# Patient Record
Sex: Female | Born: 1975
Health system: Southern US, Community
[De-identification: ages and names within clinical notes are randomized; demographics above are authoritative.]

## PROBLEM LIST (undated history)

## (undated) ENCOUNTER — Emergency Department (HOSPITAL_COMMUNITY): Discharge status: Absent without leave | Payer: Self-pay

## (undated) DIAGNOSIS — F329 Major depressive disorder, single episode, unspecified: Secondary | ICD-10-CM

## (undated) DIAGNOSIS — K5909 Other constipation: Secondary | ICD-10-CM

## (undated) DIAGNOSIS — F909 Attention-deficit hyperactivity disorder, unspecified type: Secondary | ICD-10-CM

## (undated) DIAGNOSIS — H8109 Meniere's disease, unspecified ear: Secondary | ICD-10-CM

## (undated) DIAGNOSIS — E559 Vitamin D deficiency, unspecified: Secondary | ICD-10-CM

## (undated) DIAGNOSIS — K589 Irritable bowel syndrome without diarrhea: Secondary | ICD-10-CM

## (undated) DIAGNOSIS — F32A Depression, unspecified: Secondary | ICD-10-CM

## (undated) DIAGNOSIS — F419 Anxiety disorder, unspecified: Secondary | ICD-10-CM

## (undated) HISTORY — DX: Anxiety disorder, unspecified: F41.9

## (undated) HISTORY — DX: Irritable bowel syndrome without diarrhea: K58.9

## (undated) HISTORY — PX: DILATION AND CURETTAGE OF UTERUS: SHX78

## (undated) HISTORY — DX: Depression, unspecified: F32.A

## (undated) HISTORY — DX: Major depressive disorder, single episode, unspecified: F32.9

## (undated) HISTORY — DX: Meniere's disease, unspecified ear: H81.09

## (undated) HISTORY — PX: ABLATION: SHX5711

## (undated) HISTORY — DX: Vitamin D deficiency, unspecified: E55.9

---

## 2006-05-12 ENCOUNTER — Inpatient Hospital Stay: Payer: Self-pay

## 2009-03-26 ENCOUNTER — Observation Stay: Payer: Self-pay | Admitting: Obstetrics and Gynecology

## 2010-08-21 ENCOUNTER — Ambulatory Visit: Payer: Self-pay | Admitting: Internal Medicine

## 2010-09-07 ENCOUNTER — Inpatient Hospital Stay: Payer: Self-pay

## 2011-10-12 ENCOUNTER — Ambulatory Visit: Payer: Self-pay | Admitting: Internal Medicine

## 2011-10-12 LAB — URINALYSIS, COMPLETE
Bilirubin,UR: NEGATIVE
Blood: NEGATIVE
Ketone: NEGATIVE
Leukocyte Esterase: NEGATIVE
Nitrite: NEGATIVE
Ph: 7.5 (ref 4.5–8.0)
RBC,UR: NONE SEEN /HPF (ref 0–5)

## 2011-10-13 LAB — URINE CULTURE

## 2013-11-16 ENCOUNTER — Observation Stay: Payer: Self-pay | Admitting: Obstetrics and Gynecology

## 2013-11-21 ENCOUNTER — Inpatient Hospital Stay: Payer: Self-pay | Admitting: Obstetrics and Gynecology

## 2013-11-21 LAB — CBC WITH DIFFERENTIAL/PLATELET
COMMENT - H1-COM2: NORMAL
Comment - H1-Com1: NORMAL
HCT: 39.5 % (ref 35.0–47.0)
HGB: 13.3 g/dL (ref 12.0–16.0)
Lymphocytes: 24 %
MCH: 29.6 pg (ref 26.0–34.0)
MCHC: 33.6 g/dL (ref 32.0–36.0)
MCV: 88 fL (ref 80–100)
MONOS PCT: 6 %
Platelet: 121 10*3/uL — ABNORMAL LOW (ref 150–440)
RBC: 4.49 10*6/uL (ref 3.80–5.20)
RDW: 13.5 % (ref 11.5–14.5)
Segmented Neutrophils: 70 %
WBC: 8.8 10*3/uL (ref 3.6–11.0)

## 2013-11-22 LAB — HEMATOCRIT: HCT: 38 % (ref 35.0–47.0)

## 2014-12-03 NOTE — H&P (Signed)
L&D Evaluation:  History:  HPI 39 yo [id number] at 7673w3d by D=7wk US derived EDC of 11/20/13 presenting with left lower extremity swelling for the last week.  No trauma to the affected extremity.  No pain, but numbness.  No prior history of DVT.    +FM, no LOF, no VB, no ctx   Presents with left lower extremity swelling   Patient's Medical History No Chronic Illness   Patient's Surgical History D&C   Medications Pre Natal Vitamins   Allergies NKDA   Social History none   Family History Non-Contributory   ROS:  ROS All systems were reviewed.  HEENT, CNS, GI, GU, Respiratory, CV, Renal and Musculoskeletal systems were found to be normal.   Exam:  Vital Signs stable   Urine Protein not completed   General no apparent distress   Edema 1+   FHT normal rate with no decels, reactive NST   Ucx absent   Other Symmetric BLE swelling, +1, negative homans sign bilaterally, some superficial varicosities RLE, no cords bilateral lower extremities   Impression:  Impression 39 yo [id number] at 4273w3d with LLE swelling   Plan:  Comments 1) LLE swelling - low concern for DVT on exam but will obtain lower extremity dopplers  2) Fetus - category I tracing      - 58lbs weight gain this pregnancy      - pelvis tested to 7lbs 11oz  3) A negative / ABSC neg / RI / VZI / HBsAg neg / HIV neg / RPR NR / XY no aneuoploid on cell free fetal DNA / 1-hr 109 / GBS negative     - rhogam 08/31/13  4) TDAP 09/14/13  5) Disposition - scheduled for IOL on 11/21/13, home if doppler negative   Electronic Signatures for Addendum Section:  Lorrene ReidStaebler, Le Faulcon M (MD) (Signed Addendum 24-Apr-15 11:39)  lower extremity doppler negative discharge home with knee high TED hose   Electronic Signatures: Lorrene ReidStaebler, Arrie Zuercher M (MD)  (Signed 24-Apr-15 11:07)  Authored: L&D Evaluation   Last Updated: 24-Apr-15 11:39 by Lorrene ReidStaebler, Raymonde Hamblin M (MD)

## 2014-12-03 NOTE — H&P (Signed)
L&D Evaluation:  History:   HPI 39 yo G5 P2022 at 40.2 weeks with EDD of 2/12 per LMP & 1st trimester US. For IOL d/t postdates. Stated cervix was closed today in the office. Orders sent for cervidil this evening and pitocin in am. [facility] notable for h/o marginal previa which had resolved by 28 week US, Rh negative - received Rhogam at 28 weeks, and AMA. Pt declined genetic screening.    Patient's Medical History No Chronic Illness    Patient's Surgical History none    Medications Pre Natal Vitamins    Allergies NKDA    Social History none    Family History Non-Contributory   ROS:    General normal    HEENT nasal congestion    CNS normal    GI normal    GU normal    Resp normal    CV normal    Renal normal    MS normal   Exam:   Vital Signs stable    Urine Protein not completed    General no apparent distress    Mental Status clear    Chest clear    Heart normal sinus rhythm    Abdomen gravid, non-tender    Estimated Fetal Weight Average for gestational age    Fetal Position vertex    Edema no edema  varicosities to left leg    Reflexes 1+     Pelvic no external lesions    Mebranes Intact    FHT normal rate with no decels    Fetal Heart Rate 135    Ucx irregular    Skin dry   Impression:   Impression reactive NST   Plan:   Plan EFM/NST, Cervidil overnight. Will also order Sudafed for nasal congestion   Electronic Signatures: Vella KohlerBrothers, Jacqueline Howell (CNM)  (Signed 13-Feb-12 20:33)  Entered: L&D Evaluation,  Authored: L&D Evaluation  Last Updated: 13-Feb-12 20:33

## 2015-01-13 ENCOUNTER — Other Ambulatory Visit: Payer: Self-pay | Admitting: Family Medicine

## 2015-01-13 DIAGNOSIS — F909 Attention-deficit hyperactivity disorder, unspecified type: Secondary | ICD-10-CM

## 2015-01-13 DIAGNOSIS — F419 Anxiety disorder, unspecified: Secondary | ICD-10-CM

## 2015-01-13 MED ORDER — CLONAZEPAM 1 MG PO TABS: 1.0000 mg | ORAL_TABLET | Freq: Every day | ORAL | Status: DC

## 2015-01-13 MED ORDER — AMPHETAMINE-DEXTROAMPHET ER 10 MG PO CP24: 10.0000 mg | ORAL_CAPSULE | Freq: Every day | ORAL | Status: DC

## 2015-01-13 MED ORDER — AMPHETAMINE-DEXTROAMPHETAMINE 10 MG PO TABS: 10.0000 mg | ORAL_TABLET | Freq: Every day | ORAL | Status: DC

## 2015-02-07 ENCOUNTER — Other Ambulatory Visit: Payer: Self-pay | Admitting: Neurosurgery

## 2015-02-07 DIAGNOSIS — M5416 Radiculopathy, lumbar region: Secondary | ICD-10-CM

## 2015-02-11 ENCOUNTER — Other Ambulatory Visit: Payer: Self-pay | Admitting: Family Medicine

## 2015-02-11 MED ORDER — AMPHETAMINE-DEXTROAMPHETAMINE 10 MG PO TABS: 10.0000 mg | ORAL_TABLET | Freq: Every day | ORAL | Status: DC

## 2015-02-11 MED ORDER — CLONAZEPAM 1 MG PO TABS: 1.0000 mg | ORAL_TABLET | Freq: Every day | ORAL | Status: DC

## 2015-02-11 MED ORDER — AMPHETAMINE-DEXTROAMPHET ER 10 MG PO CP24: 10.0000 mg | ORAL_CAPSULE | Freq: Every day | ORAL | Status: DC

## 2015-02-25 ENCOUNTER — Ambulatory Visit
Admission: RE | Admit: 2015-02-25 | Discharge: 2015-02-25 | Disposition: A | Discharge status: Home / Self Care | Payer: BLUE CROSS/BLUE SHIELD | Source: Ambulatory Visit | Attending: Neurosurgery | Admitting: Neurosurgery

## 2015-02-25 DIAGNOSIS — M5416 Radiculopathy, lumbar region: Secondary | ICD-10-CM | POA: Insufficient documentation

## 2015-02-25 DIAGNOSIS — M5136 Other intervertebral disc degeneration, lumbar region: Secondary | ICD-10-CM | POA: Insufficient documentation

## 2015-03-10 ENCOUNTER — Other Ambulatory Visit: Payer: Self-pay | Admitting: Family Medicine

## 2015-03-10 MED ORDER — AMPHETAMINE-DEXTROAMPHET ER 10 MG PO CP24: 10.0000 mg | ORAL_CAPSULE | Freq: Every day | ORAL | Status: DC

## 2015-03-10 MED ORDER — CLONAZEPAM 1 MG PO TABS: 1.0000 mg | ORAL_TABLET | Freq: Every day | ORAL | Status: DC

## 2015-03-10 MED ORDER — AMPHETAMINE-DEXTROAMPHETAMINE 10 MG PO TABS: 10.0000 mg | ORAL_TABLET | Freq: Every day | ORAL | Status: DC

## 2015-03-27 ENCOUNTER — Other Ambulatory Visit: Payer: Self-pay | Admitting: Family Medicine

## 2015-03-27 MED ORDER — AMPHETAMINE-DEXTROAMPHETAMINE 10 MG PO TABS: 10.0000 mg | ORAL_TABLET | Freq: Every day | ORAL | Status: DC

## 2015-03-27 MED ORDER — CLONAZEPAM 1 MG PO TABS: 1.0000 mg | ORAL_TABLET | Freq: Every day | ORAL | Status: DC

## 2015-03-27 MED ORDER — AMPHETAMINE-DEXTROAMPHET ER 10 MG PO CP24: 10.0000 mg | ORAL_CAPSULE | Freq: Every day | ORAL | Status: DC

## 2015-04-29 ENCOUNTER — Ambulatory Visit (INDEPENDENT_AMBULATORY_CARE_PROVIDER_SITE_OTHER): Payer: BLUE CROSS/BLUE SHIELD

## 2015-04-29 DIAGNOSIS — Z23 Encounter for immunization: Secondary | ICD-10-CM | POA: Diagnosis not present

## 2015-05-05 ENCOUNTER — Other Ambulatory Visit: Payer: Self-pay | Admitting: Family Medicine

## 2015-05-05 MED ORDER — AMPHETAMINE-DEXTROAMPHETAMINE 10 MG PO TABS: 10.0000 mg | ORAL_TABLET | Freq: Every day | ORAL | Status: DC

## 2015-05-05 MED ORDER — CLONAZEPAM 1 MG PO TABS: 1.0000 mg | ORAL_TABLET | Freq: Every day | ORAL | Status: DC

## 2015-05-05 MED ORDER — AMPHETAMINE-DEXTROAMPHET ER 10 MG PO CP24: 10.0000 mg | ORAL_CAPSULE | Freq: Every day | ORAL | Status: DC

## 2015-06-03 ENCOUNTER — Encounter: Payer: Self-pay | Admitting: Family Medicine

## 2015-06-03 ENCOUNTER — Other Ambulatory Visit: Payer: Self-pay | Admitting: Family Medicine

## 2015-06-03 ENCOUNTER — Ambulatory Visit (INDEPENDENT_AMBULATORY_CARE_PROVIDER_SITE_OTHER): Payer: BLUE CROSS/BLUE SHIELD | Admitting: Family Medicine

## 2015-06-03 VITALS — BP 108/72 | HR 64 | Temp 98.0°F | Ht 66.2 in | Wt 129.0 lb

## 2015-06-03 DIAGNOSIS — F909 Attention-deficit hyperactivity disorder, unspecified type: Secondary | ICD-10-CM

## 2015-06-03 DIAGNOSIS — M545 Low back pain, unspecified: Secondary | ICD-10-CM

## 2015-06-03 DIAGNOSIS — F419 Anxiety disorder, unspecified: Secondary | ICD-10-CM | POA: Diagnosis not present

## 2015-06-03 MED ORDER — AMPHETAMINE-DEXTROAMPHET ER 10 MG PO CP24: 10.0000 mg | ORAL_CAPSULE | Freq: Every day | ORAL | Status: DC

## 2015-06-03 MED ORDER — LIDOCAINE 5 % EX PTCH: 1.0000 | MEDICATED_PATCH | CUTANEOUS | Status: DC

## 2015-06-03 MED ORDER — CLONAZEPAM 1 MG PO TABS: 1.0000 mg | ORAL_TABLET | Freq: Every day | ORAL | Status: DC

## 2015-06-03 MED ORDER — AMPHETAMINE-DEXTROAMPHETAMINE 10 MG PO TABS: 10.0000 mg | ORAL_TABLET | Freq: Every day | ORAL | Status: DC

## 2015-06-03 NOTE — Progress Notes (Signed)
BP 108/72 mmHg  Pulse 64  Temp(Src) 98 F (36.7 C)  Ht 5' 6.2" (1.681 m)  Wt 129 lb (58.514 kg)  BMI 20.71 kg/m2  SpO2 99%  LMP 05/13/2015 (Approximate)   Subjective:    Patient ID: Jacqueline Howell, female    DOB: [birth year], 39 y.o.   MRN: [id number]  HPI: Jacqueline Howell is a 39 y.o. female  Chief Complaint  Patient presents with  . Medication Refill   Patient for recheck ADHD doing well with medications occasionally doesn't take any Klonopin occasionally will take 2 depending on the week and kids. Patient also has had some low back pain discomfort is treated by neurosurgery with MRI showing some mild degenerative changes Patient was given lidocaine patch which helps Tylenol arthritis bothers her stomach. Relevant past medical, surgical, family and social history reviewed and updated as indicated. Interim medical history since our last visit reviewed. Allergies and medications reviewed and updated.  Review of Systems  Constitutional: Negative.   Respiratory: Negative.   Cardiovascular: Negative.     Per HPI unless specifically indicated above     Objective:    BP 108/72 mmHg  Pulse 64  Temp(Src) 98 F (36.7 C)  Ht 5' 6.2" (1.681 m)  Wt 129 lb (58.514 kg)  BMI 20.71 kg/m2  SpO2 99%  LMP 05/13/2015 (Approximate)  Wt Readings from Last 3 Encounters:  06/03/15 129 lb (58.514 kg)  12/04/14 132 lb (59.875 kg)  02/25/15 130 lb (58.968 kg)    Physical Exam  Constitutional: She is oriented to person, place, and time. She appears well-developed and well-nourished. No distress.  HENT:  Head: Normocephalic and atraumatic.  Right Ear: Hearing normal.  Left Ear: Hearing normal.  Nose: Nose normal.  Eyes: Conjunctivae and lids are normal. Right eye exhibits no discharge. Left eye exhibits no discharge. No scleral icterus.  Cardiovascular: Normal rate, regular rhythm and normal heart sounds.   Pulmonary/Chest: Effort normal and breath sounds normal. No respiratory  distress.  Musculoskeletal: Normal range of motion.  Neurological: She is alert and oriented to person, place, and time.  Skin: Skin is intact. No rash noted.  Psychiatric: She has a normal mood and affect. Her speech is normal and behavior is normal. Judgment and thought content normal. Cognition and memory are normal.    Results for orders placed or performed in visit on 11/21/13  CBC with Differential/Platelet  Result Value Ref Range   WBC 8.8 3.6-11.0 x10 3/mm 3   RBC 4.49 3.80-5.20 X10 6/mm 3   HGB 13.3 12.0-16.0 g/dL   HCT 40.939.5 81.1-91.435.0-47.0 %   MCV 88 80-100 fL   MCH 29.6 26.0-34.0 pg   MCHC 33.6 32.0-36.0 g/dL   RDW 78.213.5 95.6-21.311.5-14.5 %   Platelet 121 (L) 150-440 x10 3/mm 3   Segmented Neutrophils 70 %   Lymphocytes 24 %   Monocytes 6 %   Comment - H1-Com1 RBCs APPEAR NORMAL    Comment - H1-Com2 NORMAL PLT MORPHOLGY   Hematocrit  Result Value Ref Range   HCT 38.0 35.0-47.0 %      Assessment & Plan:   Problem List Items Addressed This Visit      Other   Low back pain    Discuss back pain care and treatment will continue lidocaine patch intermittently as that is helped Discussed posture and exercise      Chronic anxiety (Chronic)    Stable with intermittent clonazepam use      ADHD (attention deficit hyperactivity  disorder) - Primary (Chronic)    Patient well controlled on current medications          Follow up plan: Return in about 3 months (around 09/03/2015) for Recheck medicines.

## 2015-06-03 NOTE — Assessment & Plan Note (Signed)
Patient well controlled on current medications

## 2015-06-03 NOTE — Assessment & Plan Note (Signed)
Stable with intermittent clonazepam use

## 2015-06-03 NOTE — Assessment & Plan Note (Signed)
Discuss back pain care and treatment will continue lidocaine patch intermittently as that is helped Discussed posture and exercise

## 2015-06-18 ENCOUNTER — Other Ambulatory Visit: Payer: Self-pay | Admitting: Family Medicine

## 2015-06-18 MED ORDER — AMPHETAMINE-DEXTROAMPHETAMINE 10 MG PO TABS: 10.0000 mg | ORAL_TABLET | Freq: Every day | ORAL | Status: DC

## 2015-06-18 MED ORDER — CLONAZEPAM 1 MG PO TABS: 1.0000 mg | ORAL_TABLET | Freq: Every day | ORAL | Status: DC

## 2015-06-18 MED ORDER — AMPHETAMINE-DEXTROAMPHET ER 10 MG PO CP24: 10.0000 mg | ORAL_CAPSULE | Freq: Every day | ORAL | Status: DC

## 2015-07-09 ENCOUNTER — Ambulatory Visit: Payer: BLUE CROSS/BLUE SHIELD | Admitting: Family Medicine

## 2015-07-30 ENCOUNTER — Other Ambulatory Visit: Payer: Self-pay | Admitting: Family Medicine

## 2015-07-30 MED ORDER — AMPHETAMINE-DEXTROAMPHETAMINE 10 MG PO TABS: 10.0000 mg | ORAL_TABLET | Freq: Every day | ORAL | Status: DC

## 2015-07-30 MED ORDER — AMPHETAMINE-DEXTROAMPHET ER 10 MG PO CP24: 10.0000 mg | ORAL_CAPSULE | Freq: Every day | ORAL | Status: DC

## 2015-08-25 ENCOUNTER — Other Ambulatory Visit: Payer: Self-pay | Admitting: Family Medicine

## 2015-08-25 MED ORDER — AMPHETAMINE-DEXTROAMPHETAMINE 10 MG PO TABS: 10.0000 mg | ORAL_TABLET | Freq: Every day | ORAL | Status: DC

## 2015-08-25 MED ORDER — AMPHETAMINE-DEXTROAMPHET ER 10 MG PO CP24: 10.0000 mg | ORAL_CAPSULE | Freq: Every day | ORAL | Status: DC

## 2015-08-25 MED ORDER — CLONAZEPAM 1 MG PO TABS: 1.0000 mg | ORAL_TABLET | Freq: Every day | ORAL | Status: DC

## 2015-08-28 ENCOUNTER — Ambulatory Visit: Payer: BLUE CROSS/BLUE SHIELD | Admitting: Family Medicine

## 2015-09-01 ENCOUNTER — Encounter: Payer: Self-pay | Admitting: Family Medicine

## 2015-09-01 ENCOUNTER — Ambulatory Visit (INDEPENDENT_AMBULATORY_CARE_PROVIDER_SITE_OTHER): Payer: BLUE CROSS/BLUE SHIELD | Admitting: Family Medicine

## 2015-09-01 VITALS — BP 109/69 | HR 66 | Temp 99.1°F | Ht 66.2 in | Wt 133.0 lb

## 2015-09-01 DIAGNOSIS — F909 Attention-deficit hyperactivity disorder, unspecified type: Secondary | ICD-10-CM | POA: Diagnosis not present

## 2015-09-01 DIAGNOSIS — F419 Anxiety disorder, unspecified: Secondary | ICD-10-CM | POA: Diagnosis not present

## 2015-09-01 NOTE — Progress Notes (Signed)
BP 109/69 mmHg  Pulse 66  Temp(Src) 99.1 F (37.3 C)  Ht 5' 6.2" (1.681 m)  Wt 133 lb (60.328 kg)  BMI 21.35 kg/m2  SpO2 97%  LMP 08/25/2015 (Approximate)   Subjective:    Patient ID: Jacqueline Howell, female    DOB: 1976-07-21, 40 y.o.   MRN: [id number]  HPI: Jacqueline Howell is a 40 y.o. female  Chief Complaint  Patient presents with  . ADHD    med refill   patient all in all doing well with a lot of family stress with family illness which is all getting on the mend Patient and medications as were lost in child's use of a bag and they were in for throwing up. Patient's done okay Anxiety stable and improving with children getting better and has been surgery recovery ADHD has been stable when taking medications  Relevant past medical, surgical, family and social history reviewed and updated as indicated. Interim medical history since our last visit reviewed. Allergies and medications reviewed and updated.  Review of Systems  Constitutional: Negative.   Respiratory: Negative.   Cardiovascular: Negative.     Per HPI unless specifically indicated above     Objective:    BP 109/69 mmHg  Pulse 66  Temp(Src) 99.1 F (37.3 C)  Ht 5' 6.2" (1.681 m)  Wt 133 lb (60.328 kg)  BMI 21.35 kg/m2  SpO2 97%  LMP 08/25/2015 (Approximate)  Wt Readings from Last 3 Encounters:  09/01/15 133 lb (60.328 kg)  06/03/15 129 lb (58.514 kg)  12/04/14 132 lb (59.875 kg)    Physical Exam  Constitutional: She is oriented to person, place, and time. She appears well-developed and well-nourished. No distress.  HENT:  Head: Normocephalic and atraumatic.  Right Ear: Hearing normal.  Left Ear: Hearing normal.  Nose: Nose normal.  Eyes: Conjunctivae and lids are normal. Right eye exhibits no discharge. Left eye exhibits no discharge. No scleral icterus.  Cardiovascular: Normal rate, regular rhythm and normal heart sounds.   Pulmonary/Chest: Effort normal. No respiratory distress.   Musculoskeletal: Normal range of motion.  Neurological: She is alert and oriented to person, place, and time.  Skin: Skin is intact. No rash noted.  Psychiatric: She has a normal mood and affect. Her speech is normal and behavior is normal. Judgment and thought content normal. Cognition and memory are normal.    Results for orders placed or performed in visit on 11/21/13  CBC with Differential/Platelet  Result Value Ref Range   WBC 8.8 3.6-11.0 x10 3/mm 3   RBC 4.49 3.80-5.20 X10 6/mm 3   HGB 13.3 12.0-16.0 g/dL   HCT 40.9 81.1-91.4 %   MCV 88 80-100 fL   MCH 29.6 26.0-34.0 pg   MCHC 33.6 32.0-36.0 g/dL   RDW 78.2 95.6-21.3 %   Platelet 121 (L) 150-440 x10 3/mm 3   Segmented Neutrophils 70 %   Lymphocytes 24 %   Monocytes 6 %   Comment - H1-Com1 RBCs APPEAR NORMAL    Comment - H1-Com2 NORMAL PLT MORPHOLGY   Hematocrit  Result Value Ref Range   HCT 38.0 35.0-47.0 %      Assessment & Plan:   Problem List Items Addressed This Visit      Other   Chronic anxiety - Primary (Chronic)    The current medical regimen is effective;  continue present plan and medications.       ADHD (attention deficit hyperactivity disorder) (Chronic)    The current medical regimen is  effective;  continue present plan and medications.           Follow up plan: Return in about 3 months (around 11/29/2015) for med check`.

## 2015-09-01 NOTE — Assessment & Plan Note (Signed)
The current medical regimen is effective;  continue present plan and medications.  

## 2015-09-16 ENCOUNTER — Other Ambulatory Visit: Payer: Self-pay | Admitting: Family Medicine

## 2015-09-16 MED ORDER — AMPHETAMINE-DEXTROAMPHETAMINE 10 MG PO TABS: 10.0000 mg | ORAL_TABLET | Freq: Every day | ORAL | Status: DC

## 2015-09-16 MED ORDER — AMPHETAMINE-DEXTROAMPHET ER 10 MG PO CP24
10.0000 mg | ORAL_CAPSULE | Freq: Every day | ORAL | Status: DC
Start: 2015-09-16 — End: 2015-10-20

## 2015-09-16 MED ORDER — CLONAZEPAM 1 MG PO TABS: 1.0000 mg | ORAL_TABLET | Freq: Every day | ORAL | Status: DC

## 2015-10-20 ENCOUNTER — Other Ambulatory Visit: Payer: Self-pay | Admitting: Family Medicine

## 2015-10-20 MED ORDER — CLONAZEPAM 1 MG PO TABS: 1.0000 mg | ORAL_TABLET | Freq: Every day | ORAL | Status: DC

## 2015-10-20 MED ORDER — AMPHETAMINE-DEXTROAMPHET ER 10 MG PO CP24: 10.0000 mg | ORAL_CAPSULE | Freq: Every day | ORAL | Status: DC

## 2015-10-20 MED ORDER — AMPHETAMINE-DEXTROAMPHETAMINE 10 MG PO TABS: 10.0000 mg | ORAL_TABLET | Freq: Every day | ORAL | Status: DC

## 2015-11-07 ENCOUNTER — Telehealth: Payer: Self-pay | Admitting: Family Medicine

## 2015-11-07 DIAGNOSIS — M545 Low back pain, unspecified: Secondary | ICD-10-CM

## 2015-11-07 NOTE — Telephone Encounter (Signed)
Routing to provider  

## 2015-11-07 NOTE — Telephone Encounter (Signed)
Pt called stated she has arthritis in her lower back and it hurts severely. Wants to know if a cortosone injection may help relieve the pain. Please call pt back and advise. Thanks.

## 2015-11-10 ENCOUNTER — Other Ambulatory Visit: Payer: Self-pay | Admitting: Family Medicine

## 2015-11-10 MED ORDER — CLONAZEPAM 1 MG PO TABS: 1.0000 mg | ORAL_TABLET | Freq: Every day | ORAL | Status: DC

## 2015-11-10 MED ORDER — AMPHETAMINE-DEXTROAMPHETAMINE 10 MG PO TABS: 10.0000 mg | ORAL_TABLET | Freq: Every day | ORAL | Status: DC

## 2015-11-10 MED ORDER — AMPHETAMINE-DEXTROAMPHET ER 10 MG PO CP24: 10.0000 mg | ORAL_CAPSULE | Freq: Every day | ORAL | Status: DC

## 2015-11-10 NOTE — Telephone Encounter (Signed)
Called, no answer. Will try later.  

## 2015-11-10 NOTE — Telephone Encounter (Signed)
Call pt 

## 2015-11-20 ENCOUNTER — Ambulatory Visit (INDEPENDENT_AMBULATORY_CARE_PROVIDER_SITE_OTHER): Payer: BLUE CROSS/BLUE SHIELD | Admitting: Family Medicine

## 2015-11-20 ENCOUNTER — Ambulatory Visit: Payer: BLUE CROSS/BLUE SHIELD | Admitting: Family Medicine

## 2015-11-20 ENCOUNTER — Encounter: Payer: Self-pay | Admitting: Family Medicine

## 2015-11-20 VITALS — BP 110/73 | HR 70 | Temp 98.9°F | Ht 66.2 in | Wt 138.0 lb

## 2015-11-20 DIAGNOSIS — F909 Attention-deficit hyperactivity disorder, unspecified type: Secondary | ICD-10-CM | POA: Diagnosis not present

## 2015-11-20 DIAGNOSIS — M5137 Other intervertebral disc degeneration, lumbosacral region: Secondary | ICD-10-CM | POA: Diagnosis not present

## 2015-11-20 DIAGNOSIS — M5136 Other intervertebral disc degeneration, lumbar region: Secondary | ICD-10-CM | POA: Diagnosis not present

## 2015-11-20 DIAGNOSIS — F419 Anxiety disorder, unspecified: Secondary | ICD-10-CM

## 2015-11-20 MED ORDER — TRIAMCINOLONE ACETONIDE 0.1 % EX CREA: 1.0000 "application " | TOPICAL_CREAM | Freq: Two times a day (BID) | CUTANEOUS | Status: DC

## 2015-11-20 NOTE — Progress Notes (Signed)
BP 110/73 mmHg  Pulse 70  Temp(Src) 98.9 F (37.2 C)  Ht 5' 6.2" (1.681 m)  Wt 138 lb (62.596 kg)  BMI 22.15 kg/m2  SpO2 95%  LMP 11/13/2015 (Approximate)   Subjective:    Patient ID: Jacqueline Howell, female    DOB: [birth year], 40 y.o.   MRN: [id number]  HPI: Jacqueline Howell is a 41 y.o. female  Chief Complaint  Patient presents with  . Depression  . Rash    on back, started yesterday  Patient with patch itching rash on her back started yesterday with working out wearing a polyester shirt with pressure on her low back in area of rash. Now itching Patient also noticed with ADHD taking 10 mg X or doesn't seem to do as well has been on this dose for years. Reviewed chart patient was started by psychiatrist in [place] years ago.  Took an extra Adderall 10 mg for a total of 20 and did better wants to consider trying this medication increase.   Relevant past medical, surgical, family and social history reviewed and updated as indicated. Interim medical history since our last visit reviewed. Allergies and medications reviewed and updated.  Review of Systems  Constitutional: Negative.   Respiratory: Negative.   Cardiovascular: Negative.     Per HPI unless specifically indicated above     Objective:    BP 110/73 mmHg  Pulse 70  Temp(Src) 98.9 F (37.2 C)  Ht 5' 6.2" (1.681 m)  Wt 138 lb (62.596 kg)  BMI 22.15 kg/m2  SpO2 95%  LMP 11/13/2015 (Approximate)  Wt Readings from Last 3 Encounters:  11/20/15 138 lb (62.596 kg)  09/01/15 133 lb (60.328 kg)  06/03/15 129 lb (58.514 kg)    Physical Exam  Constitutional: She is oriented to person, place, and time. She appears well-developed and well-nourished. No distress.  HENT:  Head: Normocephalic and atraumatic.  Right Ear: Hearing normal.  Left Ear: Hearing normal.  Nose: Nose normal.  Eyes: Conjunctivae and lids are normal. Right eye exhibits no discharge. Left eye exhibits no discharge. No scleral icterus.   Cardiovascular: Normal rate, regular rhythm and normal heart sounds.   Pulmonary/Chest: Effort normal and breath sounds normal. No respiratory distress.  Musculoskeletal: Normal range of motion.  Neurological: She is alert and oriented to person, place, and time.  Skin: Skin is intact. No rash noted.  Psychiatric: She has a normal mood and affect. Her speech is normal and behavior is normal. Judgment and thought content normal. Cognition and memory are normal.    Results for orders placed or performed in visit on 11/21/13  CBC with Differential/Platelet  Result Value Ref Range   WBC 8.8 3.6-11.0 x10 3/mm 3   RBC 4.49 3.80-5.20 X10 6/mm 3   HGB 13.3 12.0-16.0 g/dL   HCT 40.9 81.1-91.4 %   MCV 88 80-100 fL   MCH 29.6 26.0-34.0 pg   MCHC 33.6 32.0-36.0 g/dL   RDW 78.2 95.6-21.3 %   Platelet 121 (L) 150-440 x10 3/mm 3   Segmented Neutrophils 70 %   Lymphocytes 24 %   Monocytes 6 %   Comment - H1-Com1 RBCs APPEAR NORMAL    Comment - H1-Com2 NORMAL PLT MORPHOLGY   Hematocrit  Result Value Ref Range   HCT 38.0 35.0-47.0 %      Assessment & Plan:   Problem List Items Addressed This Visit      Other   ADHD (attention deficit hyperactivity disorder) - Primary (Chronic)  ADHD sounds like poor control will do Tova testing on 10 mg around 8:00 in the morning patient will take an extra Adderall as soon as she finishes her test and follow-up for another Tova test approximately 1-1/2-2 hours later. We will then review results and determine proper dosing.      Chronic anxiety (Chronic)    Uses clonazepam mostly every day still has great deal of stress in her life          Follow up plan: Return in about 3 months (around 02/19/2016) for And follow-up Tova test in the next week or so..Marland Kitchen

## 2015-11-20 NOTE — Assessment & Plan Note (Signed)
ADHD sounds like poor control will do Tova testing on 10 mg around 8:00 in the morning patient will take an extra Adderall as soon as she finishes her test and follow-up for another Tova test approximately 1-1/2-2 hours later. We will then review results and determine proper dosing.

## 2015-11-20 NOTE — Assessment & Plan Note (Signed)
Uses clonazepam mostly every day still has great deal of stress in her life

## 2015-11-25 DIAGNOSIS — M5136 Other intervertebral disc degeneration, lumbar region: Secondary | ICD-10-CM | POA: Diagnosis not present

## 2015-11-27 ENCOUNTER — Ambulatory Visit (INDEPENDENT_AMBULATORY_CARE_PROVIDER_SITE_OTHER): Payer: BLUE CROSS/BLUE SHIELD | Admitting: Family Medicine

## 2015-11-27 ENCOUNTER — Encounter: Payer: Self-pay | Admitting: Family Medicine

## 2015-11-27 VITALS — BP 119/80 | HR 66 | Ht 66.0 in | Wt 138.8 lb

## 2015-11-27 DIAGNOSIS — F909 Attention-deficit hyperactivity disorder, unspecified type: Secondary | ICD-10-CM | POA: Diagnosis not present

## 2015-11-27 DIAGNOSIS — M545 Low back pain, unspecified: Secondary | ICD-10-CM

## 2015-11-27 DIAGNOSIS — Z1339 Encounter for screening examination for other mental health and behavioral disorders: Secondary | ICD-10-CM

## 2015-11-27 DIAGNOSIS — Z1389 Encounter for screening for other disorder: Principal | ICD-10-CM

## 2015-11-27 MED ORDER — AMPHETAMINE-DEXTROAMPHET ER 20 MG PO CP24: 20.0000 mg | ORAL_CAPSULE | ORAL | Status: DC

## 2015-11-27 NOTE — Assessment & Plan Note (Signed)
Patient's been to [facility] was given a TENS unit which is helped a great deal and controlling her back pain.

## 2015-11-27 NOTE — Assessment & Plan Note (Signed)
Repeat Tova test 10 mg with a ADHD score of -8.74 see copy for details Repeat Tova test on 20 mg ADHD score of +1.72 with normalization of all areas except commission errors quarter 3 and 4 see copy for details Will discontinue Adderall XR 10  place on Adderall XR 20.

## 2015-11-27 NOTE — Progress Notes (Signed)
   BP 119/80 mmHg  Pulse 66  Ht 5\' 6"  (1.676 m)  Wt 138 lb 12.8 oz (62.959 kg)  BMI 22.41 kg/m2  LMP 11/13/2015 (Approximate)   Subjective:    Patient ID: Jacqueline Howell, female    DOB: [birth year], 40 y.o.   MRN: [id number]  HPI: Jacqueline Howell is a [age] y.o. female  Chief Complaint  Patient presents with  . TOVA  Repeat Tova test on 10 mg and 20 mg  Relevant past medical, surgical, family and social history reviewed and updated as indicated. Interim medical history since our last visit reviewed. Allergies and medications reviewed and updated.  Review of Systems  Constitutional: Negative.   Respiratory: Negative.   Cardiovascular: Negative.     Per HPI unless specifically indicated above     Objective:    BP 119/80 mmHg  Pulse 66  Ht 5\' 6"  (1.676 m)  Wt 138 lb 12.8 oz (62.959 kg)  BMI 22.41 kg/m2  LMP 11/13/2015 (Approximate)  Wt Readings from Last 3 Encounters:  11/27/15 138 lb 12.8 oz (62.959 kg)  11/20/15 138 lb (62.596 kg)  09/01/15 133 lb (60.328 kg)    Physical Exam  Constitutional: She is oriented to person, place, and time. She appears well-developed and well-nourished. No distress.  HENT:  Head: Normocephalic and atraumatic.  Right Ear: Hearing normal.  Left Ear: Hearing normal.  Nose: Nose normal.  Eyes: Conjunctivae and lids are normal. Right eye exhibits no discharge. Left eye exhibits no discharge. No scleral icterus.  Pulmonary/Chest: Effort normal. No respiratory distress.  Musculoskeletal: Normal range of motion.  Neurological: She is alert and oriented to person, place, and time.  Skin: Skin is intact. No rash noted.  Psychiatric: She has a normal mood and affect. Her speech is normal and behavior is normal. Judgment and thought content normal. Cognition and memory are normal.    Results for orders placed or performed in visit on 11/21/13  CBC with Differential/Platelet  Result Value Ref Range   WBC 8.8 3.6-11.0 x10 3/mm 3   RBC 4.49  3.80-5.20 X10 6/mm 3   HGB 13.3 12.0-16.0 g/dL   HCT 40.939.5 81.1-91.435.0-47.0 %   MCV 88 80-100 fL   MCH 29.6 26.0-34.0 pg   MCHC 33.6 32.0-36.0 g/dL   RDW 78.213.5 95.6-21.311.5-14.5 %   Platelet 121 (L) 150-440 x10 3/mm 3   Segmented Neutrophils 70 %   Lymphocytes 24 %   Monocytes 6 %   Comment - H1-Com1 RBCs APPEAR NORMAL    Comment - H1-Com2 NORMAL PLT MORPHOLGY   Hematocrit  Result Value Ref Range   HCT 38.0 35.0-47.0 %      Assessment & Plan:   Problem List Items Addressed This Visit      Other   ADHD (attention deficit hyperactivity disorder) - Primary (Chronic)    Repeat Tova test 10 mg with a ADHD score of -8.74 see copy for details Repeat Tova test on 20 mg ADHD score of +1.72 with normalization of all areas except commission errors quarter 3 and 4 see copy for details Will discontinue Adderall XR 10  place on Adderall XR 20.      Low back pain    Patient's been to [facility] was given a TENS unit which is helped a great deal and controlling her back pain.          Follow up plan: Return in about 6 months (around 05/29/2016) for ADHD recheck.

## 2015-11-27 NOTE — Patient Instructions (Signed)
TOVA test

## 2015-12-02 NOTE — Progress Notes (Signed)
This encounter was created in error - please disregard.

## 2015-12-08 ENCOUNTER — Other Ambulatory Visit: Payer: Self-pay | Admitting: Family Medicine

## 2015-12-16 ENCOUNTER — Other Ambulatory Visit: Payer: Self-pay | Admitting: Family Medicine

## 2015-12-16 MED ORDER — AMPHETAMINE-DEXTROAMPHET ER 20 MG PO CP24: 20.0000 mg | ORAL_CAPSULE | ORAL | Status: DC

## 2015-12-16 MED ORDER — AMPHETAMINE-DEXTROAMPHETAMINE 10 MG PO TABS: 10.0000 mg | ORAL_TABLET | Freq: Every day | ORAL | Status: DC

## 2015-12-16 MED ORDER — CLONAZEPAM 1 MG PO TABS: 1.0000 mg | ORAL_TABLET | Freq: Every day | ORAL | Status: DC

## 2015-12-26 DIAGNOSIS — M5136 Other intervertebral disc degeneration, lumbar region: Secondary | ICD-10-CM | POA: Diagnosis not present

## 2016-01-11 ENCOUNTER — Other Ambulatory Visit: Payer: Self-pay | Admitting: Family Medicine

## 2016-01-11 MED ORDER — AMPHETAMINE-DEXTROAMPHETAMINE 10 MG PO TABS: 10.0000 mg | ORAL_TABLET | Freq: Every day | ORAL | Status: DC

## 2016-01-11 MED ORDER — AMPHETAMINE-DEXTROAMPHET ER 20 MG PO CP24: 20.0000 mg | ORAL_CAPSULE | ORAL | Status: DC

## 2016-01-13 ENCOUNTER — Other Ambulatory Visit: Payer: Self-pay | Admitting: Family Medicine

## 2016-01-13 MED ORDER — CLONAZEPAM 1 MG PO TABS: 1.0000 mg | ORAL_TABLET | Freq: Every day | ORAL | Status: DC

## 2016-01-14 ENCOUNTER — Other Ambulatory Visit: Payer: Self-pay | Admitting: Family Medicine

## 2016-01-14 MED ORDER — AMPHETAMINE-DEXTROAMPHETAMINE 10 MG PO TABS: 10.0000 mg | ORAL_TABLET | Freq: Every day | ORAL | Status: DC

## 2016-01-14 MED ORDER — AMPHETAMINE-DEXTROAMPHET ER 20 MG PO CP24: 20.0000 mg | ORAL_CAPSULE | ORAL | Status: DC

## 2016-01-14 MED ORDER — CLONAZEPAM 1 MG PO TABS: 1.0000 mg | ORAL_TABLET | Freq: Every day | ORAL | Status: DC

## 2016-01-25 DIAGNOSIS — M5136 Other intervertebral disc degeneration, lumbar region: Secondary | ICD-10-CM | POA: Diagnosis not present

## 2016-01-26 ENCOUNTER — Ambulatory Visit
Admission: EM | Admit: 2016-01-26 | Discharge: 2016-01-26 | Disposition: A | Discharge status: Home / Self Care | Payer: BLUE CROSS/BLUE SHIELD | Attending: Family Medicine | Admitting: Family Medicine

## 2016-01-26 ENCOUNTER — Ambulatory Visit (INDEPENDENT_AMBULATORY_CARE_PROVIDER_SITE_OTHER): Payer: BLUE CROSS/BLUE SHIELD

## 2016-01-26 ENCOUNTER — Encounter: Payer: Self-pay | Admitting: *Deleted

## 2016-01-26 DIAGNOSIS — Q7649 Other congenital malformations of spine, not associated with scoliosis: Secondary | ICD-10-CM | POA: Diagnosis not present

## 2016-01-26 DIAGNOSIS — M533 Sacrococcygeal disorders, not elsewhere classified: Secondary | ICD-10-CM

## 2016-01-26 DIAGNOSIS — M545 Low back pain: Secondary | ICD-10-CM | POA: Diagnosis not present

## 2016-01-26 MED ORDER — METAXALONE 800 MG PO TABS: 800.0000 mg | ORAL_TABLET | Freq: Three times a day (TID) | ORAL | Status: DC

## 2016-01-26 MED ORDER — MELOXICAM 15 MG PO TABS: 15.0000 mg | ORAL_TABLET | Freq: Every day | ORAL | Status: DC

## 2016-01-26 MED ORDER — TRAMADOL HCL 50 MG PO TABS: 50.0000 mg | ORAL_TABLET | Freq: Two times a day (BID) | ORAL | Status: DC | PRN

## 2016-01-26 MED ORDER — KETOROLAC TROMETHAMINE 60 MG/2ML IM SOLN
60.0000 mg | Freq: Once | INTRAMUSCULAR | Status: AC
  Administered 2016-01-26: 60 mg via INTRAMUSCULAR

## 2016-01-26 NOTE — ED Provider Notes (Signed)
CSN: [id number]     Arrival date & time 01/26/16  1512 History   First MD Initiated Contact with Patient 01/26/16 1708    Nurses notes were reviewed. Chief Complaint  Patient presents with  . Back Pain  Patient reports back pain. Back pain started yesterday. She has a history of degenerative back disease from before but yesterday she was swinging the [place] weight between her legs and when she was on the first set of swings with the weight she felt a pop in the right lower back. Since she does have a history of degenerative back disease she went on and finish that set of exercise there sit-ups when she was trying to do pushups past which felt excruciating back pain..  She reports that unfortunately through the night the pain got worse discomfort continued to intensify and she's had pain in the right leg and pain going down the right leg as well. She uses her TENS unit that she used for back from before that did not help and she took some Robaxin that she had from when she had degenerative back disease and that did not help either. Otherwise just use acetaminophen and that usually treats the lower back pain.   Past history history of being treated for depression mother has thyroid disease and father with Mnire's disease. She's never smoked and she has no known drug allergies. She has 4 kids at home and states she tried to pick up her 2 year well-child she was having excruciating back pain she describes pain being 8 out of 10 at this time.   (Consider location/radiation/quality/duration/timing/severity/associated sxs/prior Treatment) Patient is a 40 y.o. female presenting with back pain. The history is provided by the patient. No language interpreter was used.  Back Pain Location:  Sacro-iliac joint Quality:  Aching, stabbing, burning and shooting Radiates to:  R thigh and R posterior upper leg Pain severity:  Moderate Pain is:  Unable to specify Onset quality:  Sudden Progression:   Worsening Chronicity:  New Context: lifting heavy objects, recent injury and twisting   Context: not MCA, not MVA, not occupational injury, not physical stress and not recent illness   Relieved by:  Nothing Ineffective treatments:  Muscle relaxants and OTC medications (tens unit) Risk factors: no hx of cancer, no lack of exercise, not obese and no steroid use     Past Medical History  Diagnosis Date  . Depression    History reviewed. No pertinent past surgical history. Family History  Problem Relation Age of Onset  . Thyroid disease Mother   . Meniere's disease Father    Social History  Substance Use Topics  . Smoking status: Never Smoker   . Smokeless tobacco: Never Used  . Alcohol Use: No   OB History    No data available     Review of Systems  Musculoskeletal: Positive for back pain.  All other systems reviewed and are negative.   Allergies  Review of patient's allergies indicates no known allergies.  Home Medications   Prior to Admission medications   Medication Sig Start Date End Date Taking? Authorizing Provider  amphetamine-dextroamphetamine (ADDERALL XR) 20 MG 24 hr capsule Take 1 capsule (20 mg total) by mouth every morning. 01/14/16  Yes Steele SizerMark A Crissman, MD  amphetamine-dextroamphetamine (ADDERALL) 10 MG tablet Take 1 tablet (10 mg total) by mouth daily. 01/14/16  Yes Steele SizerMark A Crissman, MD  clonazePAM (KLONOPIN) 1 MG tablet Take 1 tablet (1 mg total) by mouth daily. 01/14/16  Yes Steele Sizer, MD  methocarbamol (ROBAXIN) 750 MG tablet Take 750 mg by mouth 4 (four) times daily.   Yes Historical Provider, MD  cetirizine (ZYRTEC) 10 MG tablet Take 10 mg by mouth daily.    Historical Provider, MD  meloxicam (MOBIC) 15 MG tablet Take 1 tablet (15 mg total) by mouth daily. 01/26/16   Hassan Rowan, MD  metaxalone (SKELAXIN) 800 MG tablet Take 1 tablet (800 mg total) by mouth 3 (three) times daily. If not covered by insurance may switch to Norflex 100 mg twice a day do not  take with Robaxin 01/26/16   Hassan Rowan, MD  traMADol (ULTRAM) 50 MG tablet Take 1 tablet (50 mg total) by mouth every 12 (twelve) hours as needed for moderate pain or severe pain (Mostly used at night for rest). 01/26/16   Hassan Rowan, MD   Meds Ordered and Administered this Visit   Medications  ketorolac (TORADOL) injection 60 mg (60 mg Intramuscular Given 01/26/16 1726)    BP 109/76 mmHg  Pulse 70  Temp(Src) 97.7 F (36.5 C) (Oral)  Resp 16  Ht  (1.676 m)  Wt 135 lb (61.236 kg)  BMI 21.80 kg/m2  SpO2 100%  LMP 01/19/2016 (Exact Date) No data found.   Physical Exam  Constitutional: She is oriented to person, place, and time. She appears well-developed and well-nourished.  HENT:  Head: Normocephalic and atraumatic.  Eyes: Conjunctivae are normal. Pupils are equal, round, and reactive to light.  Neck: Normal range of motion.  Pulmonary/Chest: Effort normal.  Musculoskeletal: She exhibits tenderness.       Lumbar back: She exhibits tenderness and spasm.       Back:  Neurological: She is alert and oriented to person, place, and time.  Skin: Skin is warm and dry. No erythema.  Psychiatric: She has a normal mood and affect.  Vitals reviewed.   ED Course  Procedures (including critical care time)  Labs Review Labs Reviewed - No data to display  Imaging Review Dg Lumbar Spine Complete  01/26/2016  CLINICAL DATA:  Pt was using kettle bell weights yesterday in gym and began to feel pain after a pop in low back EXAM: LUMBAR SPINE - COMPLETE 4+ VIEW COMPARISON:  02/25/2015 FINDINGS: There is no evidence of lumbar spine fracture. Alignment is normal. Intervertebral disc spaces are maintained. IMPRESSION: Negative. Electronically Signed   By: Signa Kell M.D.   On: 01/26/2016 17:59   Dg Si Joints  01/26/2016  CLINICAL DATA:  Patient was Thereasa Parkin weights in [place] and felt pain after a pop in low back. EXAM: BILATERAL SACROILIAC JOINTS - 3+ VIEW COMPARISON:  None. FINDINGS: The  sacroiliac joint spaces are maintained and there is no evidence of arthropathy. No other bone abnormalities are seen. IMPRESSION: Negative. Electronically Signed   By: Signa Kell M.D.   On: 01/26/2016 17:58     Visual Acuity Review  Right Eye Distance:   Left Eye Distance:   Bilateral Distance:    Right Eye Near:   Left Eye Near:    Bilateral Near:         MDM   1. Sacroiliac joint pain   2. Sacralization    We'll place patient on Skelaxin. If Skelaxin soccer by insurance she did have Norflex twice a area and stop the Robaxin since it did not help. Was placed on Mobic 15 mg 1 tablet day and also placed on tramadol 50 mg 1 tablet 2-3 times a day as  needed when necessary for severe pain. She was also given 60 Toradol for the pain now. She declined work note since she does not work outside the home. Follow-up PCP if not better in 1-2 weeks may need to have injection of the sacral ileal joint space.  Note: This dictation was prepared with Dragon dictation along with smaller phrase technology. Any transcriptional errors that result from this process are unintentional.      Hassan RowanEugene Hanna Ra, MD 01/26/16 [id number]

## 2016-01-26 NOTE — Discharge Instructions (Signed)
Sacroiliac Joint Dysfunction Sacroiliac joint dysfunction is a condition that causes inflammation on one or both sides of the sacroiliac (SI) joint. The SI joint connects the lower part of the spine (sacrum) with the two upper portions of the pelvis (ilium). This condition causes deep aching or burning pain in the low back. In some cases, the pain may also spread into one or both buttocks or hips or spread down the legs. CAUSES This condition may be caused by:  Pregnancy. During pregnancy, extra stress is put on the SI joints because the pelvis widens.  Injury, such as:  Car accidents.  Sport-related injuries.  Work-related injuries.  Having one leg that is shorter than the other.  Conditions that affect the joints, such as:  Rheumatoid arthritis.  Gout.  Psoriatic arthritis.  Joint infection (septic arthritis). Sometimes, the cause of SI joint dysfunction is not known. SYMPTOMS Symptoms of this condition include:  Aching or burning pain in the lower back. The pain may also spread to other areas, such as:  Buttocks.  Groin.  Thighs and legs.  Muscle spasms in or around the painful areas.  Increased pain when standing, walking, running, stair climbing, bending, or lifting. DIAGNOSIS Your health care provider will do a physical exam and take your medical history. During the exam, the health care provider may move one or both of your legs to different positions to check for pain. Various tests may be done to help verify the diagnosis, including:  Imaging tests to look for other causes of pain. These may include:  MRI.  CT scan.  Bone scan.  Diagnostic injection. A numbing medicine is injected into the SI joint using a needle. If the pain is temporarily improved or stopped after the injection, this can indicate that SI joint dysfunction is the problem. TREATMENT Treatment may vary depending on the cause and severity of your condition. Treatment options may  include:  Applying ice or heat to the lower back area. This can help to reduce pain and muscle spasms.  Medicines to relieve pain or inflammation or to relax the muscles.  Wearing a back brace (sacroiliac brace) to help support the joint while your back is healing.  Physical therapy to increase muscle strength around the joint and flexibility at the joint. This may also involve learning proper body positions and ways of moving to relieve stress on the joint.  Direct manipulation of the SI joint.  Injections of steroid medicine into the joint in order to reduce pain and swelling.  Radiofrequency ablation to burn away nerves that are carrying pain messages from the joint.  Use of a device that provides electrical stimulation in order to reduce pain at the joint.  Surgery to put in screws and plates that limit or prevent joint motion. This is rare. HOME CARE INSTRUCTIONS  Rest as needed. Limit your activities as directed by your health care provider.  Take medicines only as directed by your health care provider.  If directed, apply ice to the affected area:  Put ice in a plastic bag.  Place a towel between your skin and the bag.  Leave the ice on for 20 minutes, 2-3 times per day.  Use a heating pad or a moist heat pack as directed by your health care provider.  Exercise as directed by your health care provider or physical therapist.  Keep all follow-up visits as directed by your health care provider. This is important. SEEK MEDICAL CARE IF:  Your pain is not controlled   with medicine.  You have a fever.  You have increasingly severe pain. SEEK IMMEDIATE MEDICAL CARE IF:  You have weakness, numbness, or tingling in your legs or feet.  You lose control of your bladder or bowel.   This information is not intended to replace advice given to you by your health care provider. Make sure you discuss any questions you have with your health care provider.   Document Released:  10/08/2008 Document Revised: 11/26/2014 Document Reviewed: 03/19/2014 Elsevier Interactive Patient Education 2016 [employer] Inc[employer]  

## 2016-01-26 NOTE — ED Notes (Signed)
During exercise yesterday, pt felt a "pop" and immediate low back pain that radiates down right leg. Pain has persisted since onset. Pt has hx of degenerative disc disease and uses a Tens unit which she states usually controls her pain but not this time.

## 2016-02-11 DIAGNOSIS — I872 Venous insufficiency (chronic) (peripheral): Secondary | ICD-10-CM | POA: Diagnosis not present

## 2016-02-11 DIAGNOSIS — M79609 Pain in unspecified limb: Secondary | ICD-10-CM | POA: Diagnosis not present

## 2016-02-11 DIAGNOSIS — I8311 Varicose veins of right lower extremity with inflammation: Secondary | ICD-10-CM | POA: Diagnosis not present

## 2016-02-11 DIAGNOSIS — M7989 Other specified soft tissue disorders: Secondary | ICD-10-CM | POA: Diagnosis not present

## 2016-02-12 DIAGNOSIS — M7989 Other specified soft tissue disorders: Secondary | ICD-10-CM | POA: Diagnosis not present

## 2016-02-12 DIAGNOSIS — M79609 Pain in unspecified limb: Secondary | ICD-10-CM | POA: Diagnosis not present

## 2016-02-12 DIAGNOSIS — I872 Venous insufficiency (chronic) (peripheral): Secondary | ICD-10-CM | POA: Diagnosis not present

## 2016-02-19 ENCOUNTER — Ambulatory Visit (INDEPENDENT_AMBULATORY_CARE_PROVIDER_SITE_OTHER): Payer: BLUE CROSS/BLUE SHIELD | Admitting: Family Medicine

## 2016-02-19 ENCOUNTER — Encounter: Payer: Self-pay | Admitting: Family Medicine

## 2016-02-19 DIAGNOSIS — M545 Low back pain, unspecified: Secondary | ICD-10-CM

## 2016-02-19 DIAGNOSIS — F419 Anxiety disorder, unspecified: Secondary | ICD-10-CM | POA: Diagnosis not present

## 2016-02-19 DIAGNOSIS — F909 Attention-deficit hyperactivity disorder, unspecified type: Secondary | ICD-10-CM

## 2016-02-19 NOTE — Assessment & Plan Note (Signed)
The current medical regimen is effective;  continue present plan and medications.  

## 2016-02-19 NOTE — Assessment & Plan Note (Signed)
The current medical regimen is effective;  continue present plan and medications. a 

## 2016-02-19 NOTE — Progress Notes (Signed)
BP 107/67 (BP Location: Left Arm, Patient Position: Sitting, Cuff Size: Small)   Pulse 65   Temp 98 F (36.7 C)   Wt 137 lb (62.1 kg)   LMP 02/17/2016 (Exact Date) Comment: denies rpeg, signed preg waiver  SpO2 96%   BMI 22.11 kg/m    Subjective:    Patient ID: Jacqueline Howell, female    DOB: 30-Jul-1975, 40 y.o.   MRN: [id number]  HPI: [name] is a 40 y.o. female  Chief Complaint  Patient presents with  . medication check  Patient doing well with no complaints from medication sleeping well with good control of ADHD Anxiety issues stable not exacerbated by medication uses Klonopin intermittently   Relevant past medical, surgical, family and social history reviewed and updated as indicated. Interim medical history since our last visit reviewed. Allergies and medications reviewed and updated.  Review of Systems  Constitutional: Negative.   Respiratory: Negative.   Cardiovascular: Negative.     Per HPI unless specifically indicated above     Objective:    BP 107/67 (BP Location: Left Arm, Patient Position: Sitting, Cuff Size: Small)   Pulse 65   Temp 98 F (36.7 C)   Wt 137 lb (62.1 kg)   LMP 02/17/2016 (Exact Date) Comment: denies rpeg, signed preg waiver  SpO2 96%   BMI 22.11 kg/m   Wt Readings from Last 3 Encounters:  02/19/16 137 lb (62.1 kg)  01/26/16 135 lb (61.2 kg)  11/27/15 138 lb 12.8 oz (63 kg)    Physical Exam  Constitutional: She is oriented to person, place, and time. She appears well-developed and well-nourished. No distress.  HENT:  Head: Normocephalic and atraumatic.  Right Ear: Hearing normal.  Left Ear: Hearing normal.  Nose: Nose normal.  Eyes: Conjunctivae and lids are normal. Right eye exhibits no discharge. Left eye exhibits no discharge. No scleral icterus.  Cardiovascular: Normal rate, regular rhythm and normal heart sounds.   Pulmonary/Chest: Effort normal and breath sounds normal. No respiratory distress.  Musculoskeletal:  Normal range of motion.  Neurological: She is alert and oriented to person, place, and time.  Skin: Skin is intact. No rash noted.  Psychiatric: She has a normal mood and affect. Her speech is normal and behavior is normal. Judgment and thought content normal. Cognition and memory are normal.    Results for orders placed or performed in visit on 11/21/13  CBC with Differential/Platelet  Result Value Ref Range   WBC 8.8 3.6 - 11.0 x10 3/mm 3   RBC 4.49 3.80 - 5.20 X10 6/mm 3   HGB 13.3 12.0 - 16.0 g/dL   HCT 37.8 58.8 - 50.2 %   MCV 88 80 - 100 fL   MCH 29.6 26.0 - 34.0 pg   MCHC 33.6 32.0 - 36.0 g/dL   RDW 77.4 12.8 - 78.6 %   Platelet 121 (L) 150 - 440 x10 3/mm 3   Segmented Neutrophils 70 %   Lymphocytes 24 %   Monocytes 6 %   Comment - H1-Com1 RBCs APPEAR NORMAL    Comment - H1-Com2 NORMAL PLT MORPHOLGY   Hematocrit  Result Value Ref Range   HCT 38.0 35.0 - 47.0 %      Assessment & Plan:   Problem List Items Addressed This Visit      Other   ADHD (attention deficit hyperactivity disorder) (Chronic)    The current medical regimen is effective;  continue present plan and medications.  Chronic anxiety (Chronic)    The current medical regimen is effective;  continue present plan and medications.       Low back pain    The current medical regimen is effective;  continue present plan and medications. a       Other Visit Diagnoses   None.      Follow up plan: Return in about 6 months (around 08/21/2016), or if symptoms worsen or fail to improve, for Recheck medication ADHD and anxiety.Marland Kitchen

## 2016-02-24 ENCOUNTER — Other Ambulatory Visit: Payer: Self-pay | Admitting: Family Medicine

## 2016-02-24 MED ORDER — AMPHETAMINE-DEXTROAMPHETAMINE 10 MG PO TABS: 10.0000 mg | ORAL_TABLET | Freq: Every day | ORAL | 0 refills | Status: DC

## 2016-02-24 MED ORDER — AMPHETAMINE-DEXTROAMPHET ER 20 MG PO CP24: 20.0000 mg | ORAL_CAPSULE | ORAL | 0 refills | Status: DC

## 2016-02-25 DIAGNOSIS — M5136 Other intervertebral disc degeneration, lumbar region: Secondary | ICD-10-CM | POA: Diagnosis not present

## 2016-03-17 ENCOUNTER — Other Ambulatory Visit: Payer: Self-pay | Admitting: Family Medicine

## 2016-03-17 MED ORDER — AMPHETAMINE-DEXTROAMPHET ER 20 MG PO CP24: 20.0000 mg | ORAL_CAPSULE | ORAL | 0 refills | Status: DC

## 2016-03-17 MED ORDER — AMPHETAMINE-DEXTROAMPHETAMINE 10 MG PO TABS: 10.0000 mg | ORAL_TABLET | Freq: Every day | ORAL | 0 refills | Status: DC

## 2016-03-17 MED ORDER — CLONAZEPAM 1 MG PO TABS: 1.0000 mg | ORAL_TABLET | Freq: Every day | ORAL | 0 refills | Status: DC

## 2016-03-18 ENCOUNTER — Other Ambulatory Visit: Payer: Self-pay | Admitting: Family Medicine

## 2016-03-18 MED ORDER — CLONAZEPAM 1 MG PO TABS: 1.0000 mg | ORAL_TABLET | Freq: Every day | ORAL | 0 refills | Status: DC

## 2016-03-27 DIAGNOSIS — M5136 Other intervertebral disc degeneration, lumbar region: Secondary | ICD-10-CM | POA: Diagnosis not present

## 2016-04-12 ENCOUNTER — Other Ambulatory Visit: Payer: Self-pay | Admitting: Family Medicine

## 2016-04-12 MED ORDER — AMPHETAMINE-DEXTROAMPHETAMINE 10 MG PO TABS: 10.0000 mg | ORAL_TABLET | Freq: Every day | ORAL | 0 refills | Status: DC

## 2016-04-12 MED ORDER — AMPHETAMINE-DEXTROAMPHET ER 20 MG PO CP24: 20.0000 mg | ORAL_CAPSULE | ORAL | 0 refills | Status: DC

## 2016-04-12 MED ORDER — CLONAZEPAM 1 MG PO TABS: 1.0000 mg | ORAL_TABLET | Freq: Every day | ORAL | 0 refills | Status: DC

## 2016-04-26 DIAGNOSIS — M5136 Other intervertebral disc degeneration, lumbar region: Secondary | ICD-10-CM | POA: Diagnosis not present

## 2016-05-17 ENCOUNTER — Other Ambulatory Visit: Payer: Self-pay | Admitting: Family Medicine

## 2016-05-17 MED ORDER — CLONAZEPAM 1 MG PO TABS: 1.0000 mg | ORAL_TABLET | Freq: Every day | ORAL | 0 refills | Status: DC

## 2016-05-17 MED ORDER — AMPHETAMINE-DEXTROAMPHETAMINE 10 MG PO TABS: 10.0000 mg | ORAL_TABLET | Freq: Every day | ORAL | 0 refills | Status: DC

## 2016-05-17 MED ORDER — AMPHETAMINE-DEXTROAMPHET ER 20 MG PO CP24: 20.0000 mg | ORAL_CAPSULE | ORAL | 0 refills | Status: DC

## 2016-05-26 DIAGNOSIS — E559 Vitamin D deficiency, unspecified: Secondary | ICD-10-CM

## 2016-05-26 HISTORY — DX: Vitamin D deficiency, unspecified: E55.9

## 2016-05-27 DIAGNOSIS — M5136 Other intervertebral disc degeneration, lumbar region: Secondary | ICD-10-CM | POA: Diagnosis not present

## 2016-06-03 DIAGNOSIS — Z124 Encounter for screening for malignant neoplasm of cervix: Secondary | ICD-10-CM | POA: Diagnosis not present

## 2016-06-03 DIAGNOSIS — Z01419 Encounter for gynecological examination (general) (routine) without abnormal findings: Secondary | ICD-10-CM | POA: Diagnosis not present

## 2016-06-03 DIAGNOSIS — Z1151 Encounter for screening for human papillomavirus (HPV): Secondary | ICD-10-CM | POA: Diagnosis not present

## 2016-06-03 DIAGNOSIS — Z1239 Encounter for other screening for malignant neoplasm of breast: Secondary | ICD-10-CM | POA: Diagnosis not present

## 2016-06-16 DIAGNOSIS — Z23 Encounter for immunization: Secondary | ICD-10-CM | POA: Diagnosis not present

## 2016-06-16 DIAGNOSIS — Z1239 Encounter for other screening for malignant neoplasm of breast: Secondary | ICD-10-CM | POA: Diagnosis not present

## 2016-06-16 DIAGNOSIS — Z01419 Encounter for gynecological examination (general) (routine) without abnormal findings: Secondary | ICD-10-CM | POA: Diagnosis not present

## 2016-06-16 DIAGNOSIS — Z1151 Encounter for screening for human papillomavirus (HPV): Secondary | ICD-10-CM | POA: Diagnosis not present

## 2016-06-16 DIAGNOSIS — Z124 Encounter for screening for malignant neoplasm of cervix: Secondary | ICD-10-CM | POA: Diagnosis not present

## 2016-06-21 ENCOUNTER — Other Ambulatory Visit: Payer: Self-pay | Admitting: Family Medicine

## 2016-06-21 MED ORDER — CLONAZEPAM 1 MG PO TABS: 1.0000 mg | ORAL_TABLET | Freq: Every day | ORAL | 0 refills | Status: DC

## 2016-06-21 MED ORDER — AMPHETAMINE-DEXTROAMPHETAMINE 10 MG PO TABS: 10.0000 mg | ORAL_TABLET | Freq: Every day | ORAL | 0 refills | Status: DC

## 2016-06-21 MED ORDER — AMPHETAMINE-DEXTROAMPHET ER 20 MG PO CP24: 20.0000 mg | ORAL_CAPSULE | ORAL | 0 refills | Status: DC

## 2016-06-26 DIAGNOSIS — M5136 Other intervertebral disc degeneration, lumbar region: Secondary | ICD-10-CM | POA: Diagnosis not present

## 2016-07-13 ENCOUNTER — Other Ambulatory Visit: Payer: Self-pay | Admitting: Family Medicine

## 2016-07-13 MED ORDER — AMPHETAMINE-DEXTROAMPHET ER 20 MG PO CP24: 20.0000 mg | ORAL_CAPSULE | ORAL | 0 refills | Status: DC

## 2016-07-13 MED ORDER — CLONAZEPAM 1 MG PO TABS: 1.0000 mg | ORAL_TABLET | Freq: Every day | ORAL | 0 refills | Status: DC

## 2016-07-13 MED ORDER — AMPHETAMINE-DEXTROAMPHETAMINE 10 MG PO TABS: 10.0000 mg | ORAL_TABLET | Freq: Every day | ORAL | 0 refills | Status: DC

## 2016-07-27 DIAGNOSIS — M5136 Other intervertebral disc degeneration, lumbar region: Secondary | ICD-10-CM | POA: Diagnosis not present

## 2016-08-09 ENCOUNTER — Telehealth: Payer: Self-pay | Admitting: Family Medicine

## 2016-08-09 ENCOUNTER — Ambulatory Visit (INDEPENDENT_AMBULATORY_CARE_PROVIDER_SITE_OTHER): Payer: BLUE CROSS/BLUE SHIELD | Admitting: Family Medicine

## 2016-08-09 ENCOUNTER — Encounter: Payer: Self-pay | Admitting: Family Medicine

## 2016-08-09 VITALS — BP 104/67 | HR 55 | Temp 98.4°F | Ht 66.0 in | Wt 138.0 lb

## 2016-08-09 DIAGNOSIS — F419 Anxiety disorder, unspecified: Secondary | ICD-10-CM

## 2016-08-09 DIAGNOSIS — F908 Attention-deficit hyperactivity disorder, other type: Secondary | ICD-10-CM

## 2016-08-09 MED ORDER — FLUCONAZOLE 150 MG PO TABS: 150.0000 mg | ORAL_TABLET | Freq: Once | ORAL | 2 refills | Status: AC

## 2016-08-09 MED ORDER — AMPHETAMINE-DEXTROAMPHET ER 10 MG PO CP24: 10.0000 mg | ORAL_CAPSULE | ORAL | 0 refills | Status: DC

## 2016-08-09 NOTE — Telephone Encounter (Signed)
Called and spoke to pharmacist. Pt just picked up medication on 08/02/16, rx was written 07/13/16 by Dr. Dossie Arbourrissman. Pharmacy will hold RX until refill is needed.

## 2016-08-09 NOTE — Progress Notes (Signed)
BP 104/67   Pulse (!) 55   Temp 98.4 F (36.9 C) (Oral)   Ht 5\' 6"  (1.676 m)   Wt 138 lb (62.6 kg)   SpO2 98%   BMI 22.27 kg/m    Subjective:    Patient ID: Jacqueline Howell, female    DOB: 09/18/1975, 41 y.o.   MRN: [id number]  HPI: Jacqueline Howell is a 41 y.o. female  Chief Complaint  Patient presents with  . Medication Management    ADHD, Anxiety   Patient doing well with medications feels that Adderall XR 20 may be too strong with some anxiety tried to 10 mg and does better wants to change the XRT 22 XRT 10. Clonazepam use helps a great deal especially on snow days refill plan of one refill every 28 days is doing well will continue that plan. Relevant past medical, surgical, family and social history reviewed and updated as indicated. Interim medical history since our last visit reviewed. Allergies and medications reviewed and updated.  Review of Systems  Constitutional: Negative.   Respiratory: Negative.   Cardiovascular: Negative.     Per HPI unless specifically indicated above     Objective:    BP 104/67   Pulse (!) 55   Temp 98.4 F (36.9 C) (Oral)   Ht 5\' 6"  (1.676 m)   Wt 138 lb (62.6 kg)   SpO2 98%   BMI 22.27 kg/m   Wt Readings from Last 3 Encounters:  08/09/16 138 lb (62.6 kg)  02/19/16 137 lb (62.1 kg)  01/26/16 135 lb (61.2 kg)    Physical Exam  Constitutional: She is oriented to person, place, and time. She appears well-developed and well-nourished. No distress.  HENT:  Head: Normocephalic and atraumatic.  Right Ear: Hearing normal.  Left Ear: Hearing normal.  Nose: Nose normal.  Eyes: Conjunctivae and lids are normal. Right eye exhibits no discharge. Left eye exhibits no discharge. No scleral icterus.  Cardiovascular: Normal rate, regular rhythm and normal heart sounds.   Pulmonary/Chest: Effort normal and breath sounds normal. No respiratory distress.  Musculoskeletal: Normal range of motion.  Neurological: She is alert and oriented to  person, place, and time.  Skin: Skin is intact. No rash noted.  Psychiatric: She has a normal mood and affect. Her speech is normal and behavior is normal. Judgment and thought content normal. Cognition and memory are normal.    Results for orders placed or performed in visit on 11/21/13  CBC with Differential/Platelet  Result Value Ref Range   WBC 8.8 3.6 - 11.0 x10 3/mm 3   RBC 4.49 3.80 - 5.20 X10 6/mm 3   HGB 13.3 12.0 - 16.0 g/dL   HCT 14.739.5 82.935.0 - 56.247.0 %   MCV 88 80 - 100 fL   MCH 29.6 26.0 - 34.0 pg   MCHC 33.6 32.0 - 36.0 g/dL   RDW 13.013.5 86.511.5 - 78.414.5 %   Platelet 121 (L) 150 - 440 x10 3/mm 3   Segmented Neutrophils 70 %   Lymphocytes 24 %   Monocytes 6 %   Comment - H1-Com1 RBCs APPEAR NORMAL    Comment - H1-Com2 NORMAL PLT MORPHOLGY   Hematocrit  Result Value Ref Range   HCT 38.0 35.0 - 47.0 %      Assessment & Plan:   Problem List Items Addressed This Visit      Other   ADHD (attention deficit hyperactivity disorder) - Primary (Chronic)    Stable will cut back  XR 20 mg XR 10 mg and follow-up this summer.      Relevant Medications   amphetamine-dextroamphetamine (ADDERALL XR) 10 MG 24 hr capsule   Chronic anxiety (Chronic)    Stable on current dose continue current medications give refills once every month until next visit this summer.        Diflucan also given for vaginitis  Follow up plan: Return in about 6 months (around 02/06/2017) for Medicine check.

## 2016-08-09 NOTE — Assessment & Plan Note (Signed)
Stable will cut back XR 20 mg XR 10 mg and follow-up this summer.

## 2016-08-09 NOTE — Telephone Encounter (Signed)
[facility] needs clarification on the prescription for patients adderall that was written today.  Thank Bonita QuinYou,  Sheppard PentonKaren  [facility] [phone]

## 2016-08-09 NOTE — Assessment & Plan Note (Signed)
Stable on current dose continue current medications give refills once every month until next visit this summer.

## 2016-08-13 ENCOUNTER — Other Ambulatory Visit: Payer: Self-pay | Admitting: Family Medicine

## 2016-08-13 MED ORDER — CLONAZEPAM 1 MG PO TABS: 1.0000 mg | ORAL_TABLET | Freq: Every day | ORAL | 0 refills | Status: DC

## 2016-08-13 MED ORDER — AMPHETAMINE-DEXTROAMPHETAMINE 10 MG PO TABS: 10.0000 mg | ORAL_TABLET | Freq: Every day | ORAL | 0 refills | Status: DC

## 2016-08-26 ENCOUNTER — Other Ambulatory Visit: Payer: Self-pay | Admitting: Family Medicine

## 2016-08-26 DIAGNOSIS — F908 Attention-deficit hyperactivity disorder, other type: Secondary | ICD-10-CM

## 2016-08-26 MED ORDER — AMPHETAMINE-DEXTROAMPHETAMINE 10 MG PO TABS: 10.0000 mg | ORAL_TABLET | Freq: Every day | ORAL | 0 refills | Status: DC

## 2016-08-26 MED ORDER — CLONAZEPAM 1 MG PO TABS: 1.0000 mg | ORAL_TABLET | Freq: Every day | ORAL | 0 refills | Status: DC

## 2016-08-26 MED ORDER — AMPHETAMINE-DEXTROAMPHET ER 10 MG PO CP24: 10.0000 mg | ORAL_CAPSULE | ORAL | 0 refills | Status: DC

## 2016-08-27 DIAGNOSIS — M5136 Other intervertebral disc degeneration, lumbar region: Secondary | ICD-10-CM | POA: Diagnosis not present

## 2016-08-30 ENCOUNTER — Telehealth: Payer: Self-pay | Admitting: Family Medicine

## 2016-08-30 NOTE — Telephone Encounter (Signed)
Call pt 

## 2016-08-30 NOTE — Telephone Encounter (Signed)
Please advise 
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
## 2016-08-31 MED ORDER — TRAZODONE HCL 50 MG PO TABS: 25.0000 mg | ORAL_TABLET | Freq: Every evening | ORAL | 3 refills | Status: DC | PRN

## 2016-08-31 NOTE — Telephone Encounter (Signed)
Patient under a great deal of stress. Is seeing her GYN tomorrow who will do STD testing patient with no symptoms need something to help with sleep. Will call and trazodone reviewed no change in clonazepam usage.

## 2016-09-01 DIAGNOSIS — Z113 Encounter for screening for infections with a predominantly sexual mode of transmission: Secondary | ICD-10-CM | POA: Diagnosis not present

## 2016-09-01 DIAGNOSIS — G4700 Insomnia, unspecified: Secondary | ICD-10-CM | POA: Diagnosis not present

## 2016-09-01 DIAGNOSIS — F419 Anxiety disorder, unspecified: Secondary | ICD-10-CM | POA: Diagnosis not present

## 2016-09-20 ENCOUNTER — Other Ambulatory Visit: Payer: Self-pay | Admitting: Family Medicine

## 2016-09-20 DIAGNOSIS — F908 Attention-deficit hyperactivity disorder, other type: Secondary | ICD-10-CM

## 2016-09-20 MED ORDER — AMPHETAMINE-DEXTROAMPHET ER 10 MG PO CP24: 10.0000 mg | ORAL_CAPSULE | ORAL | 0 refills | Status: DC

## 2016-09-20 MED ORDER — CLONAZEPAM 1 MG PO TABS: 1.0000 mg | ORAL_TABLET | Freq: Every day | ORAL | 0 refills | Status: DC

## 2016-09-20 MED ORDER — AMPHETAMINE-DEXTROAMPHETAMINE 10 MG PO TABS: 10.0000 mg | ORAL_TABLET | Freq: Every day | ORAL | 0 refills | Status: DC

## 2016-09-24 DIAGNOSIS — M5136 Other intervertebral disc degeneration, lumbar region: Secondary | ICD-10-CM | POA: Diagnosis not present

## 2016-10-25 DIAGNOSIS — M5136 Other intervertebral disc degeneration, lumbar region: Secondary | ICD-10-CM | POA: Diagnosis not present

## 2016-10-26 ENCOUNTER — Other Ambulatory Visit: Payer: Self-pay | Admitting: Family Medicine

## 2016-10-26 DIAGNOSIS — F908 Attention-deficit hyperactivity disorder, other type: Secondary | ICD-10-CM

## 2016-10-26 MED ORDER — AMPHETAMINE-DEXTROAMPHET ER 10 MG PO CP24
10.0000 mg | ORAL_CAPSULE | ORAL | 0 refills | Status: DC
Start: 2016-10-26 — End: 2016-11-22

## 2016-10-26 MED ORDER — CLONAZEPAM 1 MG PO TABS: 1.0000 mg | ORAL_TABLET | Freq: Every day | ORAL | 0 refills | Status: DC

## 2016-10-26 MED ORDER — AMPHETAMINE-DEXTROAMPHETAMINE 10 MG PO TABS: 10.0000 mg | ORAL_TABLET | Freq: Every day | ORAL | 0 refills | Status: DC

## 2016-11-08 DIAGNOSIS — F4322 Adjustment disorder with anxiety: Secondary | ICD-10-CM | POA: Diagnosis not present

## 2016-11-17 DIAGNOSIS — F4322 Adjustment disorder with anxiety: Secondary | ICD-10-CM | POA: Diagnosis not present

## 2016-11-22 ENCOUNTER — Other Ambulatory Visit: Payer: Self-pay | Admitting: Family Medicine

## 2016-11-22 DIAGNOSIS — F4322 Adjustment disorder with anxiety: Secondary | ICD-10-CM | POA: Diagnosis not present

## 2016-11-22 DIAGNOSIS — F908 Attention-deficit hyperactivity disorder, other type: Secondary | ICD-10-CM

## 2016-11-22 MED ORDER — AMPHETAMINE-DEXTROAMPHETAMINE 10 MG PO TABS: 10.0000 mg | ORAL_TABLET | Freq: Every day | ORAL | 0 refills | Status: DC

## 2016-11-22 MED ORDER — AMPHETAMINE-DEXTROAMPHET ER 10 MG PO CP24: 10.0000 mg | ORAL_CAPSULE | ORAL | 0 refills | Status: DC

## 2016-11-22 MED ORDER — CLONAZEPAM 1 MG PO TABS: 1.0000 mg | ORAL_TABLET | Freq: Every day | ORAL | 0 refills | Status: DC

## 2016-11-24 DIAGNOSIS — M5136 Other intervertebral disc degeneration, lumbar region: Secondary | ICD-10-CM | POA: Diagnosis not present

## 2016-11-30 ENCOUNTER — Other Ambulatory Visit: Payer: Self-pay | Admitting: Family Medicine

## 2016-11-30 DIAGNOSIS — F908 Attention-deficit hyperactivity disorder, other type: Secondary | ICD-10-CM

## 2016-11-30 MED ORDER — CLONAZEPAM 1 MG PO TABS: 1.0000 mg | ORAL_TABLET | Freq: Every day | ORAL | 0 refills | Status: DC

## 2016-11-30 MED ORDER — AMPHETAMINE-DEXTROAMPHETAMINE 10 MG PO TABS: 10.0000 mg | ORAL_TABLET | Freq: Every day | ORAL | 0 refills | Status: DC

## 2016-11-30 MED ORDER — AMPHETAMINE-DEXTROAMPHET ER 10 MG PO CP24: 10.0000 mg | ORAL_CAPSULE | ORAL | 0 refills | Status: DC

## 2016-12-01 DIAGNOSIS — F4322 Adjustment disorder with anxiety: Secondary | ICD-10-CM | POA: Diagnosis not present

## 2016-12-09 DIAGNOSIS — F4322 Adjustment disorder with anxiety: Secondary | ICD-10-CM | POA: Diagnosis not present

## 2016-12-17 ENCOUNTER — Other Ambulatory Visit: Payer: Self-pay | Admitting: Obstetrics and Gynecology

## 2016-12-17 ENCOUNTER — Other Ambulatory Visit: Payer: Self-pay | Admitting: Family Medicine

## 2016-12-17 DIAGNOSIS — Z1231 Encounter for screening mammogram for malignant neoplasm of breast: Secondary | ICD-10-CM

## 2016-12-17 DIAGNOSIS — F4322 Adjustment disorder with anxiety: Secondary | ICD-10-CM | POA: Diagnosis not present

## 2016-12-21 ENCOUNTER — Telehealth: Payer: Self-pay

## 2016-12-21 ENCOUNTER — Encounter: Payer: Self-pay | Admitting: Family Medicine

## 2016-12-21 ENCOUNTER — Ambulatory Visit (INDEPENDENT_AMBULATORY_CARE_PROVIDER_SITE_OTHER): Payer: BLUE CROSS/BLUE SHIELD | Admitting: Family Medicine

## 2016-12-21 DIAGNOSIS — F329 Major depressive disorder, single episode, unspecified: Secondary | ICD-10-CM | POA: Insufficient documentation

## 2016-12-21 DIAGNOSIS — F331 Major depressive disorder, recurrent, moderate: Secondary | ICD-10-CM | POA: Diagnosis not present

## 2016-12-21 DIAGNOSIS — F32A Depression, unspecified: Secondary | ICD-10-CM | POA: Insufficient documentation

## 2016-12-21 MED ORDER — SERTRALINE HCL 50 MG PO TABS: 50.0000 mg | ORAL_TABLET | Freq: Every day | ORAL | 3 refills | Status: DC

## 2016-12-21 NOTE — Assessment & Plan Note (Signed)
Discussed depression care and treatment will continue working with therapist start Zoloft 50 mg 1 a day recheck 2 weeks or so. Discussed slow onset of the effectiveness.

## 2016-12-21 NOTE — Telephone Encounter (Signed)
  Last routine OV: 08/09/16 Next OV: 01/19/17

## 2016-12-21 NOTE — Telephone Encounter (Signed)
Pt coming in to see Provider this afternoon. Will close telephone encounter.

## 2016-12-21 NOTE — Progress Notes (Signed)
BP 125/80   Pulse (!) 54   Wt 135 lb (61.2 kg)   SpO2 99%   BMI 21.79 kg/m    Subjective:    Patient ID: Jacqueline LoaderKaren E Howell, female    DOB: 01-24-1976, 41 y.o.   MRN: [id number]  HPI: Jacqueline Howell is a 41 y.o. female  Chief Complaint  Patient presents with  . Medication Managment    Previously on Prozac does not want to go back on that one  . Depression  Patient with a lot of stress going on in her life. Having problems with her husband and working with therapist on saving her marriage. Patient's nerves have gotten worse and therapist is recommending antidepressants. Patient has taken Prozac in the past and felt bloated Zoloft has been suggested. Depression scale patient in discussion states that she's feeling really good today and isn't a true representation of how she feels on a usual basis.  Relevant past medical, surgical, family and social history reviewed and updated as indicated. Interim medical history since our last visit reviewed. Allergies and medications reviewed and updated.  Review of Systems  Constitutional: Negative.   Respiratory: Negative.   Cardiovascular: Negative.     Per HPI unless specifically indicated above     Objective:    BP 125/80   Pulse (!) 54   Wt 135 lb (61.2 kg)   SpO2 99%   BMI 21.79 kg/m   Wt Readings from Last 3 Encounters:  12/21/16 135 lb (61.2 kg)  08/09/16 138 lb (62.6 kg)  02/19/16 137 lb (62.1 kg)    Physical Exam  Constitutional: She is oriented to person, place, and time. She appears well-developed and well-nourished.  HENT:  Head: Normocephalic and atraumatic.  Eyes: Conjunctivae and EOM are normal.  Neck: Normal range of motion.  Cardiovascular: Normal rate, regular rhythm and normal heart sounds.   Pulmonary/Chest: Effort normal and breath sounds normal.  Musculoskeletal: Normal range of motion.  Neurological: She is alert and oriented to person, place, and time.  Skin: No erythema.  Psychiatric: She has a normal  mood and affect. Her behavior is normal. Judgment and thought content normal.    Results for orders placed or performed in visit on 11/21/13  CBC with Differential/Platelet  Result Value Ref Range   WBC 8.8 3.6 - 11.0 x10 3/mm 3   RBC 4.49 3.80 - 5.20 X10 6/mm 3   HGB 13.3 12.0 - 16.0 g/dL   HCT 40.939.5 81.135.0 - 91.447.0 %   MCV 88 80 - 100 fL   MCH 29.6 26.0 - 34.0 pg   MCHC 33.6 32.0 - 36.0 g/dL   RDW 78.213.5 95.611.5 - 21.314.5 %   Platelet 121 (L) 150 - 440 x10 3/mm 3   Segmented Neutrophils 70 %   Lymphocytes 24 %   Monocytes 6 %   Comment - H1-Com1 RBCs APPEAR NORMAL    Comment - H1-Com2 NORMAL PLT MORPHOLGY   Hematocrit  Result Value Ref Range   HCT 38.0 35.0 - 47.0 %      Assessment & Plan:   Problem List Items Addressed This Visit      Other   Depression    Discussed depression care and treatment will continue working with therapist start Zoloft 50 mg 1 a day recheck 2 weeks or so. Discussed slow onset of the effectiveness.      Relevant Medications   sertraline (ZOLOFT) 50 MG tablet       Follow up plan:  Return in about 2 weeks (around 01/04/2017) for Depression med check.Marland Kitchen
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
## 2016-12-25 DIAGNOSIS — M5136 Other intervertebral disc degeneration, lumbar region: Secondary | ICD-10-CM | POA: Diagnosis not present

## 2016-12-29 ENCOUNTER — Ambulatory Visit (INDEPENDENT_AMBULATORY_CARE_PROVIDER_SITE_OTHER): Payer: BLUE CROSS/BLUE SHIELD | Admitting: Vascular Surgery

## 2016-12-29 ENCOUNTER — Ambulatory Visit
Admission: RE | Admit: 2016-12-29 | Discharge: 2016-12-29 | Disposition: A | Discharge status: Home / Self Care | Payer: BLUE CROSS/BLUE SHIELD | Source: Ambulatory Visit | Attending: Obstetrics and Gynecology | Admitting: Obstetrics and Gynecology

## 2016-12-29 ENCOUNTER — Encounter (INDEPENDENT_AMBULATORY_CARE_PROVIDER_SITE_OTHER): Payer: Self-pay | Admitting: Vascular Surgery

## 2016-12-29 VITALS — BP 114/72 | HR 62 | Resp 16 | Wt 133.0 lb

## 2016-12-29 DIAGNOSIS — I8391 Asymptomatic varicose veins of right lower extremity: Secondary | ICD-10-CM

## 2016-12-29 DIAGNOSIS — I872 Venous insufficiency (chronic) (peripheral): Secondary | ICD-10-CM

## 2016-12-29 DIAGNOSIS — Z1231 Encounter for screening mammogram for malignant neoplasm of breast: Secondary | ICD-10-CM | POA: Diagnosis not present

## 2016-12-29 NOTE — Progress Notes (Signed)
Subjective:    Patient ID: Jacqueline LoaderKaren E Jurgens, female    DOB: Jan 03, 1976, 41 y.o.   MRN: [id number] Chief Complaint  Patient presents with  . Follow-up   Patient last seen 02/12/2016 for evaluation of right lower extremity varicose veins. At that time, the patient was experiencing progressive pain and edema associated with varicose veins located in her right lower extremity. She underwent a right lower extremity venous duplex for evaluation of reflux which was notable for no evidence of DVT or SVT. Recannulized greater saphenous vein in the lower mid to distal thigh. Incompetent right anterior accessory veins. The patient follows up today due to insurance issues. Since her initial visit, the patient has been wearing medical grade 1 compression stockings, engaging and elevation of her lower extremity and remaining active with minimal improvement in the right lower extremity pain and edema requiring the use of over-the-counter anti-inflammatory medications with minimal relief. The patient denies any ulcer formation. The patient denies any nausea, vomiting or fever.   Review of Systems  Constitutional: Negative.   HENT: Negative.   Eyes: Negative.   Respiratory: Negative.   Cardiovascular: Positive for leg swelling.       Right lower extremity painful varicose veins.  Endocrine: Negative.   Genitourinary: Negative.   Musculoskeletal: Negative.   Skin: Negative.   Allergic/Immunologic: Negative.   Neurological: Negative.   Hematological: Negative.   Psychiatric/Behavioral: Negative.       Objective:   Physical Exam  Constitutional: She is oriented to person, place, and time. She appears well-developed and well-nourished. No distress.  HENT:  Head: Normocephalic and atraumatic.  Eyes: Conjunctivae are normal. Pupils are equal, round, and reactive to light.  Neck: Normal range of motion.  Cardiovascular: Normal rate, regular rhythm, normal heart sounds and intact distal pulses.   Pulses:     Radial pulses are 2+ on the right side, and 2+ on the left side.       Dorsalis pedis pulses are 2+ on the right side, and 2+ on the left side.       Posterior tibial pulses are 2+ on the right side, and 2+ on the left side.  Pulmonary/Chest: Effort normal.  Musculoskeletal: Normal range of motion. She exhibits edema (Moderate right lower extremity edema).  Neurological: She is alert and oriented to person, place, and time.  Skin: Skin is warm and dry. She is not diaphoretic.     There is a large cluster of >1cm varicose veins located on the medial aspect of the patient's right knee   Psychiatric: She has a normal mood and affect. Her behavior is normal. Judgment and thought content normal.  Vitals reviewed.   BP 114/72   Pulse 62   Resp 16   Wt 133 lb (60.3 kg)   LMP 12/26/2016   BMI 21.47 kg/m   Past Medical History:  Diagnosis Date  . Depression     Social History   Social History  . Marital status: Married    Spouse name: N/A  . Number of children: N/A  . Years of education: N/A   Occupational History  . Not on file.   Social History Main Topics  . Smoking status: Never Smoker  . Smokeless tobacco: Never Used  . Alcohol use No  . Drug use: No  . Sexual activity: Not on file   Other Topics Concern  . Not on file   Social History Narrative  . No narrative on file    No past  surgical history on file.  Family History  Problem Relation Age of Onset  . Thyroid disease Mother   . Meniere's disease Father   . Breast cancer Neg Hx     No Known Allergies     Assessment & Plan:  Patient last seen 02/12/2016 for evaluation of right lower extremity varicose veins. At that time, the patient was experiencing progressive pain and edema associated with varicose veins located in her right lower extremity. She underwent a right lower extremity venous duplex for evaluation of reflux which was notable for no evidence of DVT or SVT. Recannulized greater saphenous  vein in the lower mid to distal thigh. Incompetent right anterior accessory veins. The patient follows up today due to insurance issues. Since her initial visit, the patient has been wearing medical grade 1 compression stockings, engaging and elevation of her lower extremity and remaining active with minimal improvement in the right lower extremity pain and edema requiring the use of over-the-counter anti-inflammatory medications with minimal relief. The patient denies any ulcer formation. The patient denies any nausea, vomiting or fever.  1. Chronic venous insufficiency - worsening Patient with recannulation of her right GSV. Patient has failed conservative therapy. The patient is likely to benefit from endovenous laser ablation. I have discussed the risks and benefits of the procedure. The risks primarily include DVT, recanalization, bleeding, infection, and inability to gain access. The patient would like to move forward. We'll apply to insurance. Continue conservative therapy until he received insurance approval.  2. Varicose veins of right lower extremity - worsening As above  Current Outpatient Prescriptions on File Prior to Visit  Medication Sig Dispense Refill  . amphetamine-dextroamphetamine (ADDERALL XR) 10 MG 24 hr capsule Take 1 capsule (10 mg total) by mouth every morning. 28 capsule 0  . amphetamine-dextroamphetamine (ADDERALL) 10 MG tablet Take 1 tablet (10 mg total) by mouth daily. 28 tablet 0  . clonazePAM (KLONOPIN) 1 MG tablet Take 1 tablet (1 mg total) by mouth daily. 28 tablet 0  . sertraline (ZOLOFT) 50 MG tablet Take 1 tablet (50 mg total) by mouth daily. 30 tablet 3  . traZODone (DESYREL) 50 MG tablet TAKE ONE-HALF TABLET TO 1 TABLET BY MOUTH AT BEDTIME AS NEEDED FOR SLEEP 30 tablet 5   No current facility-administered medications on file prior to visit.     There are no Patient Instructions on file for this visit. No Follow-up on file.   Cathlin Buchan A Namrata Dangler,  PA-C

## 2016-12-30 ENCOUNTER — Other Ambulatory Visit: Payer: Self-pay | Admitting: Family Medicine

## 2016-12-30 ENCOUNTER — Other Ambulatory Visit: Payer: Self-pay | Admitting: Obstetrics and Gynecology

## 2016-12-30 DIAGNOSIS — R928 Other abnormal and inconclusive findings on diagnostic imaging of breast: Secondary | ICD-10-CM | POA: Diagnosis not present

## 2016-12-30 DIAGNOSIS — F908 Attention-deficit hyperactivity disorder, other type: Secondary | ICD-10-CM

## 2016-12-30 DIAGNOSIS — N6489 Other specified disorders of breast: Secondary | ICD-10-CM

## 2016-12-30 MED ORDER — AMPHETAMINE-DEXTROAMPHET ER 10 MG PO CP24: 10.0000 mg | ORAL_CAPSULE | ORAL | 0 refills | Status: DC

## 2016-12-30 MED ORDER — AMPHETAMINE-DEXTROAMPHETAMINE 10 MG PO TABS: 10.0000 mg | ORAL_TABLET | Freq: Every day | ORAL | 0 refills | Status: DC

## 2016-12-30 MED ORDER — CLONAZEPAM 1 MG PO TABS: 1.0000 mg | ORAL_TABLET | Freq: Every day | ORAL | 0 refills | Status: DC

## 2017-01-04 DIAGNOSIS — F4322 Adjustment disorder with anxiety: Secondary | ICD-10-CM | POA: Diagnosis not present

## 2017-01-10 ENCOUNTER — Ambulatory Visit
Admission: RE | Admit: 2017-01-10 | Discharge: 2017-01-10 | Disposition: A | Discharge status: Home / Self Care | Payer: BLUE CROSS/BLUE SHIELD | Source: Ambulatory Visit | Attending: Obstetrics and Gynecology | Admitting: Obstetrics and Gynecology

## 2017-01-10 ENCOUNTER — Encounter: Payer: Self-pay | Admitting: Obstetrics and Gynecology

## 2017-01-10 DIAGNOSIS — R928 Other abnormal and inconclusive findings on diagnostic imaging of breast: Secondary | ICD-10-CM

## 2017-01-10 DIAGNOSIS — N6489 Other specified disorders of breast: Secondary | ICD-10-CM | POA: Diagnosis not present

## 2017-01-18 DIAGNOSIS — F4322 Adjustment disorder with anxiety: Secondary | ICD-10-CM | POA: Diagnosis not present

## 2017-01-19 ENCOUNTER — Ambulatory Visit: Payer: BLUE CROSS/BLUE SHIELD | Admitting: Family Medicine

## 2017-01-24 DIAGNOSIS — M5136 Other intervertebral disc degeneration, lumbar region: Secondary | ICD-10-CM | POA: Diagnosis not present

## 2017-02-02 ENCOUNTER — Ambulatory Visit: Payer: BLUE CROSS/BLUE SHIELD | Admitting: Family Medicine

## 2017-02-10 DIAGNOSIS — F4322 Adjustment disorder with anxiety: Secondary | ICD-10-CM | POA: Diagnosis not present

## 2017-02-14 ENCOUNTER — Encounter: Payer: Self-pay | Admitting: Family Medicine

## 2017-02-14 ENCOUNTER — Ambulatory Visit (INDEPENDENT_AMBULATORY_CARE_PROVIDER_SITE_OTHER): Payer: BLUE CROSS/BLUE SHIELD | Admitting: Family Medicine

## 2017-02-14 VITALS — BP 126/80 | HR 58 | Wt 130.0 lb

## 2017-02-14 DIAGNOSIS — F331 Major depressive disorder, recurrent, moderate: Secondary | ICD-10-CM | POA: Diagnosis not present

## 2017-02-14 DIAGNOSIS — F908 Attention-deficit hyperactivity disorder, other type: Secondary | ICD-10-CM

## 2017-02-14 DIAGNOSIS — F419 Anxiety disorder, unspecified: Secondary | ICD-10-CM | POA: Diagnosis not present

## 2017-02-14 MED ORDER — SERTRALINE HCL 100 MG PO TABS: 100.0000 mg | ORAL_TABLET | Freq: Every day | ORAL | 3 refills | Status: DC

## 2017-02-14 MED ORDER — AMPHETAMINE-DEXTROAMPHETAMINE 10 MG PO TABS: 10.0000 mg | ORAL_TABLET | Freq: Every day | ORAL | 0 refills | Status: DC

## 2017-02-14 MED ORDER — CLONAZEPAM 1 MG PO TABS: 1.0000 mg | ORAL_TABLET | Freq: Every day | ORAL | 0 refills | Status: DC

## 2017-02-14 MED ORDER — AMPHETAMINE-DEXTROAMPHET ER 10 MG PO CP24: 10.0000 mg | ORAL_CAPSULE | ORAL | 0 refills | Status: DC

## 2017-02-14 NOTE — Assessment & Plan Note (Signed)
The current medical regimen is effective;  continue present plan and medications.  

## 2017-02-14 NOTE — Progress Notes (Signed)
BP 126/80   Pulse (!) 58   Wt 130 lb (59 kg)   SpO2 96%   BMI 20.98 kg/m    Subjective:    Patient ID: Jacqueline Howell, female    DOB: [birth year], 41 y.o.   MRN: [id number]  HPI: Jacqueline Howell is a 41 y.o. female  Chief Complaint  Patient presents with  . Follow-up  . Depression  Patient all in all doing better still having a great deal of stress going on in her life. Working with therapist which is helping. ADHD stable. Depression reports 40-60% improved wants to increase 200 mg.  Relevant past medical, surgical, family and social history reviewed and updated as indicated. Interim medical history since our last visit reviewed. Allergies and medications reviewed and updated.  Review of Systems  Constitutional: Negative.   Respiratory: Negative.   Cardiovascular: Negative.     Per HPI unless specifically indicated above     Objective:    BP 126/80   Pulse (!) 58   Wt 130 lb (59 kg)   SpO2 96%   BMI 20.98 kg/m   Wt Readings from Last 3 Encounters:  02/14/17 130 lb (59 kg)  12/29/16 133 lb (60.3 kg)  12/21/16 135 lb (61.2 kg)    Physical Exam  Constitutional: She is oriented to person, place, and time. She appears well-developed and well-nourished.  HENT:  Head: Normocephalic and atraumatic.  Eyes: Conjunctivae and EOM are normal.  Neck: Normal range of motion.  Cardiovascular: Normal rate, regular rhythm and normal heart sounds.   Pulmonary/Chest: Effort normal and breath sounds normal.  Musculoskeletal: Normal range of motion.  Neurological: She is alert and oriented to person, place, and time.  Skin: No erythema.  Psychiatric: She has a normal mood and affect. Her behavior is normal. Judgment and thought content normal.    Results for orders placed or performed in visit on 11/21/13  CBC with Differential/Platelet  Result Value Ref Range   WBC 8.8 3.6 - 11.0 x10 3/mm 3   RBC 4.49 3.80 - 5.20 X10 6/mm 3   HGB 13.3 12.0 - 16.0 g/dL   HCT 08.6 57.8 -  46.9 %   MCV 88 80 - 100 fL   MCH 29.6 26.0 - 34.0 pg   MCHC 33.6 32.0 - 36.0 g/dL   RDW 62.9 52.8 - 41.3 %   Platelet 121 (L) 150 - 440 x10 3/mm 3   Segmented Neutrophils 70 %   Lymphocytes 24 %   Monocytes 6 %   Comment - H1-Com1 RBCs APPEAR NORMAL    Comment - H1-Com2 NORMAL PLT MORPHOLGY   Hematocrit  Result Value Ref Range   HCT 38.0 35.0 - 47.0 %      Assessment & Plan:   Problem List Items Addressed This Visit      Other   ADHD (attention deficit hyperactivity disorder) (Chronic)    The current medical regimen is effective;  continue present plan and medications.       Relevant Medications   amphetamine-dextroamphetamine (ADDERALL XR) 10 MG 24 hr capsule   amphetamine-dextroamphetamine (ADDERALL) 10 MG tablet   Chronic anxiety (Chronic)    The current medical regimen is effective;  continue present plan and medications.       Relevant Medications   sertraline (ZOLOFT) 100 MG tablet   Depression - Primary    Making steady improvement over the last 3 weeks sleeping okay no symptoms of activation. Will increase Zoloft 100 mg  Relevant Medications   sertraline (ZOLOFT) 100 MG tablet   clonazePAM (KLONOPIN) 1 MG tablet       Follow up plan: Return in about 2 months (around 04/17/2017) for Medicine check.

## 2017-02-14 NOTE — Assessment & Plan Note (Signed)
Making steady improvement over the last 3 weeks sleeping okay no symptoms of activation. Will increase Zoloft 100 mg

## 2017-02-21 ENCOUNTER — Other Ambulatory Visit: Payer: Self-pay | Admitting: Family Medicine

## 2017-02-21 DIAGNOSIS — F908 Attention-deficit hyperactivity disorder, other type: Secondary | ICD-10-CM

## 2017-02-21 DIAGNOSIS — F331 Major depressive disorder, recurrent, moderate: Secondary | ICD-10-CM

## 2017-02-21 MED ORDER — AMPHETAMINE-DEXTROAMPHET ER 10 MG PO CP24: 10.0000 mg | ORAL_CAPSULE | ORAL | 0 refills | Status: DC

## 2017-02-21 MED ORDER — AMPHETAMINE-DEXTROAMPHETAMINE 10 MG PO TABS: 10.0000 mg | ORAL_TABLET | Freq: Every day | ORAL | 0 refills | Status: DC

## 2017-02-21 MED ORDER — CLONAZEPAM 1 MG PO TABS: 1.0000 mg | ORAL_TABLET | Freq: Every day | ORAL | 0 refills | Status: DC

## 2017-02-24 DIAGNOSIS — M5136 Other intervertebral disc degeneration, lumbar region: Secondary | ICD-10-CM | POA: Diagnosis not present

## 2017-02-24 DIAGNOSIS — F4322 Adjustment disorder with anxiety: Secondary | ICD-10-CM | POA: Diagnosis not present

## 2017-03-09 DIAGNOSIS — F4322 Adjustment disorder with anxiety: Secondary | ICD-10-CM | POA: Diagnosis not present

## 2017-03-16 ENCOUNTER — Other Ambulatory Visit: Payer: Self-pay | Admitting: Family Medicine

## 2017-03-16 DIAGNOSIS — F331 Major depressive disorder, recurrent, moderate: Secondary | ICD-10-CM

## 2017-03-16 DIAGNOSIS — F908 Attention-deficit hyperactivity disorder, other type: Secondary | ICD-10-CM

## 2017-03-16 MED ORDER — AMPHETAMINE-DEXTROAMPHET ER 10 MG PO CP24: 10.0000 mg | ORAL_CAPSULE | ORAL | 0 refills | Status: DC

## 2017-03-16 MED ORDER — AMPHETAMINE-DEXTROAMPHETAMINE 10 MG PO TABS: 10.0000 mg | ORAL_TABLET | Freq: Every day | ORAL | 0 refills | Status: DC

## 2017-03-16 MED ORDER — CLONAZEPAM 1 MG PO TABS: 1.0000 mg | ORAL_TABLET | Freq: Every day | ORAL | 0 refills | Status: DC

## 2017-03-18 DIAGNOSIS — F4322 Adjustment disorder with anxiety: Secondary | ICD-10-CM | POA: Diagnosis not present

## 2017-03-25 DIAGNOSIS — F4322 Adjustment disorder with anxiety: Secondary | ICD-10-CM | POA: Diagnosis not present

## 2017-03-27 DIAGNOSIS — M5136 Other intervertebral disc degeneration, lumbar region: Secondary | ICD-10-CM | POA: Diagnosis not present

## 2017-04-01 ENCOUNTER — Encounter (INDEPENDENT_AMBULATORY_CARE_PROVIDER_SITE_OTHER): Payer: Self-pay | Admitting: Vascular Surgery

## 2017-04-01 ENCOUNTER — Ambulatory Visit (INDEPENDENT_AMBULATORY_CARE_PROVIDER_SITE_OTHER): Payer: BLUE CROSS/BLUE SHIELD | Admitting: Vascular Surgery

## 2017-04-01 VITALS — BP 117/73 | HR 60 | Resp 17 | Wt 126.0 lb

## 2017-04-01 DIAGNOSIS — I8391 Asymptomatic varicose veins of right lower extremity: Secondary | ICD-10-CM | POA: Diagnosis not present

## 2017-04-01 NOTE — Progress Notes (Signed)
Patient ID: Jacqueline Howell, female   DOB: Dec 25, 1975, 41 y.o.   MRN: [id number]  Chief Complaint  Patient presents with  . Follow-up    59mo laser documention    HPI Jacqueline Howell is a 41 y.o. female.  Patient returns in follow up of their venous disease.  They have done their best to comply with the prescribed conservative therapies of compression stockings, leg elevation, exercise, and still requires anti-inflammatories for discomfort and has symptoms that are persistent and bothersome on a daily basis, affecting their activities of daily living and normal activities.  The venous reflux study demonstrates recanalization of the right GSV.    Past Medical History:  Diagnosis Date  . Depression     No past surgical history on file.  Social History        Social History  . Marital status: Married    Spouse name: N/A  . Number of children: N/A  . Years of education: N/A      Occupational History  . Not on file.       Social History Main Topics  . Smoking status: Never Smoker  . Smokeless tobacco: Never Used  . Alcohol use No  . Drug use: No  . Sexual activity: Not on file       Other Topics Concern  . Not on file      Social History Narrative  . No narrative on file    No past surgical history on file.       Family History  Problem Relation Age of Onset  . Thyroid disease Mother   . Meniere's disease Father   . Breast cancer Neg Hx     No Known Allergies   Current Outpatient Prescriptions  Medication Sig Dispense Refill  . amphetamine-dextroamphetamine (ADDERALL XR) 10 MG 24 hr capsule Take 1 capsule (10 mg total) by mouth every morning. 28 capsule 0  . amphetamine-dextroamphetamine (ADDERALL) 10 MG tablet Take 1 tablet (10 mg total) by mouth daily. 28 tablet 0  . clonazePAM (KLONOPIN) 1 MG tablet Take 1 tablet (1 mg total) by mouth daily. 28 tablet 0  . sertraline (ZOLOFT) 100 MG tablet Take 1 tablet (100 mg total) by mouth daily. 30  tablet 3  . traZODone (DESYREL) 50 MG tablet TAKE ONE-HALF TABLET TO 1 TABLET BY MOUTH AT BEDTIME AS NEEDED FOR SLEEP 30 tablet 5   No current facility-administered medications for this visit.       REVIEW OF SYSTEMS (Negative unless checked)  Constitutional: [] Weight loss  [] Fever  [] Chills Cardiac: [] Chest pain   [] Chest pressure   [] Palpitations   [] Shortness of breath when laying flat   [] Shortness of breath at rest   [] Shortness of breath with exertion. Vascular:  [] Pain in legs with walking   [] Pain in legs at rest   [] Pain in legs when laying flat   [] Claudication   [] Pain in feet when walking  [] Pain in feet at rest  [] Pain in feet when laying flat   [] History of DVT   [] Phlebitis   [x] Swelling in legs   [x] Varicose veins   [] Non-healing ulcers Pulmonary:   [] Uses home oxygen   [] Productive cough   [] Hemoptysis   [] Wheeze  [] COPD   [] Asthma Neurologic:  [] Dizziness  [] Blackouts   [] Seizures   [] History of stroke   [] History of TIA  [] Aphasia   [] Temporary blindness   [] Dysphagia   [] Weakness or numbness in arms   [] Weakness or numbness in  legs Musculoskeletal:  Arthritis   Joint swelling   Joint pain   Low back pain Hematologic:  Easy bruising  Easy bleeding   Hypercoagulable state   Anemic  Hepatitis Gastrointestinal:  Blood in stool   Vomiting blood  Gastroesophageal reflux/heartburn   Abdominal pain Genitourinary:  Chronic kidney disease   Difficult urination  Frequent urination  Burning with urination   Hematuria Skin:  Rashes   Ulcers   Wounds Psychological:  History of anxiety    History of major depression.    Physical Exam BP 117/73   Pulse 60   Resp 17   Wt 57.2 kg (126 lb)   BMI 20.34 kg/m  Gen:  WD/WN, NAD Head: [place]/AT, No temporalis wasting.  Ear/Nose/Throat: Hearing grossly intact, dentition good Eyes: Sclera non-icteric. Conjunctiva clear Neck: Supple. Trachea midline Pulmonary:  Good air movement, no use of  accessory muscles, respirations not labored.  Cardiac: RRR, No JVD Vascular: Varicosities extensive and measuring up to 3-4 mm in the right lower extremity        Varicosities scattered and measuring up to 1 mm in the left lower extremity Vessel Right Left  Radial Palpable Palpable  Ulnar Palpable Palpable  Brachial Palpable Palpable  Carotid Palpable, without bruit Palpable, without bruit  Aorta Not palpable N/A  Femoral Palpable Palpable  Popliteal Palpable Palpable  PT Palpable Palpable  DP Palpable Palpable    Musculoskeletal: M/S 5/5 throughout.   Trace RLE edema.  No LLE edema Neurologic: Sensation grossly intact in extremities.  Symmetrical.  Speech is fluent.  Psychiatric: Judgment intact, Mood & affect appropriate for pt's clinical situation. Dermatologic: No rashes or ulcers noted.  No cellulitis or open wounds. Lymph : No Cervical, Axillary, or Inguinal lymphadenopathy.   Radiology No results found.  Labs No results found for this or any previous visit (from the past 2160 hour(s)).  Assessment/Plan:  Varicose veins of right lower extremity   The patient has done their best to comply with conservative therapy of 20-30 mm Hg compression stockings, leg elevation, exercise, and anti-inflammatories as needed for discomfort.  Despite this, they continue to have daily and persistent symptoms from their venous disease.  A venous reflux study demonstrates recanalization of the right GSV.  As such, the patient is likely to benefit from endovenous laser ablation of the right great saphenous vein.  Risks and benefits of the procedure including bleeding, infection, recanalization, DVT, and need for further therapy for residual varicosities were discussed.  The patient voices their understanding and is agreeable to proceed with right GSV laser ablation.     Jacqueline Howell 04/01/2017, 2:41 PM

## 2017-04-01 NOTE — Patient Instructions (Signed)
Varicose Vein Surgery, Care After Refer to this sheet in the next few weeks. These instructions provide you with information about caring for yourself after your procedure. Your health care provider may also give you more specific instructions. Your treatment has been planned according to current medical practices, but problems sometimes occur. Call your health care provider if you have any problems or questions after your procedure. What can I expect after the procedure? After your procedure, it is typical to have the following:  Swelling.  Bruising.  Soreness.  Mild skin discoloration.  Slight bleeding at incision sites.  Follow these instructions at home:  Take medicines only as directed by your health care provider.  Wear compression stockings as directed by your health care provider. These stockings help to prevent blood clots and reduce swelling in your legs.  There are many different ways to close and cover an incision, including stitches (sutures), skin glue, and adhesive strips. Follow your health care provider's instructions on: ? Incision care. ? Bandage (dressing) changes and removal. ? Incision closure removal.  Wear loose-fitting clothing.  Get regular daily exercise. Walk or ride a stationary bike daily or as directed by your health care provider.  Ask your health care provider when you can return to work. This may depend on the type of work you do.  Be patient with your recovery. It can take up to 4 weeks to get back to your usual activities. Contact a health care provider if:  You have a fever.  You have drainage, redness, swelling, or pain at an incision site.  You develop a cough. Get help right away if:  You pass out.  You have very bad pain in your leg.  You have leg pain that gets worse when you walk.  You have redness or swelling in your leg that is getting worse.  You have trouble breathing.  You cough up blood. This information is not  intended to replace advice given to you by your health care provider. Make sure you discuss any questions you have with your health care provider. Document Released: 03/15/2014 Document Revised: 12/18/2015 Document Reviewed: 12/19/2013 Elsevier Interactive Patient Education  2018 [employer]  

## 2017-04-06 DIAGNOSIS — F4322 Adjustment disorder with anxiety: Secondary | ICD-10-CM | POA: Diagnosis not present

## 2017-04-25 DIAGNOSIS — F4322 Adjustment disorder with anxiety: Secondary | ICD-10-CM | POA: Diagnosis not present

## 2017-04-26 DIAGNOSIS — M5136 Other intervertebral disc degeneration, lumbar region: Secondary | ICD-10-CM | POA: Diagnosis not present

## 2017-04-29 ENCOUNTER — Other Ambulatory Visit: Payer: Self-pay | Admitting: Family Medicine

## 2017-05-04 ENCOUNTER — Encounter: Payer: Self-pay | Admitting: Family Medicine

## 2017-05-04 ENCOUNTER — Ambulatory Visit (INDEPENDENT_AMBULATORY_CARE_PROVIDER_SITE_OTHER): Payer: BLUE CROSS/BLUE SHIELD | Admitting: Family Medicine

## 2017-05-04 VITALS — BP 126/81 | HR 50 | Wt 130.0 lb

## 2017-05-04 DIAGNOSIS — F908 Attention-deficit hyperactivity disorder, other type: Secondary | ICD-10-CM | POA: Diagnosis not present

## 2017-05-04 DIAGNOSIS — F331 Major depressive disorder, recurrent, moderate: Secondary | ICD-10-CM | POA: Diagnosis not present

## 2017-05-04 DIAGNOSIS — Z23 Encounter for immunization: Secondary | ICD-10-CM

## 2017-05-04 MED ORDER — TRAZODONE HCL 50 MG PO TABS: 50.0000 mg | ORAL_TABLET | Freq: Every day | ORAL | 11 refills | Status: DC

## 2017-05-04 MED ORDER — CLONAZEPAM 1 MG PO TABS: 1.0000 mg | ORAL_TABLET | Freq: Every day | ORAL | 0 refills | Status: DC

## 2017-05-04 MED ORDER — SERTRALINE HCL 50 MG PO TABS: 50.0000 mg | ORAL_TABLET | Freq: Every day | ORAL | 3 refills | Status: DC

## 2017-05-04 MED ORDER — AMPHETAMINE-DEXTROAMPHETAMINE 10 MG PO TABS: 10.0000 mg | ORAL_TABLET | Freq: Every day | ORAL | 0 refills | Status: DC

## 2017-05-04 MED ORDER — AMPHETAMINE-DEXTROAMPHET ER 10 MG PO CP24: 10.0000 mg | ORAL_CAPSULE | ORAL | 0 refills | Status: DC

## 2017-05-04 NOTE — Progress Notes (Signed)
BP 126/81   Pulse (!) 50   Wt 130 lb (59 kg)   SpO2 99%   BMI 20.98 kg/m    Subjective:    Patient ID: Jacqueline Howell, female    DOB: [birth year], 41 y.o.   MRN: [id number]  HPI: Jacqueline Howell is a 41 y.o. female  Chief Complaint  Patient presents with  . Follow-up  Patient follow-up very complicated social history with great deal of stress. Sertraline had been increased 100 mg to see if that would help some but actually made things worse and is back on 50 mg and depressions doing okay. Chronic anxiety with occasional storms takes clonazepam 1 mg when necessary for bad days prescriptions last more than 1 month. Takes Adderall XR every day and Adderall immediate release 10 mg most days as wakes up very early takes her Adderall XR and then follows with plain Adderall is able to sleep well except for high stress times when she takes trazodone which seems to help.  Relevant past medical, surgical, family and social history reviewed and updated as indicated. Interim medical history since our last visit reviewed. Allergies and medications reviewed and updated.  Review of Systems  Constitutional: Negative.   Respiratory: Negative.   Cardiovascular: Negative.     Per HPI unless specifically indicated above     Objective:    BP 126/81   Pulse (!) 50   Wt 130 lb (59 kg)   SpO2 99%   BMI 20.98 kg/m   Wt Readings from Last 3 Encounters:  05/04/17 130 lb (59 kg)  04/01/17 126 lb (57.2 kg)  02/14/17 130 lb (59 kg)    Physical Exam  Constitutional: She is oriented to person, place, and time. She appears well-developed and well-nourished.  HENT:  Head: Normocephalic and atraumatic.  Eyes: Conjunctivae and EOM are normal.  Neck: Normal range of motion.  Cardiovascular: Normal rate, regular rhythm and normal heart sounds.   Pulmonary/Chest: Effort normal and breath sounds normal.  Musculoskeletal: Normal range of motion.  Neurological: She is alert and oriented to person, place,  and time.  Skin: No erythema.  Psychiatric: She has a normal mood and affect. Her behavior is normal. Judgment and thought content normal.    Results for orders placed or performed in visit on 11/21/13  CBC with Differential/Platelet  Result Value Ref Range   WBC 8.8 3.6 - 11.0 x10 3/mm 3   RBC 4.49 3.80 - 5.20 X10 6/mm 3   HGB 13.3 12.0 - 16.0 g/dL   HCT 40.9 81.1 - 91.4 %   MCV 88 80 - 100 fL   MCH 29.6 26.0 - 34.0 pg   MCHC 33.6 32.0 - 36.0 g/dL   RDW 78.2 95.6 - 21.3 %   Platelet 121 (L) 150 - 440 x10 3/mm 3   Segmented Neutrophils 70 %   Lymphocytes 24 %   Monocytes 6 %   Comment - H1-Com1 RBCs APPEAR NORMAL    Comment - H1-Com2 NORMAL PLT MORPHOLGY   Hematocrit  Result Value Ref Range   HCT 38.0 35.0 - 47.0 %      Assessment & Plan:   Problem List Items Addressed This Visit      Other   ADHD (attention deficit hyperactivity disorder) (Chronic)   Relevant Medications   amphetamine-dextroamphetamine (ADDERALL XR) 10 MG 24 hr capsule   amphetamine-dextroamphetamine (ADDERALL) 10 MG tablet   Depression   Relevant Medications   traZODone (DESYREL) 50 MG tablet  sertraline (ZOLOFT) 50 MG tablet   clonazePAM (KLONOPIN) 1 MG tablet    Other Visit Diagnoses    Needs flu shot    -  Primary   Relevant Orders   Flu Vaccine QUAD 6+ mos PF IM (Fluarix Quad PF)       Follow up plan: Return in about 6 months (around 11/02/2017).

## 2017-05-10 DIAGNOSIS — F4322 Adjustment disorder with anxiety: Secondary | ICD-10-CM | POA: Diagnosis not present

## 2017-05-19 DIAGNOSIS — F4322 Adjustment disorder with anxiety: Secondary | ICD-10-CM | POA: Diagnosis not present

## 2017-05-22 ENCOUNTER — Telehealth: Payer: Self-pay | Admitting: Family Medicine

## 2017-05-23 NOTE — Telephone Encounter (Signed)
This prescription has been requested too early.

## 2017-05-23 NOTE — Telephone Encounter (Signed)
Spoke with patient, she already has a prescription.

## 2017-05-24 ENCOUNTER — Other Ambulatory Visit: Payer: Self-pay | Admitting: Family Medicine

## 2017-05-24 DIAGNOSIS — F331 Major depressive disorder, recurrent, moderate: Secondary | ICD-10-CM

## 2017-05-24 DIAGNOSIS — F908 Attention-deficit hyperactivity disorder, other type: Secondary | ICD-10-CM

## 2017-05-24 MED ORDER — AMPHETAMINE-DEXTROAMPHETAMINE 10 MG PO TABS: 10.0000 mg | ORAL_TABLET | Freq: Every day | ORAL | 0 refills | Status: DC

## 2017-05-24 MED ORDER — CLONAZEPAM 1 MG PO TABS: 1.0000 mg | ORAL_TABLET | Freq: Every day | ORAL | 0 refills | Status: DC

## 2017-05-24 MED ORDER — AMPHETAMINE-DEXTROAMPHET ER 10 MG PO CP24: 10.0000 mg | ORAL_CAPSULE | ORAL | 0 refills | Status: DC

## 2017-05-27 DIAGNOSIS — F4322 Adjustment disorder with anxiety: Secondary | ICD-10-CM | POA: Diagnosis not present

## 2017-05-27 DIAGNOSIS — M5136 Other intervertebral disc degeneration, lumbar region: Secondary | ICD-10-CM | POA: Diagnosis not present

## 2017-06-07 ENCOUNTER — Encounter (INDEPENDENT_AMBULATORY_CARE_PROVIDER_SITE_OTHER): Payer: Self-pay | Admitting: Vascular Surgery

## 2017-06-07 ENCOUNTER — Ambulatory Visit (INDEPENDENT_AMBULATORY_CARE_PROVIDER_SITE_OTHER): Payer: BLUE CROSS/BLUE SHIELD | Admitting: Vascular Surgery

## 2017-06-07 VITALS — BP 107/76 | HR 63 | Resp 16 | Ht 66.0 in | Wt 132.0 lb

## 2017-06-07 DIAGNOSIS — I83811 Varicose veins of right lower extremities with pain: Secondary | ICD-10-CM

## 2017-06-07 NOTE — Progress Notes (Signed)
Varicose veins of right lower extremity     The patient's right lower extremity was sterilely prepped and draped. The ultrasound machine was used to visualize the saphenous vein throughout its course.  This was discontinuous around the level of the knee and the lower thigh and so 2 separate accesses were used to get the treatment of the saphenous vein above and below this area.  The vein was also occluded in the proximal thigh from previous ablation.  A segment in the mid calf was selected for access first. The saphenous vein was accessed without difficulty using ultrasound guidance with a micro puncture needle. A micro puncture wire and sheath were then placed. A 0.018 wire was placed through the sheath and the micro puncture sheath was removed. The 65 cm sheath was then placed over the wire and the wire and dilator were removed. The laser fiber was placed through the sheath and its tip was placed around the level of the knee. Tumescent anesthesia was then created with a dilute lidocaine solution. Laser energy was then delivered with constant withdrawal of the sheath and laser fiber. Approximately 480 joules of energy were delivered over a length of 9 cm using the 1470 Hz [employer] machine at [employer].  I then accessed the saphenous vein in the mid to distal thigh area under direct ultrasound guidance.  A micropuncture wire was placed and then the sheath was placed over the wire.  Tumescent anesthesia was created and the vein was treated in the mid thigh over a length of about 7 cm for an additional 244 J.  Sterile dressings were placed. The patient tolerated the procedure well without complications.

## 2017-06-09 ENCOUNTER — Ambulatory Visit (INDEPENDENT_AMBULATORY_CARE_PROVIDER_SITE_OTHER): Payer: BLUE CROSS/BLUE SHIELD

## 2017-06-09 DIAGNOSIS — I83811 Varicose veins of right lower extremities with pain: Secondary | ICD-10-CM

## 2017-06-10 ENCOUNTER — Encounter (INDEPENDENT_AMBULATORY_CARE_PROVIDER_SITE_OTHER): Payer: BLUE CROSS/BLUE SHIELD

## 2017-07-05 ENCOUNTER — Ambulatory Visit (INDEPENDENT_AMBULATORY_CARE_PROVIDER_SITE_OTHER): Payer: BLUE CROSS/BLUE SHIELD | Admitting: Vascular Surgery

## 2017-07-05 ENCOUNTER — Encounter (INDEPENDENT_AMBULATORY_CARE_PROVIDER_SITE_OTHER): Payer: Self-pay | Admitting: Vascular Surgery

## 2017-07-05 ENCOUNTER — Other Ambulatory Visit: Payer: Self-pay | Admitting: Family Medicine

## 2017-07-05 VITALS — BP 116/78 | HR 64 | Resp 17 | Wt 130.0 lb

## 2017-07-05 DIAGNOSIS — F908 Attention-deficit hyperactivity disorder, other type: Secondary | ICD-10-CM

## 2017-07-05 DIAGNOSIS — I83811 Varicose veins of right lower extremities with pain: Secondary | ICD-10-CM | POA: Diagnosis not present

## 2017-07-05 MED ORDER — AMPHETAMINE-DEXTROAMPHET ER 10 MG PO CP24: 10.0000 mg | ORAL_CAPSULE | ORAL | 0 refills | Status: DC

## 2017-07-05 MED ORDER — AMPHETAMINE-DEXTROAMPHETAMINE 10 MG PO TABS: 10.0000 mg | ORAL_TABLET | Freq: Every day | ORAL | 0 refills | Status: DC

## 2017-07-05 NOTE — Assessment & Plan Note (Signed)
Recommend:  The patient has had successful ablation of the previously incompetent right saphenous venous system but still has persistent symptoms of pain and swelling that are having a negative impact on daily life and daily activities.  Patient should undergo injection sclerotherapy to treat the residual varicosities.  The risks, benefits and alternative therapies were reviewed in detail with the patient.  All questions were answered.  The patient agrees to proceed with sclerotherapy at their convenience.  The patient will continue wearing the graduated compression stockings and using the over-the-counter pain medications to treat her symptoms.      

## 2017-07-05 NOTE — Patient Instructions (Signed)
Sclerotherapy Sclerotherapy is a procedure that is done to improve the appearance of varicose veins and spider veins and to help relieve aching, swelling, cramping, and pain in the legs. Varicose veins are veins that have become enlarged, bulging, and twisted due to a damaged valve that causes blood to collect (pool) in the veins. Spider veins are small varicose veins. Sclerotherapy usually works best for smaller spider and varicose veins. This procedure involves injecting a chemical into the vein to close it off. You may need more than one treatment to close a vein all the way. Sclerotherapy is usually performed on the legs because that is where varicose and spider veins most often occur. Tell a health care provider about:  Any allergies you have.  All medicines you are taking, including vitamins, herbs, eye drops, creams, and over-the-counter medicines.  Any blood disorders you have.  Any surgeries you have had.  Any medical conditions you have.  Whether you are pregnant or may be pregnant. What are the risks? Generally, this is a safe procedure. However, problems may occur, including:  Infection.  Bleeding.  Allergic reactions to medicines or dyes.  Blood clots.  Nerve damage.  Bruising and scarring.  Darkened skin around the area.  What happens before the procedure?  Do not use lotions or creams on your legs unless your health care provider approves.  Follow instructions from your health care provider about eating and drinking restrictions.  Do not use any products that contain nicotine or tobacco, such as cigarettes and e-cigarettes. If you need help quitting, ask your health care provider.  Ask your health care provider about: ? Changing or stopping your regular medicines. This is especially important if you are taking diabetes medicines or blood thinners. ? Taking medicines such as aspirin and ibuprofen. These medicines can thin your blood. Do not take these  medicines before your procedure if your health care provider instructs you not to.  You may have an ultrasound of the affected area to check for blood clots and to check blood flow.  In rare cases, you may have an X-ray procedure to check how blood flows through your veins (angiogram). For an angiogram, a dye is injected to outline your veins on X-rays. What happens during the procedure?  To lower your risk of infection: ? Your health care team will wash or sanitize their hands. ? Your skin will be washed with soap. ? Hair may be removed from the treatment area.  A small, thin needle will be used to inject a chemical (sclerosant) into your varicose vein. The sclerosant will irritate the lining of the vein and cause the vein to close below the injection site. You may feel some stinging, burning, or irritation.  The injection may be repeated for more than one varicose vein.  The injection area will be wrapped with elastic bandages. The procedure may vary among health care providers and hospitals. What happens after the procedure?  Your injection area will be wrapped with elastic bandages. If there is bleeding, the bandages may be changed.  Do not drive until your health care provider approves. You may need to wait 1-2 days before driving.  You will need to wear compression stockings for about a week, or as long as your health care provider recommends. Summary  Sclerotherapy is a procedure that is done to improve the appearance of varicose veins and spider veins and to help relieve aching, swelling, cramping, and pain in the legs.  A small, thin needle is   used to inject a chemical (sclerosant) into a spider vein or varicose vein to close it off.  Elastic bandages will be wrapped around the injection area after the procedure. This information is not intended to replace advice given to you by your health care provider. Make sure you discuss any questions you have with your health care  provider. Document Released: 08/31/2016 Document Revised: 08/31/2016 Document Reviewed: 08/31/2016 Elsevier Interactive Patient Education  2018 [employer]  

## 2017-07-05 NOTE — Telephone Encounter (Signed)
Patient needs to get her 28 day script.    She would like for someone to give her a call when it is upfront.  Thanks  [phone]

## 2017-07-05 NOTE — Progress Notes (Signed)
MRN : [id number]  Jacqueline LoaderKaren E Howell is a 41 y.o. (Nov 19, 1975) female who presents with chief complaint of  Chief Complaint  Patient presents with  . Follow-up    3-4wk post laser  .  History of Present Illness: Patient returns in follow up for her venous disease.  She is about 4 weeks status post right residual great saphenous vein laser ablation.  She still has some soreness and bruising although it is mostly in the calf at this point.  She is bothered by somewhat prominent painful residual varicosities on the medial aspect of the leg.  Her postprocedural duplex showed no evidence of DVT with successful ablation.    Past Medical History:  Diagnosis Date  . Depression     Past Surgical History:  Procedure Laterality Date  . ABLATION Right     Social History Social History   Tobacco Use  . Smoking status: Never Smoker  . Smokeless tobacco: Never Used  Substance Use Topics  . Alcohol use: No  . Drug use: No     Family History Family History  Problem Relation Age of Onset  . Thyroid disease Mother   . Meniere's disease Father   . Breast cancer Neg Hx      Current Outpatient Medications  Medication Sig Dispense Refill  . amphetamine-dextroamphetamine (ADDERALL XR) 10 MG 24 hr capsule Take 1 capsule (10 mg total) by mouth every morning. 28 capsule 0  . amphetamine-dextroamphetamine (ADDERALL) 10 MG tablet Take 1 tablet (10 mg total) by mouth daily. 28 tablet 0  . clonazePAM (KLONOPIN) 1 MG tablet Take 1 tablet (1 mg total) by mouth daily. 28 tablet 0  . sertraline (ZOLOFT) 50 MG tablet Take 1 tablet (50 mg total) by mouth daily. 90 tablet 3  . traZODone (DESYREL) 50 MG tablet Take 1 tablet (50 mg total) by mouth at bedtime. 30 tablet 11   No current facility-administered medications for this visit.     No Known Allergies   REVIEW OF SYSTEMS (Negative unless checked)  Constitutional: [] Weight loss  [] Fever  [] Chills Cardiac: [] Chest pain   [] Chest pressure    [] Palpitations   [] Shortness of breath when laying flat   [] Shortness of breath at rest   [] Shortness of breath with exertion. Vascular:  [] Pain in legs with walking   [] Pain in legs at rest   [] Pain in legs when laying flat   [] Claudication   [] Pain in feet when walking  [] Pain in feet at rest  [] Pain in feet when laying flat   [] History of DVT   [] Phlebitis   [x] Swelling in legs   [x] Varicose veins   [] Non-healing ulcers Pulmonary:   [] Uses home oxygen   [] Productive cough   [] Hemoptysis   [] Wheeze  [] COPD    Neurologic:  [] Dizziness  [] Blackouts   [] Seizures   [] History of stroke   [] History of TIA  [] Aphasia   [] Temporary blindness   [] Dysphagia   [] Weakness or numbness in arms   [] Weakness or numbness in legs Musculoskeletal:  [] Arthritis   [] Joint swelling   [] Joint pain   [] Low back pain Hematologic:  [] Easy bruising  [] Easy bleeding   [] Hypercoagulable state   [] Anemic  [] Thrombocytopenia Gastrointestinal:  [] Blood in stool   [] Vomiting blood  [] Gastroesophageal reflux/heartburn   [] Difficulty swallowing. Genitourinary:  [] Chronic kidney disease   [] Difficult urination  [] Frequent urination  [] Burning with urination   [] Blood in urine Skin:  [] Rashes   [] Ulcers   [] Wounds Psychological:  [] History of  anxiety   []  History of major depression.  Physical Examination  Vitals:   07/05/17 1509  BP: 116/78  Pulse: 64  Resp: 17  Weight: 130 lb (59 kg)   Body mass index is 20.98 kg/m. Gen:  WD/WN, NAD, appears younger than stated age Head: /AT, No temporalis wasting. Ear/Nose/Throat: Hearing grossly intact, dentition good, trachea midline Eyes: Conjunctiva clear. Sclera non-icteric Neck: Supple.  No JVD. Trachea midline Pulmonary:  Good air movement, respirations not labored, no use of accessory muscles.  Cardiac: RRR, normal S1, S2. Vascular:  Vessel Right Left  Radial Palpable Palpable                                   Gastrointestinal: soft, non-tender/non-distended. No  guarding/reflex.  Musculoskeletal: M/S 5/5 throughout.  No deformity or atrophy. No edema.  Somewhat prominent residual varicosities on the medial aspect of the right leg measuring about 2-2-1/2 mm in diameter Neurologic: Sensation grossly intact in extremities.  Symmetrical.  Speech is fluent. Psychiatric: Judgment intact, Mood & affect appropriate for pt's clinical situation. Dermatologic: No rashes or ulcers noted.  No cellulitis or open wounds.      Labs No results found for this or any previous visit (from the past 2160 hour(s)).  Radiology No results found.    Assessment/Plan  Varicose veins of right lower extremity Recommend:  The patient has had successful ablation of the previously incompetent right saphenous venous system but still has persistent symptoms of pain and swelling that are having a negative impact on daily life and daily activities.  Patient should undergo injection sclerotherapy to treat the residual varicosities.  The risks, benefits and alternative therapies were reviewed in detail with the patient.  All questions were answered.  The patient agrees to proceed with sclerotherapy at their convenience.  The patient will continue wearing the graduated compression stockings and using the over-the-counter pain medications to treat her symptoms.         Festus BarrenJason Ali Mohl, MD  07/05/2017 3:36 PM    This note was created with Dragon medical transcription system.  Any errors from dictation are purely unintentional

## 2017-07-05 NOTE — Telephone Encounter (Signed)
Called pt. In regards to medication being ready

## 2017-07-07 ENCOUNTER — Other Ambulatory Visit: Payer: Self-pay | Admitting: Family Medicine

## 2017-07-07 DIAGNOSIS — F331 Major depressive disorder, recurrent, moderate: Secondary | ICD-10-CM

## 2017-07-07 MED ORDER — CLONAZEPAM 1 MG PO TABS: 1.0000 mg | ORAL_TABLET | Freq: Every day | ORAL | 0 refills | Status: DC

## 2017-07-07 MED ORDER — SERTRALINE HCL 50 MG PO TABS: 50.0000 mg | ORAL_TABLET | Freq: Every day | ORAL | 3 refills | Status: DC

## 2017-07-07 NOTE — Telephone Encounter (Signed)
Patient needs her script for her clonazepam 1mg  and also would like for her sertraline (Zoloft) to be [facility]ed [facility] [place]  She would like a call when we get her script ready for her clonazepam.  She asked that we let her husband pick up the script when ready.  Thanks  pts name is Loss adjuster, charteredAitor Ducey

## 2017-08-03 ENCOUNTER — Other Ambulatory Visit: Payer: Self-pay | Admitting: Family Medicine

## 2017-08-03 DIAGNOSIS — F908 Attention-deficit hyperactivity disorder, other type: Secondary | ICD-10-CM

## 2017-08-03 DIAGNOSIS — F331 Major depressive disorder, recurrent, moderate: Secondary | ICD-10-CM

## 2017-08-03 MED ORDER — CLONAZEPAM 1 MG PO TABS: 1.0000 mg | ORAL_TABLET | Freq: Every day | ORAL | 0 refills | Status: DC

## 2017-08-03 MED ORDER — AMPHETAMINE-DEXTROAMPHETAMINE 10 MG PO TABS: 10.0000 mg | ORAL_TABLET | Freq: Every day | ORAL | 0 refills | Status: DC

## 2017-08-03 MED ORDER — AMPHETAMINE-DEXTROAMPHET ER 10 MG PO CP24: 10.0000 mg | ORAL_CAPSULE | ORAL | 0 refills | Status: DC

## 2017-08-11 ENCOUNTER — Encounter (INDEPENDENT_AMBULATORY_CARE_PROVIDER_SITE_OTHER): Payer: Self-pay | Admitting: Vascular Surgery

## 2017-08-11 ENCOUNTER — Ambulatory Visit (INDEPENDENT_AMBULATORY_CARE_PROVIDER_SITE_OTHER): Payer: BLUE CROSS/BLUE SHIELD | Admitting: Vascular Surgery

## 2017-08-11 VITALS — BP 108/71 | HR 70 | Resp 18 | Wt 132.0 lb

## 2017-08-11 DIAGNOSIS — I83811 Varicose veins of right lower extremities with pain: Secondary | ICD-10-CM | POA: Diagnosis not present

## 2017-08-11 NOTE — Progress Notes (Signed)
Varicose veins of right  lower extremity with inflammation (454.1  I83.10) Current Plans   Indication: Patient presents with symptomatic varicose veins of the right  lower extremity.   Procedure: Sclerotherapy using hypertonic saline mixed with 1% Lidocaine was performed on the right lower extremity. Compression wraps were placed. The patient tolerated the procedure well. 

## 2017-08-12 ENCOUNTER — Telehealth: Payer: Self-pay

## 2017-08-22 NOTE — Telephone Encounter (Signed)
Call pt ask her how many she needs 28-day prescriptions supply all on the same day

## 2017-08-22 NOTE — Telephone Encounter (Signed)
Patient always has had name brand RX. No change.

## 2017-08-22 NOTE — Telephone Encounter (Signed)
Prior authorization for amphetamine ER was denied. The insurance prefers brand name Adderall XR instead. Will need to switch to name brand.

## 2017-08-25 ENCOUNTER — Ambulatory Visit (INDEPENDENT_AMBULATORY_CARE_PROVIDER_SITE_OTHER): Payer: BLUE CROSS/BLUE SHIELD | Admitting: Vascular Surgery

## 2017-08-25 ENCOUNTER — Encounter (INDEPENDENT_AMBULATORY_CARE_PROVIDER_SITE_OTHER): Payer: Self-pay | Admitting: Vascular Surgery

## 2017-08-25 VITALS — BP 125/76 | HR 78 | Resp 16 | Wt 134.0 lb

## 2017-08-25 DIAGNOSIS — I83811 Varicose veins of right lower extremities with pain: Secondary | ICD-10-CM

## 2017-08-25 DIAGNOSIS — I872 Venous insufficiency (chronic) (peripheral): Secondary | ICD-10-CM | POA: Diagnosis not present

## 2017-08-25 NOTE — Progress Notes (Signed)
Varicose veins of right  lower extremity with inflammation (454.1  I83.10) Current Plans   Indication: Patient presents with symptomatic varicose veins of the right  lower extremity.   Procedure: Sclerotherapy using hypertonic saline mixed with 1% Lidocaine was performed on the right lower extremity. Compression wraps were placed. The patient tolerated the procedure well. 

## 2017-08-29 ENCOUNTER — Other Ambulatory Visit: Payer: Self-pay | Admitting: Family Medicine

## 2017-08-29 DIAGNOSIS — F908 Attention-deficit hyperactivity disorder, other type: Secondary | ICD-10-CM

## 2017-08-29 DIAGNOSIS — F331 Major depressive disorder, recurrent, moderate: Secondary | ICD-10-CM

## 2017-08-29 MED ORDER — AMPHETAMINE-DEXTROAMPHET ER 10 MG PO CP24: 10.0000 mg | ORAL_CAPSULE | ORAL | 0 refills | Status: DC

## 2017-08-29 MED ORDER — CLONAZEPAM 1 MG PO TABS: 1.0000 mg | ORAL_TABLET | Freq: Every day | ORAL | 0 refills | Status: DC

## 2017-08-29 MED ORDER — AMPHETAMINE-DEXTROAMPHETAMINE 10 MG PO TABS: 10.0000 mg | ORAL_TABLET | Freq: Every day | ORAL | 0 refills | Status: DC

## 2017-09-15 ENCOUNTER — Ambulatory Visit (INDEPENDENT_AMBULATORY_CARE_PROVIDER_SITE_OTHER): Payer: BLUE CROSS/BLUE SHIELD | Admitting: Vascular Surgery

## 2017-09-15 ENCOUNTER — Encounter (INDEPENDENT_AMBULATORY_CARE_PROVIDER_SITE_OTHER): Payer: Self-pay | Admitting: Vascular Surgery

## 2017-09-15 VITALS — BP 120/70 | HR 69 | Resp 17 | Wt 135.0 lb

## 2017-09-15 DIAGNOSIS — I83811 Varicose veins of right lower extremities with pain: Secondary | ICD-10-CM | POA: Diagnosis not present

## 2017-09-15 NOTE — Progress Notes (Signed)
Varicose veins of right  lower extremity with inflammation (454.1  I83.10) Current Plans   Indication: Patient presents with symptomatic varicose veins of the Right lower extremity.   Procedure: Sclerotherapy using hypertonic saline mixed with 1% Lidocaine was performed on the Right lower extremity. Compression wraps were placed. The patient tolerated the procedure well.

## 2017-09-16 ENCOUNTER — Other Ambulatory Visit: Payer: Self-pay

## 2017-09-16 DIAGNOSIS — F331 Major depressive disorder, recurrent, moderate: Secondary | ICD-10-CM

## 2017-09-16 MED ORDER — TRAZODONE HCL 50 MG PO TABS: 50.0000 mg | ORAL_TABLET | Freq: Every day | ORAL | 3 refills | Status: DC

## 2017-09-16 NOTE — Telephone Encounter (Signed)
Pt requesting 90 day supply on Trazadone. Please advise.

## 2017-09-27 ENCOUNTER — Other Ambulatory Visit: Payer: Self-pay | Admitting: Family Medicine

## 2017-09-27 DIAGNOSIS — F908 Attention-deficit hyperactivity disorder, other type: Secondary | ICD-10-CM

## 2017-09-27 DIAGNOSIS — F331 Major depressive disorder, recurrent, moderate: Secondary | ICD-10-CM

## 2017-09-27 MED ORDER — AMPHETAMINE-DEXTROAMPHETAMINE 10 MG PO TABS: 10.0000 mg | ORAL_TABLET | Freq: Every day | ORAL | 0 refills | Status: DC

## 2017-09-27 MED ORDER — CLONAZEPAM 1 MG PO TABS: 1.0000 mg | ORAL_TABLET | Freq: Every day | ORAL | 0 refills | Status: DC

## 2017-09-27 MED ORDER — AMPHETAMINE-DEXTROAMPHET ER 10 MG PO CP24: 10.0000 mg | ORAL_CAPSULE | ORAL | 0 refills | Status: DC

## 2017-10-11 ENCOUNTER — Other Ambulatory Visit: Payer: Self-pay | Admitting: Family Medicine

## 2017-10-11 DIAGNOSIS — F331 Major depressive disorder, recurrent, moderate: Secondary | ICD-10-CM

## 2017-10-11 DIAGNOSIS — F908 Attention-deficit hyperactivity disorder, other type: Secondary | ICD-10-CM

## 2017-10-11 MED ORDER — CLONAZEPAM 1 MG PO TABS: 1.0000 mg | ORAL_TABLET | Freq: Every day | ORAL | 0 refills | Status: DC

## 2017-10-11 MED ORDER — AMPHETAMINE-DEXTROAMPHETAMINE 10 MG PO TABS: 10.0000 mg | ORAL_TABLET | Freq: Every day | ORAL | 0 refills | Status: DC

## 2017-10-11 MED ORDER — AMPHETAMINE-DEXTROAMPHET ER 10 MG PO CP24: 10.0000 mg | ORAL_CAPSULE | ORAL | 0 refills | Status: DC

## 2017-10-20 DIAGNOSIS — J019 Acute sinusitis, unspecified: Secondary | ICD-10-CM | POA: Diagnosis not present

## 2017-10-20 DIAGNOSIS — J309 Allergic rhinitis, unspecified: Secondary | ICD-10-CM | POA: Diagnosis not present

## 2017-10-28 ENCOUNTER — Ambulatory Visit
Admission: RE | Admit: 2017-10-28 | Discharge: 2017-10-28 | Disposition: A | Discharge status: Home / Self Care | Payer: BLUE CROSS/BLUE SHIELD | Source: Ambulatory Visit | Attending: Emergency Medicine | Admitting: Emergency Medicine

## 2017-10-28 ENCOUNTER — Ambulatory Visit: Payer: BLUE CROSS/BLUE SHIELD

## 2017-10-28 ENCOUNTER — Ambulatory Visit
Admission: EM | Admit: 2017-10-28 | Discharge: 2017-10-28 | Disposition: A | Discharge status: Home / Self Care | Payer: BLUE CROSS/BLUE SHIELD | Attending: Emergency Medicine | Admitting: Emergency Medicine

## 2017-10-28 ENCOUNTER — Other Ambulatory Visit: Payer: Self-pay

## 2017-10-28 DIAGNOSIS — M79604 Pain in right leg: Secondary | ICD-10-CM | POA: Diagnosis not present

## 2017-10-28 DIAGNOSIS — I824Z2 Acute embolism and thrombosis of unspecified deep veins of left distal lower extremity: Secondary | ICD-10-CM | POA: Insufficient documentation

## 2017-10-28 DIAGNOSIS — R21 Rash and other nonspecific skin eruption: Secondary | ICD-10-CM

## 2017-10-28 MED ORDER — IBUPROFEN 600 MG PO TABS: 600.0000 mg | ORAL_TABLET | Freq: Four times a day (QID) | ORAL | 0 refills | Status: DC | PRN

## 2017-10-28 NOTE — Discharge Instructions (Signed)
I am giving you information on DVT, a blood clot in your leg.  I am not sure what the rash is coming from, but it does not appear to be infectious or from a circulation problem.  I would watch it for the next several days.  We will schedule your ultrasound.  Please wait at the ultrasound facility until I talk with the tach or the radiologist and we will then discuss what to do.  If he had a DVT, I generally send people to the emergency room.  If it is negative for DVT, this pain is most likely musculoskeletal.  I would recommend ibuprofen 600 mg take with 1 g of Tylenol 3 or 4 times a day.  This together.  Ice this for 20 minutes at a time.

## 2017-10-28 NOTE — ED Triage Notes (Signed)
Patient complains of discoloration that occurred on Saturday on the back of her right leg.. Patient states that she had vein surgery on her right leg in november. Patient states that leg started hurting yesterday and she is concerned for a blood clot.

## 2017-10-28 NOTE — ED Provider Notes (Signed)
HPI  SUBJECTIVE:  Jacqueline Howell is a 42 y.o. female who presents with a nonpruritic, flat, nonpainful rash of her right posterior thighs for the past 3 days.  She reports a constant, throbbing, achy pain along her medial thigh.  She denies trauma, localized erythema, color changes, lower extremity edema, calf pain, recent immobilization.  No surgery in the past 4 weeks.  No fevers.  She tried Tylenol without improvement in symptoms.  No aggravating factors.  She is currently on Omnicef for sinusitis.  She wants to make sure that this is not a DVT.  Past medical history negative for DVT, PE, hypercoagulability, cancer.  She has a past medical history of varicose veins.  LMP: Now.  Denies possibility of being pregnant.  PMD: Steele Sizer, MD .Vascular: Dr. Wyn Quaker  Sclerotherapy on 09/15/2017.  Great saphenous vein ablation on 07/05/2017.  Had a post procedure duplex which was negative for DVT.  Past Medical History:  Diagnosis Date  . Depression     Past Surgical History:  Procedure Laterality Date  . ABLATION Right     Family History  Problem Relation Age of Onset  . Thyroid disease Mother   . Meniere's disease Father   . Breast cancer Neg Hx     Social History   Tobacco Use  . Smoking status: Never Smoker  . Smokeless tobacco: Never Used  Substance Use Topics  . Alcohol use: No  . Drug use: No    No current facility-administered medications for this encounter.   Current Outpatient Medications:  .  amphetamine-dextroamphetamine (ADDERALL XR) 10 MG 24 hr capsule, Take 1 capsule (10 mg total) by mouth every morning., Disp: 28 capsule, Rfl: 0 .  amphetamine-dextroamphetamine (ADDERALL) 10 MG tablet, Take 1 tablet (10 mg total) by mouth daily., Disp: 28 tablet, Rfl: 0 .  clonazePAM (KLONOPIN) 1 MG tablet, Take 1 tablet (1 mg total) by mouth daily., Disp: 28 tablet, Rfl: 0 .  traZODone (DESYREL) 50 MG tablet, Take 1 tablet (50 mg total) by mouth at bedtime., Disp: 90 tablet, Rfl:  3 .  ibuprofen (ADVIL,MOTRIN) 600 MG tablet, Take 1 tablet (600 mg total) by mouth every 6 (six) hours as needed., Disp: 30 tablet, Rfl: 0  Allergies  Allergen Reactions  . Biaxin [Clarithromycin]     Migraines, vomiting     ROS  As noted in HPI.   Physical Exam  BP 127/87 (BP Location: Left Arm)   Pulse 71   Temp 97.9 F (36.6 C) (Oral)   Resp 18   Ht 5\' 6"  (1.676 m)   Wt 130 lb (59 kg)   LMP 10/27/2017   SpO2 100%   BMI 20.98 kg/m   Constitutional: Well developed, well nourished, no acute distress Eyes:  EOMI, conjunctiva normal bilaterally HENT: Normocephalic, atraumatic,mucus membranes moist Respiratory: Normal inspiratory effort Cardiovascular: Normal rate GI: nondistended skin: Positive hyperpigmented nontender, blanchable rash posterior right thigh.  No vesicles.  No increased temperature, no induration. see Picture   Musculoskeletal: Calves symmetric, nontender.  No popliteal tenderness.  Positive tenderness along the greater medial saphenous vein superior to the knee RLE.  Questionable cord.  No discoloration.  DP 2+. Neurologic: Alert & oriented x 3, no focal neuro deficits Psychiatric: Speech and behavior appropriate   ED Course   Medications - No data to display  Orders Placed This Encounter  Procedures  . US Venous Img Lower Unilateral Right    Standing Status:   Future    Number of  Occurrences:   1    Standing Expiration Date:   12/29/2018    Order Specific Question:   Reason for Exam (SYMPTOM  OR DIAGNOSIS REQUIRED)    Answer:   r/o dvt//Patient complains of discoloration that occurred on Saturday on the back of her right leg.. Patient states that she had vein surgery on her right leg in november. Patient states that leg started hurting yesterday and she is concerned for a blood clot.     Order Specific Question:   Preferred imaging location?    Answer:   [facility]    No results found for this or any previous visit (from the past 24  hour(s)). [place] Venous Img Lower Unilateral Right  Result Date: 10/28/2017 CLINICAL DATA:  Right leg pain and discoloration EXAM: RIGHT LOWER EXTREMITY VENOUS DOPPLER ULTRASOUND TECHNIQUE: Gray-scale sonography with graded compression, as well as color Doppler and duplex ultrasound were performed to evaluate the lower extremity deep venous systems from the level of the common femoral vein and including the common femoral, femoral, profunda femoral, popliteal and calf veins including the posterior tibial, peroneal and gastrocnemius veins when visible. The superficial great saphenous vein was also interrogated. Spectral Doppler was utilized to evaluate flow at rest and with distal augmentation maneuvers in the common femoral, femoral and popliteal veins. COMPARISON:  None. FINDINGS: Contralateral Common Femoral Vein: Respiratory phasicity is normal and symmetric with the symptomatic side. No evidence of thrombus. Normal compressibility. Common Femoral Vein: No evidence of thrombus. Normal compressibility, respiratory phasicity and response to augmentation. Saphenofemoral Junction: No evidence of thrombus. Normal compressibility and flow on color Doppler imaging. Profunda Femoral Vein: No evidence of thrombus. Normal compressibility and flow on color Doppler imaging. Femoral Vein: No evidence of thrombus. Normal compressibility, respiratory phasicity and response to augmentation. Popliteal Vein: No evidence of thrombus. Normal compressibility, respiratory phasicity and response to augmentation. Calf Veins: No evidence of thrombus. Normal compressibility and flow on color Doppler imaging. Superficial Great Saphenous Vein: Right distal GSV in the calf region demonstrates superficial thrombosis with intraluminal thrombus and is noncompressible. This is over a short segment. Distal GSV in the lower calf and ankle region is patent. Venous Reflux:  None. Other Findings:  None. IMPRESSION: Negative for right lower extremity  DVT. Distal left GSV superficial thrombosis in the calf region. Electronically Signed   By: Judie PetitM.  Shick M.D.   On: 10/28/2017 11:14    ED Clinical Impression  Leg pain, medial, right  Rash   ED Assessment/Plan  Outside records reviewed.  As noted in HPI.  Unsure as to the etiology of the rash, but does not appear to be infectious or a significant vascular cause. Will have her Observe this.  She does have tenderness along the greater saphenous vein, so we will order an outpatient ultrasound to rule out DVT.  If it is positive for DVT, will send to the emergency department.  If not, suspect that this is most likely musculoskeletal and will send home with ibuprofen 600 mg to take with 1 g of Tylenol 3-4 times a day.   Ultrasound report reviewed.  No right lower extremity DVT.  Right distal greater saphenous vein in the calf region with superficial thrombosis.   1125- Called pt. Discussed negative DVT ultrasound result, positive superficial venous thrombosis in greater saphenous vein on the right side with the patient.  Recommended warm compresses, regular NSAIDs.  Patient was prescribed 600 mg ibuprofen already.  She will follow-up with her primary care physician or  with her vascular surgeon as needed.  Discussed imaging, medical decision making, plan and plan for follow-up with patient.  She agrees with plan.  Meds ordered this encounter  Medications  . ibuprofen (ADVIL,MOTRIN) 600 MG tablet    Sig: Take 1 tablet (600 mg total) by mouth every 6 (six) hours as needed.    Dispense:  30 tablet    Refill:  0    *This clinic note was created using Scientist, clinical (histocompatibility and immunogenetics). Therefore, there may be occasional mistakes despite careful proofreading.   ?   Domenick Gong, MD 10/28/17 1128

## 2017-11-01 ENCOUNTER — Other Ambulatory Visit: Payer: Self-pay | Admitting: Family Medicine

## 2017-11-01 DIAGNOSIS — F908 Attention-deficit hyperactivity disorder, other type: Secondary | ICD-10-CM

## 2017-11-01 DIAGNOSIS — F331 Major depressive disorder, recurrent, moderate: Secondary | ICD-10-CM

## 2017-11-02 ENCOUNTER — Ambulatory Visit: Payer: BLUE CROSS/BLUE SHIELD | Admitting: Family Medicine

## 2017-11-02 ENCOUNTER — Encounter: Payer: Self-pay | Admitting: Family Medicine

## 2017-11-02 DIAGNOSIS — F908 Attention-deficit hyperactivity disorder, other type: Secondary | ICD-10-CM | POA: Diagnosis not present

## 2017-11-02 DIAGNOSIS — F331 Major depressive disorder, recurrent, moderate: Secondary | ICD-10-CM | POA: Diagnosis not present

## 2017-11-02 DIAGNOSIS — F419 Anxiety disorder, unspecified: Secondary | ICD-10-CM | POA: Diagnosis not present

## 2017-11-02 DIAGNOSIS — I872 Venous insufficiency (chronic) (peripheral): Secondary | ICD-10-CM | POA: Diagnosis not present

## 2017-11-02 MED ORDER — AMPHETAMINE-DEXTROAMPHET ER 10 MG PO CP24: 10.0000 mg | ORAL_CAPSULE | ORAL | 0 refills | Status: DC

## 2017-11-02 MED ORDER — CLONAZEPAM 1 MG PO TABS: 1.0000 mg | ORAL_TABLET | Freq: Every day | ORAL | 0 refills | Status: DC

## 2017-11-02 MED ORDER — AMPHETAMINE-DEXTROAMPHETAMINE 10 MG PO TABS: 10.0000 mg | ORAL_TABLET | Freq: Every day | ORAL | 0 refills | Status: DC

## 2017-11-02 MED ORDER — SERTRALINE HCL 50 MG PO TABS: 50.0000 mg | ORAL_TABLET | Freq: Every day | ORAL | 2 refills | Status: DC

## 2017-11-02 NOTE — Assessment & Plan Note (Signed)
The current medical regimen is effective;  continue present plan and medications.  

## 2017-11-02 NOTE — Progress Notes (Signed)
   BP 116/77   Pulse 74   Ht 5\' 6"  (1.676 m)   Wt 136 lb (61.7 kg)   LMP 10/27/2017   SpO2 98%   BMI 21.95 kg/m    Subjective:    Patient ID: Jacqueline Howell, female    DOB: 15-Aug-1975, 42 y.o.   MRN: [id number]  HPI: Jacqueline LoaderKaren E Aldaz is a 42 y.o. female  Chief Complaint  Patient presents with  . Follow-up  . Depression  Patient with continuing great deal of stress and anxiety in her life tried to stop Zoloft realized after a week that is not a good idea and is back on Zoloft without problems.  Does need a refill anxiety still a great deal of stress Klonopin helps uses 28 in a month along with her Adderall.  Which helps stabilize her mood and affect.  Relevant past medical, surgical, family and social history reviewed and updated as indicated. Interim medical history since our last visit reviewed. Allergies and medications reviewed and updated.  Review of Systems  Constitutional: Negative.   Respiratory: Negative.   Cardiovascular: Negative.     Per HPI unless specifically indicated above     Objective:    BP 116/77   Pulse 74   Ht 5\' 6"  (1.676 m)   Wt 136 lb (61.7 kg)   LMP 10/27/2017   SpO2 98%   BMI 21.95 kg/m   Wt Readings from Last 3 Encounters:  11/02/17 136 lb (61.7 kg)  10/28/17 130 lb (59 kg)  09/15/17 135 lb (61.2 kg)    Physical Exam  Constitutional: She is oriented to person, place, and time. She appears well-developed and well-nourished. No distress.  HENT:  Head: Normocephalic and atraumatic.  Right Ear: Hearing normal.  Left Ear: Hearing normal.  Nose: Nose normal.  Eyes: Conjunctivae and lids are normal. Right eye exhibits no discharge. Left eye exhibits no discharge. No scleral icterus.  Pulmonary/Chest: Effort normal. No respiratory distress.  Musculoskeletal: Normal range of motion.  Neurological: She is alert and oriented to person, place, and time.  Skin: Skin is intact. No rash noted.  Psychiatric: She has a normal mood and affect. Her  speech is normal and behavior is normal. Judgment and thought content normal. Cognition and memory are normal.    No results found for this or any previous visit.    Assessment & Plan:   Problem List Items Addressed This Visit      Cardiovascular and Mediastinum   Chronic venous insufficiency    Reviewed issues and discussed vascular surgery with patient        Other   ADHD (attention deficit hyperactivity disorder) (Chronic)    The current medical regimen is effective;  continue present plan and medications.       Relevant Medications   amphetamine-dextroamphetamine (ADDERALL) 10 MG tablet   amphetamine-dextroamphetamine (ADDERALL XR) 10 MG 24 hr capsule   Chronic anxiety (Chronic)    The current medical regimen is effective;  continue present plan and medications.       Relevant Medications   sertraline (ZOLOFT) 50 MG tablet   Depression    The current medical regimen is effective;  continue present plan and medications.       Relevant Medications   sertraline (ZOLOFT) 50 MG tablet   clonazePAM (KLONOPIN) 1 MG tablet       Follow up plan: Return in about 6 months (around 05/04/2018).

## 2017-11-02 NOTE — Assessment & Plan Note (Signed)
Reviewed issues and discussed vascular surgery with patient

## 2017-11-15 ENCOUNTER — Ambulatory Visit (INDEPENDENT_AMBULATORY_CARE_PROVIDER_SITE_OTHER): Payer: BLUE CROSS/BLUE SHIELD | Admitting: Obstetrics and Gynecology

## 2017-11-15 ENCOUNTER — Encounter: Payer: Self-pay | Admitting: Obstetrics and Gynecology

## 2017-11-15 VITALS — BP 110/60 | Ht 66.0 in | Wt 131.0 lb

## 2017-11-15 DIAGNOSIS — Z1151 Encounter for screening for human papillomavirus (HPV): Secondary | ICD-10-CM | POA: Diagnosis not present

## 2017-11-15 DIAGNOSIS — K5904 Chronic idiopathic constipation: Secondary | ICD-10-CM | POA: Diagnosis not present

## 2017-11-15 DIAGNOSIS — Z01419 Encounter for gynecological examination (general) (routine) without abnormal findings: Secondary | ICD-10-CM | POA: Diagnosis not present

## 2017-11-15 DIAGNOSIS — Z124 Encounter for screening for malignant neoplasm of cervix: Secondary | ICD-10-CM | POA: Diagnosis not present

## 2017-11-15 DIAGNOSIS — G43839 Menstrual migraine, intractable, without status migrainosus: Secondary | ICD-10-CM | POA: Diagnosis not present

## 2017-11-15 DIAGNOSIS — Z800 Family history of malignant neoplasm of digestive organs: Secondary | ICD-10-CM | POA: Insufficient documentation

## 2017-11-15 DIAGNOSIS — Z1231 Encounter for screening mammogram for malignant neoplasm of breast: Secondary | ICD-10-CM | POA: Diagnosis not present

## 2017-11-15 DIAGNOSIS — N946 Dysmenorrhea, unspecified: Secondary | ICD-10-CM | POA: Insufficient documentation

## 2017-11-15 DIAGNOSIS — Z1239 Encounter for other screening for malignant neoplasm of breast: Secondary | ICD-10-CM

## 2017-11-15 MED ORDER — NAPROXEN SODIUM 550 MG PO TABS: 550.0000 mg | ORAL_TABLET | Freq: Two times a day (BID) | ORAL | 1 refills | Status: DC

## 2017-11-15 NOTE — Progress Notes (Signed)
PCP:  Steele Sizer, MD   Chief Complaint  Patient presents with  . Gynecologic Exam     HPI:      Ms. Jacqueline Howell is a 42 y.o. [id number] who LMP was Patient's last menstrual period was 10/27/2017., presents today for her annual examination.  Her menses are regular every 28-30 days, lasting 3-5 days, med to heavy flow.  Dysmenorrhea moderate, occurring first 1-2 days of flow and takes "lots of midol" (2 tabs Q3 hrs). Also now has menstrual migraines x 6-8 months and takes excedrin with some improvement. Tried a friend's migraine med but didn't realize it was Rx. Denies headaches other times of month. She does not have intermenstrual bleeding.  Sex activity: single partner, contraception - vasectomy. Partner unfaithful last yr and had neg STD testing. Will repeat pap due to possible exposure.  Last Pap: June 03, 2016  Results were: no abnormalities /neg HPV DNA  Hx of STDs: none  Last mammogram: January 10, 2017  Results were: normal--routine follow-up in 12 months There is no FH of breast cancer. There is no FH of ovarian cancer. The patient does do self-breast exams. There is a FH of colon cancer in her dad at age 58. No colonoscopy done in past.  Tobacco use: The patient denies current or previous tobacco use. Alcohol use: none No drug use.  Exercise: moderately active  She does get adequate calcium and Vitamin D in her diet.  Lipids/labs WNL 11/17.   Pt with severe constipation. Has tried fiber with increased bloating. Does laxative wkly for BM. No relief with increased water, stool softener. Can't do miralax.    Past Medical History:  Diagnosis Date  . Depression     Past Surgical History:  Procedure Laterality Date  . ABLATION Right     Family History  Problem Relation Age of Onset  . Thyroid disease Mother   . Meniere's disease Father   . Colon cancer Father 26  . Breast cancer Neg Hx     Social History   Socioeconomic History  . Marital status:  Married    Spouse name: Not on file  . Number of children: Not on file  . Years of education: Not on file  . Highest education level: Not on file  Occupational History  . Not on file  Social Needs  . Financial resource strain: Not on file  . Food insecurity:    Worry: Not on file    Inability: Not on file  . Transportation needs:    Medical: Not on file    Non-medical: Not on file  Tobacco Use  . Smoking status: Never Smoker  . Smokeless tobacco: Never Used  Substance and Sexual Activity  . Alcohol use: No  . Drug use: No  . Sexual activity: Yes    Birth control/protection: None  Lifestyle  . Physical activity:    Days per week: Not on file    Minutes per session: Not on file  . Stress: Not on file  Relationships  . Social connections:    Talks on phone: Not on file    Gets together: Not on file    Attends religious service: Not on file    Active member of club or organization: Not on file    Attends meetings of clubs or organizations: Not on file    Relationship status: Not on file  . Intimate partner violence:    Fear of current or ex partner: Not on file  Emotionally abused: Not on file    Physically abused: Not on file    Forced sexual activity: Not on file  Other Topics Concern  . Not on file  Social History Narrative  . Not on file    Outpatient Medications Prior to Visit  Medication Sig Dispense Refill  . amphetamine-dextroamphetamine (ADDERALL XR) 10 MG 24 hr capsule Take 1 capsule (10 mg total) by mouth every morning. 28 capsule 0  . amphetamine-dextroamphetamine (ADDERALL) 10 MG tablet Take 1 tablet (10 mg total) by mouth daily. 28 tablet 0  . clonazePAM (KLONOPIN) 1 MG tablet Take 1 tablet (1 mg total) by mouth daily. 28 tablet 0  . ibuprofen (ADVIL,MOTRIN) 600 MG tablet Take 1 tablet (600 mg total) by mouth every 6 (six) hours as needed. 30 tablet 0  . sertraline (ZOLOFT) 50 MG tablet Take 1 tablet (50 mg total) by mouth daily. 90 tablet 2  .  traZODone (DESYREL) 50 MG tablet Take 1 tablet (50 mg total) by mouth at bedtime. 90 tablet 3   No facility-administered medications prior to visit.       ROS:  Review of Systems  Constitutional: Negative for fatigue, fever and unexpected weight change.  Respiratory: Negative for cough, shortness of breath and wheezing.   Cardiovascular: Negative for chest pain, palpitations and leg swelling.  Gastrointestinal: Positive for constipation. Negative for blood in stool, diarrhea, nausea and vomiting.  Endocrine: Negative for cold intolerance, heat intolerance and polyuria.  Genitourinary: Negative for dyspareunia, dysuria, flank pain, frequency, genital sores, hematuria, menstrual problem, pelvic pain, urgency, vaginal bleeding, vaginal discharge and vaginal pain.  Musculoskeletal: Negative for back pain, joint swelling and myalgias.  Skin: Negative for rash.  Neurological: Positive for headaches. Negative for dizziness, syncope, light-headedness and numbness.  Hematological: Negative for adenopathy.  Psychiatric/Behavioral: Negative for agitation, confusion, sleep disturbance and suicidal ideas. The patient is not nervous/anxious.    BREAST: No symptoms   Objective: BP 110/60   Ht 5\' 6"  (1.676 m)   Wt 131 lb (59.4 kg)   LMP 10/27/2017   BMI 21.14 kg/m    Physical Exam  Constitutional: She is oriented to person, place, and time. She appears well-developed and well-nourished.  Genitourinary: Vagina normal and uterus normal. There is no rash or tenderness on the right labia. There is no rash or tenderness on the left labia. No erythema or tenderness in the vagina. No vaginal discharge found. Right adnexum does not display mass and does not display tenderness. Left adnexum does not display mass and does not display tenderness. Cervix does not exhibit motion tenderness or polyp. Uterus is not enlarged or tender.  Neck: Normal range of motion. No thyromegaly present.  Cardiovascular:  Normal rate, regular rhythm and normal heart sounds.  No murmur heard. Pulmonary/Chest: Effort normal and breath sounds normal. Right breast exhibits no mass, no nipple discharge, no skin change and no tenderness. Left breast exhibits no mass, no nipple discharge, no skin change and no tenderness.  Abdominal: Soft. There is no tenderness. There is no guarding.  Musculoskeletal: Normal range of motion.  Neurological: She is alert and oriented to person, place, and time. No cranial nerve deficit.  Psychiatric: She has a normal mood and affect. Her behavior is normal.  Vitals reviewed.   Assessment/Plan: Encounter for annual routine gynecological examination  Cervical cancer screening - Plan: IGP, Aptima HPV  Screening for HPV (human papillomavirus) - Plan: IGP, Aptima HPV  Screening for breast cancer - Pt to sched mammo 6/19 -  Plan: MM DIGITAL SCREENING BILATERAL  Family history of colon cancer - Refer to GI for scr colonoscopy due to FH. Doesn't qualify for cancer genetic testing. - Plan: Ambulatory referral to Gastroenterology  Chronic idiopathic constipation - Add benefiber since has fewer side effects than psyllium. F/u with GI at colonoscopy appt.   Intractable menstrual migraine without status migrainosus - Try Anaprox for dysmen sx as well. Can f/u with PCP for migraine tx Rx prn.  Dysmenorrhea - Rx anaprox. Discussed aleve 2-3 days before sx. F/u prn. Discussed max ibup dose daily - Plan: naproxen sodium (ANAPROX DS) 550 MG tablet  Meds ordered this encounter  Medications  . naproxen sodium (ANAPROX DS) 550 MG tablet    Sig: Take 1 tablet (550 mg total) by mouth 2 (two) times daily with a meal.    Dispense:  30 tablet    Refill:  1    Order Specific Question:   Supervising Provider    Answer:   Nadara Mustard [[id number]]             GYN counsel breast self exam, mammography screening, adequate intake of calcium and vitamin D, diet and exercise     F/U  Return in about 1  year (around 11/16/2018), or if symptoms worsen or fail to improve.  Kyra Laffey B. Elesia Pemberton, PA-C 11/15/2017 11:54 AM

## 2017-11-15 NOTE — Patient Instructions (Addendum)
I value your feedback and entrusting us with your care. If you get a [facility] patient survey, I would appreciate you taking the time to let us know about your experience today. Thank you!  [facility]: [phone]  [facility]: [phone]  

## 2017-11-16 ENCOUNTER — Ambulatory Visit: Payer: BLUE CROSS/BLUE SHIELD | Admitting: Obstetrics and Gynecology

## 2017-11-18 LAB — IGP, APTIMA HPV
HPV Aptima: NEGATIVE
PAP Smear Comment: 0

## 2017-12-01 ENCOUNTER — Ambulatory Visit: Payer: BLUE CROSS/BLUE SHIELD | Admitting: Obstetrics and Gynecology

## 2017-12-08 ENCOUNTER — Ambulatory Visit: Payer: BLUE CROSS/BLUE SHIELD | Admitting: Gastroenterology

## 2017-12-15 ENCOUNTER — Telehealth: Payer: Self-pay | Admitting: Family Medicine

## 2017-12-15 ENCOUNTER — Other Ambulatory Visit: Payer: Self-pay | Admitting: Family Medicine

## 2017-12-15 DIAGNOSIS — F908 Attention-deficit hyperactivity disorder, other type: Secondary | ICD-10-CM

## 2017-12-15 DIAGNOSIS — F331 Major depressive disorder, recurrent, moderate: Secondary | ICD-10-CM

## 2017-12-15 MED ORDER — AMPHETAMINE-DEXTROAMPHETAMINE 10 MG PO TABS: 10.0000 mg | ORAL_TABLET | Freq: Every day | ORAL | 0 refills | Status: DC

## 2017-12-15 MED ORDER — AMPHETAMINE-DEXTROAMPHET ER 10 MG PO CP24: 10.0000 mg | ORAL_CAPSULE | ORAL | 0 refills | Status: DC

## 2017-12-15 MED ORDER — CLONAZEPAM 1 MG PO TABS: 1.0000 mg | ORAL_TABLET | Freq: Every day | ORAL | 0 refills | Status: DC

## 2017-12-15 NOTE — Telephone Encounter (Signed)
Patient needs her monthly script sent to [facility].    Thank you

## 2017-12-15 NOTE — Telephone Encounter (Signed)
Rx sent to her pharmacy 

## 2018-01-14 ENCOUNTER — Encounter: Payer: Self-pay | Admitting: Obstetrics and Gynecology

## 2018-01-14 ENCOUNTER — Other Ambulatory Visit: Payer: Self-pay | Admitting: Family Medicine

## 2018-01-14 ENCOUNTER — Encounter: Payer: Self-pay | Admitting: Family Medicine

## 2018-01-14 DIAGNOSIS — F331 Major depressive disorder, recurrent, moderate: Secondary | ICD-10-CM

## 2018-01-14 DIAGNOSIS — F908 Attention-deficit hyperactivity disorder, other type: Secondary | ICD-10-CM

## 2018-01-16 ENCOUNTER — Other Ambulatory Visit: Payer: Self-pay | Admitting: Family Medicine

## 2018-01-16 DIAGNOSIS — F908 Attention-deficit hyperactivity disorder, other type: Secondary | ICD-10-CM

## 2018-01-16 DIAGNOSIS — F331 Major depressive disorder, recurrent, moderate: Secondary | ICD-10-CM

## 2018-01-16 MED ORDER — AMPHETAMINE-DEXTROAMPHET ER 10 MG PO CP24: 10.0000 mg | ORAL_CAPSULE | ORAL | 0 refills | Status: DC

## 2018-01-16 MED ORDER — AMPHETAMINE-DEXTROAMPHETAMINE 10 MG PO TABS: 10.0000 mg | ORAL_TABLET | Freq: Every day | ORAL | 0 refills | Status: DC

## 2018-01-16 MED ORDER — CLONAZEPAM 1 MG PO TABS: 1.0000 mg | ORAL_TABLET | Freq: Every day | ORAL | 0 refills | Status: DC

## 2018-01-16 NOTE — Telephone Encounter (Signed)
Pt is on the 28 day list for MAC.

## 2018-01-16 NOTE — Telephone Encounter (Signed)
Pls change referral. Thx.

## 2018-01-16 NOTE — Telephone Encounter (Signed)
Klonopin 1 mg refill request      She is in the office seeing Dr. Dossie Arbourrissman now (01/16/18 10:45).

## 2018-02-12 ENCOUNTER — Other Ambulatory Visit: Payer: Self-pay | Admitting: Family Medicine

## 2018-02-12 ENCOUNTER — Encounter: Payer: Self-pay | Admitting: Family Medicine

## 2018-02-12 DIAGNOSIS — F331 Major depressive disorder, recurrent, moderate: Secondary | ICD-10-CM

## 2018-02-13 ENCOUNTER — Encounter: Payer: Self-pay | Admitting: Family Medicine

## 2018-02-13 ENCOUNTER — Other Ambulatory Visit: Payer: Self-pay | Admitting: Family Medicine

## 2018-02-13 DIAGNOSIS — F419 Anxiety disorder, unspecified: Secondary | ICD-10-CM

## 2018-02-13 DIAGNOSIS — F908 Attention-deficit hyperactivity disorder, other type: Secondary | ICD-10-CM

## 2018-02-13 DIAGNOSIS — F331 Major depressive disorder, recurrent, moderate: Secondary | ICD-10-CM

## 2018-02-13 MED ORDER — AMPHETAMINE-DEXTROAMPHETAMINE 10 MG PO TABS: 10.0000 mg | ORAL_TABLET | Freq: Every day | ORAL | 0 refills | Status: DC

## 2018-02-13 MED ORDER — CLONAZEPAM 1 MG PO TABS: 1.0000 mg | ORAL_TABLET | Freq: Every day | ORAL | 0 refills | Status: DC

## 2018-02-13 MED ORDER — ZOLPIDEM TARTRATE 10 MG PO TABS: 10.0000 mg | ORAL_TABLET | Freq: Every evening | ORAL | 0 refills | Status: DC | PRN

## 2018-02-13 MED ORDER — AMPHETAMINE-DEXTROAMPHET ER 10 MG PO CP24: 10.0000 mg | ORAL_CAPSULE | ORAL | 0 refills | Status: DC

## 2018-02-14 NOTE — Telephone Encounter (Signed)
Phone call Discussed case and usage with patient and cautions about habit formation.  Patient will go ahead and use Ambien for as needed rare use.

## 2018-02-22 ENCOUNTER — Ambulatory Visit
Admission: RE | Admit: 2018-02-22 | Discharge: 2018-02-22 | Disposition: A | Discharge status: Home / Self Care | Payer: BLUE CROSS/BLUE SHIELD | Source: Ambulatory Visit | Attending: Obstetrics and Gynecology | Admitting: Obstetrics and Gynecology

## 2018-02-22 ENCOUNTER — Encounter: Payer: Self-pay | Admitting: Obstetrics and Gynecology

## 2018-02-22 DIAGNOSIS — Z1231 Encounter for screening mammogram for malignant neoplasm of breast: Secondary | ICD-10-CM | POA: Diagnosis not present

## 2018-02-22 DIAGNOSIS — Z1239 Encounter for other screening for malignant neoplasm of breast: Secondary | ICD-10-CM

## 2018-02-24 DIAGNOSIS — F4322 Adjustment disorder with anxiety: Secondary | ICD-10-CM | POA: Diagnosis not present

## 2018-02-27 DIAGNOSIS — F4322 Adjustment disorder with anxiety: Secondary | ICD-10-CM | POA: Diagnosis not present

## 2018-03-14 DIAGNOSIS — F5105 Insomnia due to other mental disorder: Secondary | ICD-10-CM | POA: Diagnosis not present

## 2018-03-14 DIAGNOSIS — F902 Attention-deficit hyperactivity disorder, combined type: Secondary | ICD-10-CM | POA: Diagnosis not present

## 2018-03-14 DIAGNOSIS — F4323 Adjustment disorder with mixed anxiety and depressed mood: Secondary | ICD-10-CM | POA: Diagnosis not present

## 2018-03-26 DIAGNOSIS — K589 Irritable bowel syndrome without diarrhea: Secondary | ICD-10-CM

## 2018-03-26 HISTORY — DX: Irritable bowel syndrome, unspecified: K58.9

## 2018-03-31 DIAGNOSIS — F4322 Adjustment disorder with anxiety: Secondary | ICD-10-CM | POA: Diagnosis not present

## 2018-03-31 DIAGNOSIS — F902 Attention-deficit hyperactivity disorder, combined type: Secondary | ICD-10-CM | POA: Diagnosis not present

## 2018-03-31 DIAGNOSIS — F4323 Adjustment disorder with mixed anxiety and depressed mood: Secondary | ICD-10-CM | POA: Diagnosis not present

## 2018-03-31 DIAGNOSIS — F5105 Insomnia due to other mental disorder: Secondary | ICD-10-CM | POA: Diagnosis not present

## 2018-04-24 DIAGNOSIS — K5904 Chronic idiopathic constipation: Secondary | ICD-10-CM | POA: Diagnosis not present

## 2018-04-24 DIAGNOSIS — F329 Major depressive disorder, single episode, unspecified: Secondary | ICD-10-CM | POA: Diagnosis not present

## 2018-04-24 DIAGNOSIS — F419 Anxiety disorder, unspecified: Secondary | ICD-10-CM | POA: Diagnosis not present

## 2018-04-24 DIAGNOSIS — F988 Other specified behavioral and emotional disorders with onset usually occurring in childhood and adolescence: Secondary | ICD-10-CM | POA: Diagnosis not present

## 2018-04-24 DIAGNOSIS — Z800 Family history of malignant neoplasm of digestive organs: Secondary | ICD-10-CM | POA: Diagnosis not present

## 2018-04-24 HISTORY — PX: COLONOSCOPY: SHX174

## 2018-04-24 LAB — HM COLONOSCOPY

## 2018-05-02 ENCOUNTER — Encounter: Payer: Self-pay | Admitting: Obstetrics and Gynecology

## 2018-05-02 DIAGNOSIS — F5105 Insomnia due to other mental disorder: Secondary | ICD-10-CM | POA: Diagnosis not present

## 2018-05-02 DIAGNOSIS — F902 Attention-deficit hyperactivity disorder, combined type: Secondary | ICD-10-CM | POA: Diagnosis not present

## 2018-05-02 DIAGNOSIS — F4323 Adjustment disorder with mixed anxiety and depressed mood: Secondary | ICD-10-CM | POA: Diagnosis not present

## 2018-05-03 DIAGNOSIS — F4322 Adjustment disorder with anxiety: Secondary | ICD-10-CM | POA: Diagnosis not present

## 2018-05-10 ENCOUNTER — Ambulatory Visit: Payer: BLUE CROSS/BLUE SHIELD | Admitting: Family Medicine

## 2018-05-18 DIAGNOSIS — F4322 Adjustment disorder with anxiety: Secondary | ICD-10-CM | POA: Diagnosis not present

## 2018-05-31 DIAGNOSIS — F4322 Adjustment disorder with anxiety: Secondary | ICD-10-CM | POA: Diagnosis not present

## 2018-06-13 DIAGNOSIS — J309 Allergic rhinitis, unspecified: Secondary | ICD-10-CM | POA: Diagnosis not present

## 2018-06-13 DIAGNOSIS — J329 Chronic sinusitis, unspecified: Secondary | ICD-10-CM | POA: Diagnosis not present

## 2018-06-15 DIAGNOSIS — F4322 Adjustment disorder with anxiety: Secondary | ICD-10-CM | POA: Diagnosis not present

## 2018-06-27 DIAGNOSIS — D225 Melanocytic nevi of trunk: Secondary | ICD-10-CM | POA: Diagnosis not present

## 2018-06-27 DIAGNOSIS — B354 Tinea corporis: Secondary | ICD-10-CM | POA: Diagnosis not present

## 2018-06-27 DIAGNOSIS — J301 Allergic rhinitis due to pollen: Secondary | ICD-10-CM | POA: Diagnosis not present

## 2018-06-28 DIAGNOSIS — F4322 Adjustment disorder with anxiety: Secondary | ICD-10-CM | POA: Diagnosis not present

## 2018-07-04 DIAGNOSIS — F902 Attention-deficit hyperactivity disorder, combined type: Secondary | ICD-10-CM | POA: Diagnosis not present

## 2018-07-04 DIAGNOSIS — F5105 Insomnia due to other mental disorder: Secondary | ICD-10-CM | POA: Diagnosis not present

## 2018-07-04 DIAGNOSIS — F4323 Adjustment disorder with mixed anxiety and depressed mood: Secondary | ICD-10-CM | POA: Diagnosis not present

## 2018-07-11 DIAGNOSIS — J019 Acute sinusitis, unspecified: Secondary | ICD-10-CM | POA: Diagnosis not present

## 2018-07-11 DIAGNOSIS — J309 Allergic rhinitis, unspecified: Secondary | ICD-10-CM | POA: Diagnosis not present

## 2018-08-02 DIAGNOSIS — F4322 Adjustment disorder with anxiety: Secondary | ICD-10-CM | POA: Diagnosis not present

## 2018-08-09 DIAGNOSIS — F4322 Adjustment disorder with anxiety: Secondary | ICD-10-CM | POA: Diagnosis not present

## 2018-08-18 DIAGNOSIS — J301 Allergic rhinitis due to pollen: Secondary | ICD-10-CM | POA: Diagnosis not present

## 2018-08-21 DIAGNOSIS — J301 Allergic rhinitis due to pollen: Secondary | ICD-10-CM | POA: Diagnosis not present

## 2018-08-24 DIAGNOSIS — J301 Allergic rhinitis due to pollen: Secondary | ICD-10-CM | POA: Diagnosis not present

## 2018-08-28 DIAGNOSIS — J301 Allergic rhinitis due to pollen: Secondary | ICD-10-CM | POA: Diagnosis not present

## 2018-08-30 DIAGNOSIS — R05 Cough: Secondary | ICD-10-CM | POA: Diagnosis not present

## 2018-08-30 DIAGNOSIS — J301 Allergic rhinitis due to pollen: Secondary | ICD-10-CM | POA: Diagnosis not present

## 2018-08-30 DIAGNOSIS — J019 Acute sinusitis, unspecified: Secondary | ICD-10-CM | POA: Diagnosis not present

## 2018-09-14 DIAGNOSIS — J301 Allergic rhinitis due to pollen: Secondary | ICD-10-CM | POA: Diagnosis not present

## 2018-09-18 ENCOUNTER — Encounter: Payer: Self-pay | Admitting: Obstetrics and Gynecology

## 2018-09-18 DIAGNOSIS — J301 Allergic rhinitis due to pollen: Secondary | ICD-10-CM | POA: Diagnosis not present

## 2018-09-19 ENCOUNTER — Encounter: Payer: Self-pay | Admitting: Obstetrics and Gynecology

## 2018-09-19 ENCOUNTER — Ambulatory Visit (INDEPENDENT_AMBULATORY_CARE_PROVIDER_SITE_OTHER): Payer: BLUE CROSS/BLUE SHIELD | Admitting: Obstetrics and Gynecology

## 2018-09-19 VITALS — BP 110/80 | HR 68 | Ht 66.0 in | Wt 138.0 lb

## 2018-09-19 DIAGNOSIS — N644 Mastodynia: Secondary | ICD-10-CM | POA: Diagnosis not present

## 2018-09-19 DIAGNOSIS — R5382 Chronic fatigue, unspecified: Secondary | ICD-10-CM

## 2018-09-19 DIAGNOSIS — L659 Nonscarring hair loss, unspecified: Secondary | ICD-10-CM

## 2018-09-19 DIAGNOSIS — S61213A Laceration without foreign body of left middle finger without damage to nail, initial encounter: Secondary | ICD-10-CM

## 2018-09-19 DIAGNOSIS — Z23 Encounter for immunization: Secondary | ICD-10-CM | POA: Diagnosis not present

## 2018-09-19 NOTE — Progress Notes (Signed)
Jacqueline Sizer, Jacqueline Howell   Chief Complaint  Patient presents with  . Breast Pain    left breast, nipple is super tender per pt, no lumps or discharge x a couple weeks, would like blood work to check thyroid level    HPI:      Jacqueline Howell is a 43 y.o. [id number] who LMP was Patient's last menstrual period was 09/11/2018 (exact date)., presents today for several issues. She has had LT nipple pain for the past few wks. Sx are achy and intermittent, lasting hours. Exercise can cause sx, but can occur randomly as well. No erythema, trauma, nipple d/c. Hx of galactorrhea in past but not recently. Just finishing menses without sx change. No new soap/detergent. Drinks 1 cup caffeine daily. Last mammo 02/22/18. No FH breast/ovar cancer.  Pt also with excessive fatigue for the past 6 months. Gets 8 hrs sleep nightly. No diet/exercise changes. Has also had wt gain and hair is dry/losing hair. FH hair loss. Taking MVI. Has been under increased stress the last few months but not before sx started. Pt interested in labs.   Pt cut LT middle finger recently. Thinks it's infected. Putting hydrogen peroxide on it. Last TdAP 2015.   Past Medical History:  Diagnosis Date  . Depression   . IBS (irritable bowel syndrome) 03/2018  . Vitamin D deficiency 05/2016    Past Surgical History:  Procedure Laterality Date  . ABLATION Right   . COLONOSCOPY  04/24/2018   repeat due in 5 yrs; Dr. Marland Mcalpine    Family History  Problem Relation Age of Onset  . Thyroid disease Mother   . Meniere's disease Father   . Colon cancer Father 67  . Breast cancer Neg Hx     Social History   Socioeconomic History  . Marital status: Married    Spouse name: Not on file  . Number of children: Not on file  . Years of education: Not on file  . Highest education level: Not on file  Occupational History  . Not on file  Social Needs  . Financial resource strain: Not on file  . Food insecurity:    Worry: Not on file   Inability: Not on file  . Transportation needs:    Medical: Not on file    Non-medical: Not on file  Tobacco Use  . Smoking status: Never Smoker  . Smokeless tobacco: Never Used  Substance and Sexual Activity  . Alcohol use: No  . Drug use: No  . Sexual activity: Yes    Birth control/protection: Surgical    Comment: Vasectomy  Lifestyle  . Physical activity:    Days per week: Not on file    Minutes per session: Not on file  . Stress: Not on file  Relationships  . Social connections:    Talks on phone: Not on file    Gets together: Not on file    Attends religious service: Not on file    Active member of club or organization: Not on file    Attends meetings of clubs or organizations: Not on file    Relationship status: Not on file  . Intimate partner violence:    Fear of current or ex partner: Not on file    Emotionally abused: Not on file    Physically abused: Not on file    Forced sexual activity: Not on file  Other Topics Concern  . Not on file  Social History Narrative  . Not  on file    Outpatient Medications Prior to Visit  Medication Sig Dispense Refill  . amphetamine-dextroamphetamine (ADDERALL) 15 MG tablet Take by mouth.    . clonazePAM (KLONOPIN) 0.5 MG tablet Take by mouth.    Marland Kitchen ibuprofen (ADVIL,MOTRIN) 600 MG tablet Take 1 tablet (600 mg total) by mouth every 6 (six) hours as needed. 30 tablet 0  . linaclotide (LINZESS) 145 MCG CAPS capsule Take by mouth.    . traZODone (DESYREL) 50 MG tablet Take 1 tablet (50 mg total) by mouth at bedtime. 90 tablet 3  . venlafaxine XR (EFFEXOR XR) 75 MG 24 hr capsule     . naproxen sodium (ANAPROX DS) 550 MG tablet Take 1 tablet (550 mg total) by mouth 2 (two) times daily with a meal. (Patient not taking: Reported on 09/19/2018) 30 tablet 1  . amphetamine-dextroamphetamine (ADDERALL XR) 10 MG 24 hr capsule Take 1 capsule (10 mg total) by mouth every morning. 28 capsule 0  . amphetamine-dextroamphetamine (ADDERALL) 10 MG  tablet Take 1 tablet (10 mg total) by mouth daily. 28 tablet 0  . clonazePAM (KLONOPIN) 1 MG tablet Take 1 tablet (1 mg total) by mouth daily. 28 tablet 0  . sertraline (ZOLOFT) 50 MG tablet Take 1 tablet (50 mg total) by mouth daily. 90 tablet 2  . zolpidem (AMBIEN) 10 MG tablet Take 1 tablet (10 mg total) by mouth at bedtime as needed for sleep. 10 tablet 0   No facility-administered medications prior to visit.       ROS:  Review of Systems  Constitutional: Positive for fatigue. Negative for fever and unexpected weight change.  Respiratory: Negative for cough, shortness of breath and wheezing.   Cardiovascular: Negative for chest pain, palpitations and leg swelling.  Gastrointestinal: Positive for constipation. Negative for blood in stool, diarrhea, nausea and vomiting.  Endocrine: Negative for cold intolerance, heat intolerance and polyuria.  Genitourinary: Negative for dyspareunia, dysuria, flank pain, frequency, genital sores, hematuria, menstrual problem, pelvic pain, urgency, vaginal bleeding, vaginal discharge and vaginal pain.  Musculoskeletal: Negative for back pain, joint swelling and myalgias.  Skin: Negative for rash.  Neurological: Negative for dizziness, syncope, light-headedness, numbness and headaches.  Hematological: Negative for adenopathy.  Psychiatric/Behavioral: Positive for agitation and dysphoric mood. Negative for confusion, sleep disturbance and suicidal ideas. The patient is not nervous/anxious.    BREAST: No symptoms   OBJECTIVE:   Vitals:  BP 110/80   Pulse 68   Ht 5\' 6"  (1.676 m)   Wt 138 lb (62.6 kg)   LMP 09/11/2018 (Exact Date)   BMI 22.27 kg/m   Physical Exam Vitals signs reviewed.  Neck:     Musculoskeletal: Normal range of motion.  Pulmonary:     Effort: Pulmonary effort is normal.  Chest:     Breasts: Breasts are symmetrical.        Right: No inverted nipple, mass, nipple discharge, skin change or tenderness.        Left: Tenderness  present. No inverted nipple, mass, nipple discharge or skin change.     Comments: LT NIPPLE TENDERNESS WITH PALPATION; NEG EXAM, NO NIPPLE D/C Abdominal:     General: Abdomen is flat.     Palpations: Abdomen is soft.  Musculoskeletal: Normal range of motion.  Skin:    General: Skin is warm and dry.     Findings: Erythema and laceration present. No abscess, bruising or ecchymosis.     Comments: ~1 CM LACERATION ON LT MIDDLE FINGER ABOVE PIP; NO EVID OF  ABSCESS, NO D/C  Neurological:     General: No focal deficit present.     Mental Status: She is alert and oriented to person, place, and time.     Cranial Nerves: No cranial nerve deficit.  Psychiatric:        Mood and Affect: Mood normal.        Behavior: Behavior normal.        Thought Content: Thought content normal.        Judgment: Judgment normal.     Assessment/Plan: Nipple pain - Neg exam except tenderness. Question hormonal. D/C caffeine. F/u if sx persist in 1-2 wks for gen surg ref for 2nd opinion.   Chronic fatigue - Check labs. Will f/u with results.  - Plan: TSH + free T4, Comprehensive metabolic panel, CBC with Differential/Platelet  Hair loss - If neg, could be genetic since FH. - Plan: TSH + free T4, Ferritin, CBC with Differential/Platelet  Laceration of left middle finger without foreign body without damage to nail, initial encounter - No evid of abscess. Apply triple abx oint/cover with bandaid. F/u pnr.   Need for Tdap vaccination - Plan: Tdap vaccine greater than or equal to 7yo IM    Return if symptoms worsen or fail to improve.   B. , PA-C 09/19/2018 11:53 AM

## 2018-09-19 NOTE — Patient Instructions (Signed)
I value your feedback and entrusting us with your care. If you get a  patient survey, I would appreciate you taking the time to let us know about your experience today. Thank you! 

## 2018-09-20 LAB — COMPREHENSIVE METABOLIC PANEL
ALT: 22 IU/L (ref 0–32)
AST: 31 IU/L (ref 0–40)
Albumin/Globulin Ratio: 1.7 (ref 1.2–2.2)
Albumin: 4.3 g/dL (ref 3.8–4.8)
Alkaline Phosphatase: 70 IU/L (ref 39–117)
BUN/Creatinine Ratio: 8 — ABNORMAL LOW (ref 9–23)
BUN: 7 mg/dL (ref 6–24)
Bilirubin Total: 0.2 mg/dL (ref 0.0–1.2)
CO2: 21 mmol/L (ref 20–29)
Calcium: 9.3 mg/dL (ref 8.7–10.2)
Chloride: 101 mmol/L (ref 96–106)
Creatinine, Ser: 0.89 mg/dL (ref 0.57–1.00)
GFR calc Af Amer: 92 mL/min/{1.73_m2} (ref >59)
GFR calc non Af Amer: 80 mL/min/{1.73_m2} (ref >59)
Globulin, Total: 2.6 g/dL (ref 1.5–4.5)
Glucose: 84 mg/dL (ref 65–99)
Potassium: 4.1 mmol/L (ref 3.5–5.2)
Sodium: 139 mmol/L (ref 134–144)
Total Protein: 6.9 g/dL (ref 6.0–8.5)

## 2018-09-20 LAB — CBC WITH DIFFERENTIAL/PLATELET
BASOS ABS: 0.1 10*3/uL (ref 0.0–0.2)
Basos: 1 %
EOS (ABSOLUTE): 0.1 10*3/uL (ref 0.0–0.4)
Eos: 2 %
Hematocrit: 41.4 % (ref 34.0–46.6)
Hemoglobin: 13.9 g/dL (ref 11.1–15.9)
Immature Grans (Abs): 0 10*3/uL (ref 0.0–0.1)
Immature Granulocytes: 0 %
Lymphocytes Absolute: 1.9 10*3/uL (ref 0.7–3.1)
Lymphs: 28 %
MCH: 28.7 pg (ref 26.6–33.0)
MCHC: 33.6 g/dL (ref 31.5–35.7)
MCV: 85 fL (ref 79–97)
Monocytes Absolute: 0.4 10*3/uL (ref 0.1–0.9)
Monocytes: 6 %
NEUTROS PCT: 63 %
Neutrophils Absolute: 4.4 10*3/uL (ref 1.4–7.0)
Platelets: 272 10*3/uL (ref 150–450)
RBC: 4.85 x10E6/uL (ref 3.77–5.28)
RDW: 13.1 % (ref 11.7–15.4)
WBC: 7 10*3/uL (ref 3.4–10.8)

## 2018-09-20 LAB — TSH+FREE T4
Free T4: 0.99 ng/dL (ref 0.82–1.77)
TSH: 2.14 u[IU]/mL (ref 0.450–4.500)

## 2018-09-20 LAB — FERRITIN: Ferritin: 42 ng/mL (ref 15–150)

## 2018-09-20 NOTE — Progress Notes (Signed)
Pls let pt know labs totally normal for fatigue, hair issues. Increase sleep. Hair issues may be stress vs FH. Add hair supplement (biotin, etc). F/u prn.

## 2018-09-20 NOTE — Progress Notes (Signed)
Pt aware.

## 2018-09-21 DIAGNOSIS — J301 Allergic rhinitis due to pollen: Secondary | ICD-10-CM | POA: Diagnosis not present

## 2018-09-25 DIAGNOSIS — J301 Allergic rhinitis due to pollen: Secondary | ICD-10-CM | POA: Diagnosis not present

## 2018-09-28 DIAGNOSIS — F902 Attention-deficit hyperactivity disorder, combined type: Secondary | ICD-10-CM | POA: Diagnosis not present

## 2018-09-28 DIAGNOSIS — F5105 Insomnia due to other mental disorder: Secondary | ICD-10-CM | POA: Diagnosis not present

## 2018-09-28 DIAGNOSIS — F4323 Adjustment disorder with mixed anxiety and depressed mood: Secondary | ICD-10-CM | POA: Diagnosis not present

## 2018-09-28 DIAGNOSIS — J301 Allergic rhinitis due to pollen: Secondary | ICD-10-CM | POA: Diagnosis not present

## 2018-10-02 DIAGNOSIS — J301 Allergic rhinitis due to pollen: Secondary | ICD-10-CM | POA: Diagnosis not present

## 2018-10-05 DIAGNOSIS — J301 Allergic rhinitis due to pollen: Secondary | ICD-10-CM | POA: Diagnosis not present

## 2018-10-09 DIAGNOSIS — J301 Allergic rhinitis due to pollen: Secondary | ICD-10-CM | POA: Diagnosis not present

## 2018-10-12 DIAGNOSIS — F4322 Adjustment disorder with anxiety: Secondary | ICD-10-CM | POA: Diagnosis not present

## 2018-10-19 DIAGNOSIS — F4322 Adjustment disorder with anxiety: Secondary | ICD-10-CM | POA: Diagnosis not present

## 2018-11-01 DIAGNOSIS — F4322 Adjustment disorder with anxiety: Secondary | ICD-10-CM | POA: Diagnosis not present

## 2018-11-02 DIAGNOSIS — F5105 Insomnia due to other mental disorder: Secondary | ICD-10-CM | POA: Diagnosis not present

## 2018-11-02 DIAGNOSIS — F4323 Adjustment disorder with mixed anxiety and depressed mood: Secondary | ICD-10-CM | POA: Diagnosis not present

## 2018-11-02 DIAGNOSIS — F902 Attention-deficit hyperactivity disorder, combined type: Secondary | ICD-10-CM | POA: Diagnosis not present

## 2018-11-07 DIAGNOSIS — F4322 Adjustment disorder with anxiety: Secondary | ICD-10-CM | POA: Diagnosis not present

## 2018-11-16 DIAGNOSIS — F4322 Adjustment disorder with anxiety: Secondary | ICD-10-CM | POA: Diagnosis not present

## 2018-11-29 DIAGNOSIS — F4322 Adjustment disorder with anxiety: Secondary | ICD-10-CM | POA: Diagnosis not present

## 2018-12-06 DIAGNOSIS — F4322 Adjustment disorder with anxiety: Secondary | ICD-10-CM | POA: Diagnosis not present

## 2018-12-14 DIAGNOSIS — Z6822 Body mass index (BMI) 22.0-22.9, adult: Secondary | ICD-10-CM | POA: Diagnosis not present

## 2018-12-14 DIAGNOSIS — M545 Low back pain: Secondary | ICD-10-CM | POA: Diagnosis not present

## 2018-12-15 DIAGNOSIS — F4322 Adjustment disorder with anxiety: Secondary | ICD-10-CM | POA: Diagnosis not present

## 2018-12-21 DIAGNOSIS — F4322 Adjustment disorder with anxiety: Secondary | ICD-10-CM | POA: Diagnosis not present

## 2018-12-28 DIAGNOSIS — F4322 Adjustment disorder with anxiety: Secondary | ICD-10-CM | POA: Diagnosis not present

## 2019-01-05 DIAGNOSIS — F4322 Adjustment disorder with anxiety: Secondary | ICD-10-CM | POA: Diagnosis not present

## 2019-01-10 DIAGNOSIS — F411 Generalized anxiety disorder: Secondary | ICD-10-CM | POA: Diagnosis not present

## 2019-01-10 DIAGNOSIS — G4700 Insomnia, unspecified: Secondary | ICD-10-CM | POA: Diagnosis not present

## 2019-01-10 DIAGNOSIS — F331 Major depressive disorder, recurrent, moderate: Secondary | ICD-10-CM | POA: Diagnosis not present

## 2019-01-10 DIAGNOSIS — F900 Attention-deficit hyperactivity disorder, predominantly inattentive type: Secondary | ICD-10-CM | POA: Diagnosis not present

## 2019-01-17 DIAGNOSIS — J301 Allergic rhinitis due to pollen: Secondary | ICD-10-CM | POA: Diagnosis not present

## 2019-01-18 DIAGNOSIS — F4322 Adjustment disorder with anxiety: Secondary | ICD-10-CM | POA: Diagnosis not present

## 2019-01-29 DIAGNOSIS — J301 Allergic rhinitis due to pollen: Secondary | ICD-10-CM | POA: Diagnosis not present

## 2019-02-01 DIAGNOSIS — J301 Allergic rhinitis due to pollen: Secondary | ICD-10-CM | POA: Diagnosis not present

## 2019-02-02 DIAGNOSIS — F411 Generalized anxiety disorder: Secondary | ICD-10-CM | POA: Diagnosis not present

## 2019-02-02 DIAGNOSIS — F900 Attention-deficit hyperactivity disorder, predominantly inattentive type: Secondary | ICD-10-CM | POA: Diagnosis not present

## 2019-02-02 DIAGNOSIS — G4700 Insomnia, unspecified: Secondary | ICD-10-CM | POA: Diagnosis not present

## 2019-02-02 DIAGNOSIS — F331 Major depressive disorder, recurrent, moderate: Secondary | ICD-10-CM | POA: Diagnosis not present

## 2019-02-05 DIAGNOSIS — J301 Allergic rhinitis due to pollen: Secondary | ICD-10-CM | POA: Diagnosis not present

## 2019-02-07 DIAGNOSIS — J301 Allergic rhinitis due to pollen: Secondary | ICD-10-CM | POA: Diagnosis not present

## 2019-02-08 DIAGNOSIS — F4322 Adjustment disorder with anxiety: Secondary | ICD-10-CM | POA: Diagnosis not present

## 2019-02-08 DIAGNOSIS — J301 Allergic rhinitis due to pollen: Secondary | ICD-10-CM | POA: Diagnosis not present

## 2019-02-12 DIAGNOSIS — J301 Allergic rhinitis due to pollen: Secondary | ICD-10-CM | POA: Diagnosis not present

## 2019-02-15 DIAGNOSIS — J301 Allergic rhinitis due to pollen: Secondary | ICD-10-CM | POA: Diagnosis not present

## 2019-02-16 DIAGNOSIS — F4322 Adjustment disorder with anxiety: Secondary | ICD-10-CM | POA: Diagnosis not present

## 2019-02-22 DIAGNOSIS — J301 Allergic rhinitis due to pollen: Secondary | ICD-10-CM | POA: Diagnosis not present

## 2019-02-26 DIAGNOSIS — J301 Allergic rhinitis due to pollen: Secondary | ICD-10-CM | POA: Diagnosis not present

## 2019-03-02 DIAGNOSIS — F4322 Adjustment disorder with anxiety: Secondary | ICD-10-CM | POA: Diagnosis not present

## 2019-03-05 DIAGNOSIS — J301 Allergic rhinitis due to pollen: Secondary | ICD-10-CM | POA: Diagnosis not present

## 2019-03-06 DIAGNOSIS — J301 Allergic rhinitis due to pollen: Secondary | ICD-10-CM | POA: Diagnosis not present

## 2019-03-08 DIAGNOSIS — J301 Allergic rhinitis due to pollen: Secondary | ICD-10-CM | POA: Diagnosis not present

## 2019-03-13 DIAGNOSIS — G4700 Insomnia, unspecified: Secondary | ICD-10-CM | POA: Diagnosis not present

## 2019-03-13 DIAGNOSIS — F900 Attention-deficit hyperactivity disorder, predominantly inattentive type: Secondary | ICD-10-CM | POA: Diagnosis not present

## 2019-03-13 DIAGNOSIS — F331 Major depressive disorder, recurrent, moderate: Secondary | ICD-10-CM | POA: Diagnosis not present

## 2019-03-13 DIAGNOSIS — F411 Generalized anxiety disorder: Secondary | ICD-10-CM | POA: Diagnosis not present

## 2019-03-14 DIAGNOSIS — F4322 Adjustment disorder with anxiety: Secondary | ICD-10-CM | POA: Diagnosis not present

## 2019-03-19 DIAGNOSIS — J301 Allergic rhinitis due to pollen: Secondary | ICD-10-CM | POA: Diagnosis not present

## 2019-03-21 ENCOUNTER — Encounter: Payer: Self-pay | Admitting: Obstetrics & Gynecology

## 2019-03-22 DIAGNOSIS — J301 Allergic rhinitis due to pollen: Secondary | ICD-10-CM | POA: Diagnosis not present

## 2019-03-26 ENCOUNTER — Other Ambulatory Visit
Admission: RE | Admit: 2019-03-26 | Discharge: 2019-03-26 | Disposition: A | Discharge status: Home / Self Care | Payer: BC Managed Care – PPO | Source: Ambulatory Visit | Attending: Obstetrics & Gynecology | Admitting: Obstetrics & Gynecology

## 2019-03-26 ENCOUNTER — Encounter: Payer: Self-pay | Admitting: Obstetrics & Gynecology

## 2019-03-26 ENCOUNTER — Telehealth: Payer: Self-pay | Admitting: Obstetrics & Gynecology

## 2019-03-26 ENCOUNTER — Ambulatory Visit (INDEPENDENT_AMBULATORY_CARE_PROVIDER_SITE_OTHER): Payer: BC Managed Care – PPO | Admitting: Obstetrics & Gynecology

## 2019-03-26 ENCOUNTER — Other Ambulatory Visit: Payer: Self-pay

## 2019-03-26 VITALS — BP 100/60 | Ht 66.0 in | Wt 137.0 lb

## 2019-03-26 DIAGNOSIS — Z01812 Encounter for preprocedural laboratory examination: Secondary | ICD-10-CM | POA: Insufficient documentation

## 2019-03-26 DIAGNOSIS — J301 Allergic rhinitis due to pollen: Secondary | ICD-10-CM | POA: Diagnosis not present

## 2019-03-26 DIAGNOSIS — N3946 Mixed incontinence: Secondary | ICD-10-CM

## 2019-03-26 DIAGNOSIS — Z20828 Contact with and (suspected) exposure to other viral communicable diseases: Secondary | ICD-10-CM | POA: Insufficient documentation

## 2019-03-26 LAB — SARS CORONAVIRUS 2 (TAT 6-24 HRS): SARS Coronavirus 2: NEGATIVE

## 2019-03-26 NOTE — H&P (View-Only) (Signed)
   PRE-OPERATIVE HISTORY AND PHYSICAL EXAM  HPI:  Jacqueline Howell is a 43 y.o. [id number] Patient's last menstrual period was 03/17/2019.; she is seen today for urinary concerns, and is being planned and admitted for surgery related to urinary incontinence.  Pt has been having leakage of urine concerns for a while now, s/p NSVD x4 (last one 5 years ago), and is not planning anymore pregnancies (husband has vasectomy).  Denies freq, urge, nocturia.  Wears pad continuously and notes leakage w any activity.  Periods regular, 3 days, not a concern.  PMHx: Past Medical History:  Diagnosis Date  . Depression   . IBS (irritable bowel syndrome) 03/2018  . Vitamin D deficiency 05/2016   Past Surgical History:  Procedure Laterality Date  . ABLATION Right   . COLONOSCOPY  04/24/2018   repeat due in 5 yrs; Dr. Sheikh   Family History  Problem Relation Age of Onset  . Thyroid disease Mother   . Meniere's disease Father   . Colon cancer Father 45  . Breast cancer Neg Hx    Social History   Tobacco Use  . Smoking status: Never Smoker  . Smokeless tobacco: Never Used  Substance Use Topics  . Alcohol use: No  . Drug use: No    Current Outpatient Medications:  .  amphetamine-dextroamphetamine (ADDERALL) 15 MG tablet, Take by mouth., Disp: , Rfl:  .  linaclotide (LINZESS) 145 MCG CAPS capsule, Take by mouth., Disp: , Rfl:  .  LORazepam (ATIVAN) 1 MG tablet, , Disp: , Rfl:  .  traZODone (DESYREL) 50 MG tablet, Take 1 tablet (50 mg total) by mouth at bedtime., Disp: 90 tablet, Rfl: 3 .  clonazePAM (KLONOPIN) 0.5 MG tablet, Take by mouth., Disp: , Rfl:  .  ibuprofen (ADVIL,MOTRIN) 600 MG tablet, Take 1 tablet (600 mg total) by mouth every 6 (six) hours as needed., Disp: 30 tablet, Rfl: 0 .  naproxen sodium (ANAPROX DS) 550 MG tablet, Take 1 tablet (550 mg total) by mouth 2 (two) times daily with a meal. (Patient not taking: Reported on 09/19/2018), Disp: 30 tablet, Rfl: 1 .  venlafaxine XR (EFFEXOR  XR) 75 MG 24 hr capsule, , Disp: , Rfl:  Allergies: Biaxin [clarithromycin]  Review of Systems  Constitutional: Negative for chills, fever and malaise/fatigue.  HENT: Negative for congestion, sinus pain and sore throat.   Eyes: Negative for blurred vision and pain.  Respiratory: Negative for cough and wheezing.   Cardiovascular: Negative for chest pain and leg swelling.  Gastrointestinal: Negative for abdominal pain, constipation, diarrhea, heartburn, nausea and vomiting.  Genitourinary: Negative for dysuria, frequency, hematuria and urgency.  Musculoskeletal: Negative for back pain, joint pain, myalgias and neck pain.  Skin: Negative for itching and rash.  Neurological: Negative for dizziness, tremors and weakness.  Endo/Heme/Allergies: Does not bruise/bleed easily.  Psychiatric/Behavioral: Negative for depression. The patient is not nervous/anxious and does not have insomnia.     Objective: BP 100/60   Ht 5' 6" (1.676 m)   Wt 137 lb (62.1 kg)   LMP 03/17/2019   BMI 22.11 kg/m   Filed Weights   03/26/19 0844  Weight: 137 lb (62.1 kg)   Physical Exam Constitutional:      General: She is not in acute distress.    Appearance: She is well-developed.  Genitourinary:     Pelvic exam was performed with patient supine.     Vagina, uterus and rectum normal.     No lesions in the vagina.       No vaginal bleeding.     No cervical motion tenderness, friability, lesion or polyp.     Uterus is mobile.     Uterus is not enlarged.     No uterine mass detected.    Uterus is midaxial.     No right or left adnexal mass present.     Right adnexa not tender.     Left adnexa not tender.     Genitourinary Comments: Uterus- no descensus Cystocele gr 1 No rectocele Mild urethral mobility w valsalva  HENT:     Head: Normocephalic and atraumatic. No laceration.     Right Ear: Hearing normal.     Left Ear: Hearing normal.     Mouth/Throat:     Pharynx: Uvula midline.  Eyes:     Pupils:  Pupils are equal, round, and reactive to light.  Neck:     Musculoskeletal: Normal range of motion and neck supple.     Thyroid: No thyromegaly.  Cardiovascular:     Rate and Rhythm: Normal rate and regular rhythm.     Heart sounds: No murmur. No friction rub. No gallop.   Pulmonary:     Effort: Pulmonary effort is normal. No respiratory distress.     Breath sounds: Normal breath sounds. No wheezing.  Chest:     Breasts:        Right: No mass, skin change or tenderness.        Left: No mass, skin change or tenderness.  Abdominal:     General: Bowel sounds are normal. There is no distension.     Palpations: Abdomen is soft.     Tenderness: There is no abdominal tenderness. There is no rebound.  Musculoskeletal: Normal range of motion.  Neurological:     Mental Status: She is alert and oriented to person, place, and time.     Cranial Nerves: No cranial nerve deficit.  Skin:    General: Skin is warm and dry.  Psychiatric:        Judgment: Judgment normal.  Vitals signs reviewed.     Assessment: 1. Mixed stress and urge urinary incontinence   Plan Anterior repair w sling Pros and cons of surgery for incontinence discussed, as well as its likelihood for recurrence with time, activity, and based on genetic predisposition.  Tx for nerve based incontinence or sx's discussed as well (overactive bladder).  Recovery and activity restrictions discussed.  Possible urinary retention and need for catheter use counseled. Uterus is stable and not showing prolapse at this time and thus not in need of a hysterectomy.  I have had a careful discussion with this patient about all the options available and the risk/benefits of each. I have fully informed this patient that surgery may subject her to a variety of discomforts and risks: She understands that most patients have surgery with little difficulty, but problems can happen ranging from minor to fatal. These include nausea, vomiting, pain, bleeding,  infection, poor healing, hernia, or formation of adhesions. Unexpected reactions may occur from any drug or anesthetic given. Unintended injury may occur to other pelvic or abdominal structures such as Fallopian tubes, ovaries, bladder, ureter (tube from kidney to bladder), or bowel. Nerves going from the pelvis to the legs may be injured. Any such injury may require immediate or later additional surgery to correct the problem. Excessive blood loss requiring transfusion is very unlikely but possible. Dangerous blood clots may form in the legs or lungs. Physical and sexual activity will be restricted  in varying degrees for an indeterminate period of time but most often 2-6 weeks.  Finally, she understands that it is impossible to list every possible undesirable effect and that the condition for which surgery is done is not always cured or significantly improved, and in rare cases may be even worse.Ample time was given to answer all questions.  Annamarie MajorPaul Kelcy Baeten, MD, Merlinda FrederickFACOG [facility], [facility] 03/26/2019  9:33 AM

## 2019-03-26 NOTE — Telephone Encounter (Signed)
Patient is aware of Pre-admit testing phone interview to be scheduled (patient will watch for notification in [place]), and OR on 03/29/19. Patient confirmed she had covid testing today. Patient is aware she may receive calls from the [place] and [facility]. Patient is aware per [facility] e, her policy ends today. Patient believes the policy ends 07/31/08,RUE will have her husband call now. Ext given.

## 2019-03-26 NOTE — Progress Notes (Signed)
PRE-OPERATIVE HISTORY AND PHYSICAL EXAM  HPI:  Jacqueline Howell is a 43 y.o. [id number] Patient's last menstrual period was 03/17/2019.; she is seen today for urinary concerns, and is being planned and admitted for surgery related to urinary incontinence.  Pt has been having leakage of urine concerns for a while now, s/p NSVD x4 (last one 5 years ago), and is not planning anymore pregnancies (husband has vasectomy).  Denies freq, urge, nocturia.  Wears pad continuously and notes leakage w any activity.  Periods regular, 3 days, not a concern.  PMHx: Past Medical History:  Diagnosis Date  . Depression   . IBS (irritable bowel syndrome) 03/2018  . Vitamin D deficiency 05/2016   Past Surgical History:  Procedure Laterality Date  . ABLATION Right   . COLONOSCOPY  04/24/2018   repeat due in 5 yrs; Dr. Marland Mcalpine   Family History  Problem Relation Age of Onset  . Thyroid disease Mother   . Meniere's disease Father   . Colon cancer Father 26  . Breast cancer Neg Hx    Social History   Tobacco Use  . Smoking status: Never Smoker  . Smokeless tobacco: Never Used  Substance Use Topics  . Alcohol use: No  . Drug use: No    Current Outpatient Medications:  .  amphetamine-dextroamphetamine (ADDERALL) 15 MG tablet, Take by mouth., Disp: , Rfl:  .  linaclotide (LINZESS) 145 MCG CAPS capsule, Take by mouth., Disp: , Rfl:  .  LORazepam (ATIVAN) 1 MG tablet, , Disp: , Rfl:  .  traZODone (DESYREL) 50 MG tablet, Take 1 tablet (50 mg total) by mouth at bedtime., Disp: 90 tablet, Rfl: 3 .  clonazePAM (KLONOPIN) 0.5 MG tablet, Take by mouth., Disp: , Rfl:  .  ibuprofen (ADVIL,MOTRIN) 600 MG tablet, Take 1 tablet (600 mg total) by mouth every 6 (six) hours as needed., Disp: 30 tablet, Rfl: 0 .  naproxen sodium (ANAPROX DS) 550 MG tablet, Take 1 tablet (550 mg total) by mouth 2 (two) times daily with a meal. (Patient not taking: Reported on 09/19/2018), Disp: 30 tablet, Rfl: 1 .  venlafaxine XR (EFFEXOR  XR) 75 MG 24 hr capsule, , Disp: , Rfl:  Allergies: Biaxin [clarithromycin]  Review of Systems  Constitutional: Negative for chills, fever and malaise/fatigue.  HENT: Negative for congestion, sinus pain and sore throat.   Eyes: Negative for blurred vision and pain.  Respiratory: Negative for cough and wheezing.   Cardiovascular: Negative for chest pain and leg swelling.  Gastrointestinal: Negative for abdominal pain, constipation, diarrhea, heartburn, nausea and vomiting.  Genitourinary: Negative for dysuria, frequency, hematuria and urgency.  Musculoskeletal: Negative for back pain, joint pain, myalgias and neck pain.  Skin: Negative for itching and rash.  Neurological: Negative for dizziness, tremors and weakness.  Endo/Heme/Allergies: Does not bruise/bleed easily.  Psychiatric/Behavioral: Negative for depression. The patient is not nervous/anxious and does not have insomnia.     Objective: BP 100/60   Ht 5\' 6"  (1.676 m)   Wt 137 lb (62.1 kg)   LMP 03/17/2019   BMI 22.11 kg/m   Filed Weights   03/26/19 0844  Weight: 137 lb (62.1 kg)   Physical Exam Constitutional:      General: She is not in acute distress.    Appearance: She is well-developed.  Genitourinary:     Pelvic exam was performed with patient supine.     Vagina, uterus and rectum normal.     No lesions in the vagina.  No vaginal bleeding.     No cervical motion tenderness, friability, lesion or polyp.     Uterus is mobile.     Uterus is not enlarged.     No uterine mass detected.    Uterus is midaxial.     No right or left adnexal mass present.     Right adnexa not tender.     Left adnexa not tender.     Genitourinary Comments: Uterus- no descensus Cystocele gr 1 No rectocele Mild urethral mobility w valsalva  HENT:     Head: Normocephalic and atraumatic. No laceration.     Right Ear: Hearing normal.     Left Ear: Hearing normal.     Mouth/Throat:     Pharynx: Uvula midline.  Eyes:     Pupils:  Pupils are equal, round, and reactive to light.  Neck:     Musculoskeletal: Normal range of motion and neck supple.     Thyroid: No thyromegaly.  Cardiovascular:     Rate and Rhythm: Normal rate and regular rhythm.     Heart sounds: No murmur. No friction rub. No gallop.   Pulmonary:     Effort: Pulmonary effort is normal. No respiratory distress.     Breath sounds: Normal breath sounds. No wheezing.  Chest:     Breasts:        Right: No mass, skin change or tenderness.        Left: No mass, skin change or tenderness.  Abdominal:     General: Bowel sounds are normal. There is no distension.     Palpations: Abdomen is soft.     Tenderness: There is no abdominal tenderness. There is no rebound.  Musculoskeletal: Normal range of motion.  Neurological:     Mental Status: She is alert and oriented to person, place, and time.     Cranial Nerves: No cranial nerve deficit.  Skin:    General: Skin is warm and dry.  Psychiatric:        Judgment: Judgment normal.  Vitals signs reviewed.     Assessment: 1. Mixed stress and urge urinary incontinence   Plan Anterior repair w sling Pros and cons of surgery for incontinence discussed, as well as its likelihood for recurrence with time, activity, and based on genetic predisposition.  Tx for nerve based incontinence or sx's discussed as well (overactive bladder).  Recovery and activity restrictions discussed.  Possible urinary retention and need for catheter use counseled. Uterus is stable and not showing prolapse at this time and thus not in need of a hysterectomy.  I have had a careful discussion with this patient about all the options available and the risk/benefits of each. I have fully informed this patient that surgery may subject her to a variety of discomforts and risks: She understands that most patients have surgery with little difficulty, but problems can happen ranging from minor to fatal. These include nausea, vomiting, pain, bleeding,  infection, poor healing, hernia, or formation of adhesions. Unexpected reactions may occur from any drug or anesthetic given. Unintended injury may occur to other pelvic or abdominal structures such as Fallopian tubes, ovaries, bladder, ureter (tube from kidney to bladder), or bowel. Nerves going from the pelvis to the legs may be injured. Any such injury may require immediate or later additional surgery to correct the problem. Excessive blood loss requiring transfusion is very unlikely but possible. Dangerous blood clots may form in the legs or lungs. Physical and sexual activity will be restricted  in varying degrees for an indeterminate period of time but most often 2-6 weeks.  Finally, she understands that it is impossible to list every possible undesirable effect and that the condition for which surgery is done is not always cured or significantly improved, and in rare cases may be even worse.Ample time was given to answer all questions.  Annamarie MajorPaul Maya Arcand, MD, Merlinda FrederickFACOG [facility], [facility] 03/26/2019  9:33 AM

## 2019-03-26 NOTE — Telephone Encounter (Signed)
-----   Message from Gae Dry, MD sent at 03/26/2019  9:07 AM EDT ----- Regarding: [place] Request Patient Full Name:  Jacqueline Howell  MRN: [id number]  DOB: Jun 03, 1976  Surgeon: Hoyt Koch, MD  Requested Surgery Date and Time: 03/29/19 Primary Diagnosis AND Code: Mixed urinary Incontinence Secondary Diagnosis and Code: N39.46 Surgical Procedure: Anterior repair with Pubovaginal sling, Cystoscopy L&D Notification: No Admission Status: same day surgery Length of Surgery: 45 min Special Case Needs: TVT EXACT H&P: today (date) Phone Interview???: yes Interpreter: Language:  Medical Clearance: no Special Scheduling Instructions: no Acuity: P3

## 2019-03-26 NOTE — Patient Instructions (Signed)
PRE ADMISSION TESTING For Covid, prior to procedure Monday 9:00-10:00 [facility] entrance (drive up)  Results in [id number] hours You will not receive notification if test results are negative. If positive for Covid19, your provider will notify you by phone, with additional instructions.   Anterior and Posterior Colporrhaphy and Sling Procedure, Care After This sheet gives you information about how to care for yourself after your procedure. Your health care provider may also give you more specific instructions. If you have problems or questions, contact your health care provider. What can I expect after the procedure? After the procedure, it is common to have:  Pain in the surgical area.  Vaginal discharge. You will need to use a sanitary pad during this time.  Fatigue. Follow these instructions at home: Incision care   Follow instructions from your health care provider about how to take care of your incision. Make sure you: ? Wash your hands with soap and water before touching the incision area. If soap and water are not available, use hand sanitizer. ? Clean your incision as told by your health care provider. ? Leave stitches (sutures), skin glue, or adhesive strips in place. These skin closures may need to stay in place for 2 weeks or longer. If adhesive strip edges start to loosen and curl up, you may trim the loose edges. Do not remove adhesive strips completely unless your health care provider tells you to do that.  Check your incision area every day for signs of infection. Check for: ? Redness, swelling, or pain. ? Fluid or blood. ? Warmth. ? Pus or a bad smell.  Check your incision every day to make sure the incision area is not separating or opening.  Do not take baths, swim, or use a hot tub until your health care provider approves. You may shower.  Keep the area between your vagina and rectum (perineal area) clean and dry. Make sure you clean the area after  each bowel movement and each time you urinate.  Ask your health care provider if you can take a sitz bath or sit in a tub of clean, warm water. Activity  Do gentle, daily activity as told by your health care provider. You may be told to take short walks every day and go farther each time. Ask your health care provider what activities are safe for you.  Limit stair climbing to once or twice a day in the first week, then slowly increase this activity.  Do not lift anything that is heavier than 10 lbs. (4.5 kg), or the limit that your health care provider tells you, until he or she says that it is safe. Avoid pushing or pulling motions.  Avoid standing for long periods of time.  Do not douche, use tampons, or have sex until your health care provider says it is okay.  Do not drive or use heavy machinery while taking prescription pain medicine. To prevent constipation  To prevent or treat constipation while you are taking prescription pain medicine, your health care provider may recommend that you: ? Take over-the-counter or prescription medicines. ? Eat foods that are high in fiber, such as fresh fruits and vegetables, whole grains, and beans. ? Drink enough fluid to keep your urine clear or pale yellow. ? Limit foods that are high in fat and processed sugars, such as fried and sweet foods. General instructions  You may be instructed to do pelvic floor exercises (kegels) as told by your health care provider.  Take over-the-counter  and prescription medicines only as told by your health care provider.  Keep all follow-up visits as told by your health care provider. This is important. Contact a health care provider if:  Medicine does not help your pain.  You have frequent or urgent urination, or you are unable to completely empty your bladder.  You feel a burning sensation when urinating.  You have fluid or blood coming from your incision.  You have pus or a bad smell coming from the  incision.  Your incision feels warm to the touch.  You have redness, swelling, or pain around your incision. Get help right away if:  You have a fever or chills.  Your incision separates or opens.  You cannot urinate.  You have trouble breathing. Summary  After the procedure, it is common to have pain, fatigue, and discharge from the vagina.  Keep the area between your vagina and rectum (perineal area) clean and dry. Make sure you clean the area after each bowel movement and each time you urinate.  Follow instructions from your health care provider on any activity restrictions after the procedure. This information is not intended to replace advice given to you by your health care provider. Make sure you discuss any questions you have with your health care provider. Document Released: 02/24/2004 Document Revised: 06/24/2017 Document Reviewed: 07/12/2016 Elsevier Patient Education  2020 [employer].

## 2019-03-28 ENCOUNTER — Other Ambulatory Visit: Payer: Self-pay

## 2019-03-28 ENCOUNTER — Encounter
Admission: RE | Admit: 2019-03-28 | Discharge: 2019-03-28 | Disposition: A | Discharge status: Home / Self Care | Payer: BC Managed Care – PPO | Source: Ambulatory Visit | Attending: Obstetrics & Gynecology | Admitting: Obstetrics & Gynecology

## 2019-03-28 NOTE — Patient Instructions (Signed)
Your procedure is scheduled on: Thursday 03/29/19  Report to [place]. To find out your arrival time please call [phone] between [place].   Remember: Instructions that are not followed completely may result in serious medical risk, up to and including death, or upon the discretion of your surgeon and anesthesiologist your surgery may need to be rescheduled.      _X__ 1. Do not eat food after midnight the night before your procedure.                 No gum chewing or hard candies. You may drink clear liquids up to 2 hours                 before you are scheduled to arrive for your surgery- DO NOT drink clear                 liquids within 2 hours of the start of your surgery.                 Clear Liquids include:  water, apple juice without pulp, clear carbohydrate                 drink such as Clearfast or Gatorade, Black Coffee or Tea (Do not add                 anything to coffee or tea).  __X__2.  On the morning of surgery brush your teeth with toothpaste and water, you may rinse your mouth with mouthwash if you wish.  Do not swallow any toothpaste or mouthwash.     __X__3.  Notify your doctor if there is any change in your medical condition      (cold, fever, infections).      Do not wear jewelry, make-up, hairpins, clips or nail polish. Do not wear lotions, powders, or perfumes.  Do not shave 48 hours prior to surgery. Men may shave face and neck. Do not bring valuables to the hospital.     [facility] is not responsible for any belongings or valuables.    Contacts, dentures/partials or body piercings may not be worn into surgery. Bring a case for your contacts, glasses or hearing aids, a denture cup will be supplied.     Patients discharged the day of surgery will not be allowed to drive home.   __X__ Take these medicines the morning of surgery with A SIP OF WATER:     1. cetirizine (ZYRTEC) 10 MG  tablet  2. sertraline (ZOLOFT) 100 MG tablet  3. LORazepam (ATIVAN) 1 MG tablet if needed  4. VENTOLIN HFA 108 (90 Base) MCG/ACT inhaler       __X__ Use CHG Soap/SAGE wipes as directed    __ X__ Use inhalers on the day of surgery. Also bring the inhaler with you to the hospital on the morning of surgery.    __X__ Stop Anti-inflammatories 7 days before surgery such as Advil, Ibuprofen, Motrin, BC or Goodies Powder, Naprosyn, Naproxen, Aleve, Aspirin, Meloxicam. May take Tylenol if needed for pain or discomfort.     __X__ Please don't begin taking any new herbal supplements prior to your procedure.

## 2019-03-29 ENCOUNTER — Encounter: Payer: Self-pay | Admitting: *Deleted

## 2019-03-29 ENCOUNTER — Ambulatory Visit
Admission: RE | Admit: 2019-03-29 | Discharge: 2019-03-29 | Disposition: A | Discharge status: Home / Self Care | Payer: BC Managed Care – PPO | Attending: Obstetrics & Gynecology | Admitting: Obstetrics & Gynecology

## 2019-03-29 ENCOUNTER — Ambulatory Visit: Payer: BC Managed Care – PPO | Admitting: Certified Registered Nurse Anesthetist

## 2019-03-29 ENCOUNTER — Encounter
Admission: RE | Disposition: A | Discharge status: Home / Self Care | Payer: Self-pay | Source: Home / Self Care | Attending: Obstetrics & Gynecology

## 2019-03-29 DIAGNOSIS — N3946 Mixed incontinence: Secondary | ICD-10-CM | POA: Diagnosis not present

## 2019-03-29 DIAGNOSIS — Z791 Long term (current) use of non-steroidal anti-inflammatories (NSAID): Secondary | ICD-10-CM | POA: Insufficient documentation

## 2019-03-29 DIAGNOSIS — R51 Headache: Secondary | ICD-10-CM | POA: Diagnosis not present

## 2019-03-29 DIAGNOSIS — F419 Anxiety disorder, unspecified: Secondary | ICD-10-CM | POA: Insufficient documentation

## 2019-03-29 DIAGNOSIS — R32 Unspecified urinary incontinence: Secondary | ICD-10-CM | POA: Diagnosis present

## 2019-03-29 DIAGNOSIS — K589 Irritable bowel syndrome without diarrhea: Secondary | ICD-10-CM | POA: Diagnosis not present

## 2019-03-29 DIAGNOSIS — N8111 Cystocele, midline: Secondary | ICD-10-CM | POA: Diagnosis not present

## 2019-03-29 DIAGNOSIS — Z79899 Other long term (current) drug therapy: Secondary | ICD-10-CM | POA: Insufficient documentation

## 2019-03-29 DIAGNOSIS — Z820 Family history of epilepsy and other diseases of the nervous system: Secondary | ICD-10-CM | POA: Diagnosis not present

## 2019-03-29 DIAGNOSIS — Z800 Family history of malignant neoplasm of digestive organs: Secondary | ICD-10-CM | POA: Insufficient documentation

## 2019-03-29 DIAGNOSIS — Z8349 Family history of other endocrine, nutritional and metabolic diseases: Secondary | ICD-10-CM | POA: Diagnosis not present

## 2019-03-29 DIAGNOSIS — E559 Vitamin D deficiency, unspecified: Secondary | ICD-10-CM | POA: Insufficient documentation

## 2019-03-29 DIAGNOSIS — F329 Major depressive disorder, single episode, unspecified: Secondary | ICD-10-CM | POA: Diagnosis not present

## 2019-03-29 DIAGNOSIS — N8110 Cystocele, unspecified: Secondary | ICD-10-CM | POA: Diagnosis not present

## 2019-03-29 HISTORY — PX: BLADDER SUSPENSION: SHX72

## 2019-03-29 HISTORY — PX: CYSTOSCOPY: SHX5120

## 2019-03-29 HISTORY — PX: CYSTOCELE REPAIR: SHX163

## 2019-03-29 LAB — CBC
HCT: 37.8 % (ref 36.0–46.0)
Hemoglobin: 12.2 g/dL (ref 12.0–15.0)
MCH: 27.7 pg (ref 26.0–34.0)
MCHC: 32.3 g/dL (ref 30.0–36.0)
MCV: 85.7 fL (ref 80.0–100.0)
Platelets: 239 10*3/uL (ref 150–400)
RBC: 4.41 MIL/uL (ref 3.87–5.11)
RDW: 13.3 % (ref 11.5–15.5)
WBC: 5.7 10*3/uL (ref 4.0–10.5)
nRBC: 0 % (ref 0.0–0.2)

## 2019-03-29 LAB — ABO/RH: ABO/RH(D): A NEG

## 2019-03-29 LAB — TYPE AND SCREEN
ABO/RH(D): A NEG
Antibody Screen: NEGATIVE

## 2019-03-29 LAB — POCT PREGNANCY, URINE: Preg Test, Ur: NEGATIVE

## 2019-03-29 SURGERY — COLPORRHAPHY, ANTERIOR, FOR CYSTOCELE REPAIR
Anesthesia: General

## 2019-03-29 MED ORDER — HYDROCODONE-ACETAMINOPHEN 7.5-325 MG PO TABS
ORAL_TABLET | ORAL | Status: AC
  Filled 2019-03-29: qty 1

## 2019-03-29 MED ORDER — ACETAMINOPHEN NICU IV SYRINGE 10 MG/ML
INTRAVENOUS | Status: AC
  Filled 2019-03-29: qty 1

## 2019-03-29 MED ORDER — KETOROLAC TROMETHAMINE 30 MG/ML IJ SOLN
INTRAMUSCULAR | Status: AC
  Filled 2019-03-29: qty 1

## 2019-03-29 MED ORDER — LACTATED RINGERS IV SOLN: INTRAVENOUS | Status: DC

## 2019-03-29 MED ORDER — MORPHINE SULFATE (PF) 4 MG/ML IV SOLN: 1.0000 mg | INTRAVENOUS | Status: DC | PRN

## 2019-03-29 MED ORDER — FENTANYL CITRATE (PF) 100 MCG/2ML IJ SOLN
INTRAMUSCULAR | Status: AC
  Filled 2019-03-29: qty 2

## 2019-03-29 MED ORDER — KETOROLAC TROMETHAMINE 30 MG/ML IJ SOLN
INTRAMUSCULAR | Status: DC | PRN
  Administered 2019-03-29: 15 mg via INTRAVENOUS

## 2019-03-29 MED ORDER — LACTATED RINGERS IV SOLN
INTRAVENOUS | Status: DC
  Administered 2019-03-29: 11:00:00 via INTRAVENOUS

## 2019-03-29 MED ORDER — CEFAZOLIN SODIUM-DEXTROSE 1-4 GM/50ML-% IV SOLN
1.0000 g | Freq: Once | INTRAVENOUS | Status: AC
  Administered 2019-03-29: 1 g via INTRAVENOUS

## 2019-03-29 MED ORDER — MIDAZOLAM HCL 2 MG/2ML IJ SOLN
INTRAMUSCULAR | Status: AC
  Filled 2019-03-29: qty 2

## 2019-03-29 MED ORDER — ACETAMINOPHEN 325 MG PO TABS: 325.0000 mg | ORAL_TABLET | ORAL | Status: DC | PRN

## 2019-03-29 MED ORDER — OXYCODONE-ACETAMINOPHEN 5-325 MG PO TABS: 1.0000 | ORAL_TABLET | ORAL | 0 refills | Status: DC | PRN

## 2019-03-29 MED ORDER — ACETAMINOPHEN 10 MG/ML IV SOLN
INTRAVENOUS | Status: DC | PRN
  Administered 2019-03-29: 1000 mg via INTRAVENOUS

## 2019-03-29 MED ORDER — ACETAMINOPHEN 160 MG/5ML PO SOLN
325.0000 mg | ORAL | Status: DC | PRN
  Filled 2019-03-29: qty 20.3

## 2019-03-29 MED ORDER — LIDOCAINE-EPINEPHRINE 1 %-1:100000 IJ SOLN
INTRAMUSCULAR | Status: DC | PRN
  Administered 2019-03-29: 21 mL

## 2019-03-29 MED ORDER — ESTROGENS, CONJUGATED 0.625 MG/GM VA CREA
TOPICAL_CREAM | VAGINAL | Status: DC | PRN
  Administered 2019-03-29: 1 via VAGINAL

## 2019-03-29 MED ORDER — PROPOFOL 10 MG/ML IV BOLUS
INTRAVENOUS | Status: DC | PRN
  Administered 2019-03-29: 120 mg via INTRAVENOUS
  Administered 2019-03-29: 80 mg via INTRAVENOUS

## 2019-03-29 MED ORDER — ONDANSETRON HCL 4 MG/2ML IJ SOLN
INTRAMUSCULAR | Status: DC | PRN
  Administered 2019-03-29: 4 mg via INTRAVENOUS

## 2019-03-29 MED ORDER — ESTROGENS, CONJUGATED 0.625 MG/GM VA CREA
TOPICAL_CREAM | VAGINAL | Status: AC
  Filled 2019-03-29: qty 30

## 2019-03-29 MED ORDER — PROMETHAZINE HCL 25 MG/ML IJ SOLN: 6.2500 mg | INTRAMUSCULAR | Status: DC | PRN

## 2019-03-29 MED ORDER — FAMOTIDINE 20 MG PO TABS
ORAL_TABLET | ORAL | Status: AC
  Filled 2019-03-29: qty 1

## 2019-03-29 MED ORDER — KETOROLAC TROMETHAMINE 30 MG/ML IJ SOLN
30.0000 mg | Freq: Four times a day (QID) | INTRAMUSCULAR | Status: DC
  Administered 2019-03-29: 30 mg via INTRAVENOUS

## 2019-03-29 MED ORDER — CEFAZOLIN SODIUM-DEXTROSE 1-4 GM/50ML-% IV SOLN
INTRAVENOUS | Status: AC
  Filled 2019-03-29: qty 50

## 2019-03-29 MED ORDER — FENTANYL CITRATE (PF) 100 MCG/2ML IJ SOLN
INTRAMUSCULAR | Status: DC | PRN
  Administered 2019-03-29: 25 ug via INTRAVENOUS
  Administered 2019-03-29: 50 ug via INTRAVENOUS
  Administered 2019-03-29: 25 ug via INTRAVENOUS

## 2019-03-29 MED ORDER — LIDOCAINE-EPINEPHRINE 1 %-1:100000 IJ SOLN
INTRAMUSCULAR | Status: AC
  Filled 2019-03-29: qty 1

## 2019-03-29 MED ORDER — ACETAMINOPHEN 650 MG RE SUPP
650.0000 mg | RECTAL | Status: DC | PRN
  Filled 2019-03-29: qty 1

## 2019-03-29 MED ORDER — HYDROCODONE-ACETAMINOPHEN 7.5-325 MG PO TABS
1.0000 | ORAL_TABLET | Freq: Once | ORAL | Status: AC | PRN
  Administered 2019-03-29: 1 via ORAL
  Filled 2019-03-29: qty 1

## 2019-03-29 MED ORDER — LIDOCAINE-EPINEPHRINE 1 %-1:100000 IJ SOLN
INTRAMUSCULAR | Status: AC
  Filled 2019-03-29: qty 2

## 2019-03-29 MED ORDER — MEPERIDINE HCL 50 MG/ML IJ SOLN: 6.2500 mg | INTRAMUSCULAR | Status: DC | PRN

## 2019-03-29 MED ORDER — FENTANYL CITRATE (PF) 100 MCG/2ML IJ SOLN
25.0000 ug | INTRAMUSCULAR | Status: DC | PRN
  Administered 2019-03-29: 25 ug via INTRAVENOUS

## 2019-03-29 MED ORDER — LIDOCAINE HCL (CARDIAC) PF 100 MG/5ML IV SOSY
PREFILLED_SYRINGE | INTRAVENOUS | Status: DC | PRN
  Administered 2019-03-29: 60 mg via INTRAVENOUS

## 2019-03-29 MED ORDER — OXYCODONE-ACETAMINOPHEN 5-325 MG PO TABS: 1.0000 | ORAL_TABLET | ORAL | Status: DC | PRN

## 2019-03-29 MED ORDER — MIDAZOLAM HCL 2 MG/2ML IJ SOLN
INTRAMUSCULAR | Status: DC | PRN
  Administered 2019-03-29: 2 mg via INTRAVENOUS

## 2019-03-29 MED ORDER — ACETAMINOPHEN 325 MG PO TABS: 650.0000 mg | ORAL_TABLET | ORAL | Status: DC | PRN

## 2019-03-29 MED ORDER — DEXAMETHASONE SODIUM PHOSPHATE 10 MG/ML IJ SOLN
INTRAMUSCULAR | Status: DC | PRN
  Administered 2019-03-29: 10 mg via INTRAVENOUS

## 2019-03-29 SURGICAL SUPPLY — 57 items
BAG URINE DRAINAGE (UROLOGICAL SUPPLIES) ×2 IMPLANT
BLADE SURG 15 STRL LF DISP TIS (BLADE) ×1 IMPLANT
BLADE SURG 15 STRL SS (BLADE) ×1
BLADE SURG SZ10 CARB STEEL (BLADE) ×2 IMPLANT
BNDG GAUZE 4.5X4.1 6PLY STRL (MISCELLANEOUS) ×2 IMPLANT
CANISTER SUCT 1200ML W/VALVE (MISCELLANEOUS) ×2 IMPLANT
CATH FOLEY 2WAY  5CC 16FR (CATHETERS) ×1
CATH URTH 16FR FL 2W BLN LF (CATHETERS) ×1 IMPLANT
COVER WAND RF STERILE (DRAPES) ×2 IMPLANT
DERMABOND ADVANCED (GAUZE/BANDAGES/DRESSINGS) ×1
DERMABOND ADVANCED .7 DNX12 (GAUZE/BANDAGES/DRESSINGS) ×1 IMPLANT
DRAPE 3/4 80X56 (DRAPES) ×2 IMPLANT
DRAPE LAPAROTOMY 100X77 ABD (DRAPES) ×2 IMPLANT
DRAPE PERI LITHO V/GYN (MISCELLANEOUS) ×2 IMPLANT
DRAPE UNDER BUTTOCK W/FLU (DRAPES) ×2 IMPLANT
ELECT CAUTERY BLADE 6.4 (BLADE) ×2 IMPLANT
ELECT REM PT RETURN 9FT ADLT (ELECTROSURGICAL) ×2
ELECTRODE REM PT RTRN 9FT ADLT (ELECTROSURGICAL) ×1 IMPLANT
GAUZE 4X4 16PLY RFD (DISPOSABLE) ×4 IMPLANT
GAUZE PACK 2X3YD (GAUZE/BANDAGES/DRESSINGS) ×2 IMPLANT
GLOVE BIO SURGEON STRL SZ8 (GLOVE) ×4 IMPLANT
GOWN STRL REUS W/ TWL LRG LVL3 (GOWN DISPOSABLE) ×3 IMPLANT
GOWN STRL REUS W/ TWL XL LVL3 (GOWN DISPOSABLE) ×1 IMPLANT
GOWN STRL REUS W/TWL LRG LVL3 (GOWN DISPOSABLE) ×3
GOWN STRL REUS W/TWL XL LVL3 (GOWN DISPOSABLE) ×1
IV LACTATED RINGERS 1000ML (IV SOLUTION) ×2 IMPLANT
KIT TURNOVER CYSTO (KITS) ×2 IMPLANT
KIT TURNOVER KIT A (KITS) ×2 IMPLANT
LABEL OR SOLS (LABEL) ×2 IMPLANT
NDL HPO THNWL 1X22GA REG BVL (NEEDLE) ×1 IMPLANT
NDL SAFETY ECLIPSE 18X1.5 (NEEDLE) ×1 IMPLANT
NEEDLE HYPO 18GX1.5 SHARP (NEEDLE) ×1
NEEDLE HYPO 22GX1.5 SAFETY (NEEDLE) ×2 IMPLANT
NEEDLE SAFETY 22GX1 (NEEDLE) ×1
NEEDLE SPNL 22GX3.5 QUINCKE BK (NEEDLE) ×2 IMPLANT
NS IRRIG 500ML POUR BTL (IV SOLUTION) ×2 IMPLANT
PACK BASIN MINOR ARMC (MISCELLANEOUS) ×2 IMPLANT
PAD OB MATERNITY 4.3X12.25 (PERSONAL CARE ITEMS) ×2 IMPLANT
PAD PREP 24X41 OB/GYN DISP (PERSONAL CARE ITEMS) ×2 IMPLANT
SET CYSTO W/LG BORE CLAMP LF (SET/KITS/TRAYS/PACK) ×2 IMPLANT
SLING TVT EXACT (Sling) ×2 IMPLANT
SOL PREP PVP 2OZ (MISCELLANEOUS) ×2
SOLUTION PREP PVP 2OZ (MISCELLANEOUS) ×1 IMPLANT
STRAP SAFETY 5IN WIDE (MISCELLANEOUS) ×2 IMPLANT
SURGILUBE 2OZ TUBE FLIPTOP (MISCELLANEOUS) ×2 IMPLANT
SUT ETHIBOND NAB CT1 #1 30IN (SUTURE) ×8 IMPLANT
SUT VIC AB 0 CT1 27 (SUTURE) ×1
SUT VIC AB 0 CT1 27XCR 8 STRN (SUTURE) ×1 IMPLANT
SUT VIC AB 2-0 CT1 (SUTURE) ×4 IMPLANT
SUT VIC AB 2-0 CT1 27 (SUTURE) ×1
SUT VIC AB 2-0 CT1 TAPERPNT 27 (SUTURE) ×1 IMPLANT
SUT VIC AB 3-0 SH 27 (SUTURE) ×1
SUT VIC AB 3-0 SH 27X BRD (SUTURE) ×1 IMPLANT
SUT VICRYL+ 3-0 36IN CT-1 (SUTURE) ×2 IMPLANT
SYR 10ML LL (SYRINGE) ×2 IMPLANT
SYR CONTROL 10ML LL (SYRINGE) ×2 IMPLANT
SYRINGE IRR TOOMEY STRL 70CC (SYRINGE) ×2 IMPLANT

## 2019-03-29 NOTE — Discharge Instructions (Signed)
Urethral Vaginal Sling, Care After This sheet gives you information about how to care for yourself after your procedure. Your health care provider may also give you more specific instructions. If you have problems or questions, contact your health care provider. What can I expect after the procedure? After the procedure:  It is common to have some abdominal pain. Your health care provider will give you pain medicines for this.  You may also have a gauze packing in the vagina to prevent bleeding. This will be removed in 1-2 days. Follow these instructions at home: Incision care   Follow instructions from your health care provider about how to take care of your abdominal incision. Make sure you: ? Wash your hands with soap and water before you change your bandage (dressing). If soap and water are not available, use hand sanitizer. ? Change your dressing as told by your health care provider. ? Leave stitches (sutures), skin glue, or adhesive strips in place. These skin closures may need to stay in place for 2 weeks or longer. If adhesive strip edges start to loosen and curl up, you may trim the loose edges. Do not remove adhesive strips completely unless your health care provider tells you to do that.  Check your incision area every day for signs of infection. Check for: ? Redness, swelling, or pain. ? Fluid or blood. ? Warmth. ? Pus or a bad smell. Activity  Get plenty of rest.  Limit exercise and activities as told by your health care provider.  Do not lift anything that is heavier than 5 pounds (2.3 kg) until your health care provider says that it is safe.  Do not douche, use tampons, or have sexual intercourse for 6 weeks after your procedure or as told by your health care provider.  Do not drive or use heavy machinery while taking prescription pain medicine.  Return to your normal activities as told by your health care provider. Ask your health care provider what activities are  safe for you. General instructions  If you have a urinary catheter in place, follow instructions from your health care provider about how to empty the catheter bag.  Do not take baths, swim, or use a hot tub until your health care provider approves. Ask your health care provider if you may take showers. You may only be allowed to take sponge baths.  Take over-the-counter and prescription medicines only as told by your health care provider. Do not take aspirin because it can cause bleeding.  Do not use any products that contain nicotine or tobacco, such as cigarettes and e-cigarettes. These can delay bone healing. If you need help quitting, ask your health care provider.  You may resume your usual diet. Eat a well-balanced diet.  To prevent or treat constipation while you are taking prescription pain medicine, your health care provider may recommend that you: ? Drink enough fluid to keep your urine pale yellow. ? Take over-the-counter or prescription medicines. ? Eat foods that are high in fiber, such as fresh fruits and vegetables, whole grains, and beans. ? Limit foods that are high in fat and processed sugars, such as fried and sweet foods.  Avoid straining when having a bowel movement.  Keep all follow-up visits as told by your health care provider. This is important. Contact a health care provider if:  You have a heavy or bad smelling vaginal discharge.  You have bruising in the vaginal area.  You have pain that is not controlled with medicines.  Your incision feels warm to the touch.  You have redness, swelling, or pain around your incision.  You have pus or a bad smell coming from your incision.  You have fluid or blood coming from your incision.  You feel faint or light-headed.  You have a rash. Get help right away if:  You have a fever.  You faint.  You have shortness of breath.  You have chest, abdominal, or leg pain.  You have pain when urinating or cannot  urinate.  Your catheter is still in your bladder and it becomes blocked.  You have vaginal bleeding.  You have swelling, redness, and pain in the vaginal area. Summary  After the procedure, it is common to have some abdominal pain. Your health care provider will give you pain medicines for this.  Follow instructions from your health care provider about how to take care of your incision.  Limit exercise and activities as told by your health care provider.  Contact your health care provider if you have any signs of infection after your surgery. This information is not intended to replace advice given to you by your health care provider. Make sure you discuss any questions you have with your health care provider. Document Released: 05/02/2013 Document Revised: 06/24/2017 Document Reviewed: 10/20/2016 Elsevier Patient Education  2020 [employer]   1) The drugs that you were given will stay in your system until tomorrow so for the next 24 hours you should not:  A) Drive an automobile B) Make any legal decisions C) Drink any alcoholic beverage   2) You may resume regular meals tomorrow.  Today it is better to start with liquids and gradually work up to solid foods.  You may eat anything you prefer, but it is better to start with liquids, then soup and crackers, and gradually work up to solid foods.   3) Please notify your doctor immediately if you have any unusual bleeding, trouble breathing, redness and pain at the surgery site, drainage, fever, or pain not relieved by medication.    4) Additional Instructions: I and O cath yourself every 4-6 unable to empty bladder.        Please contact your physician with any problems or Same Day Surgery at [phone], Monday through Friday 6 am to 4 pm, or  at [facility] number at [phone].

## 2019-03-29 NOTE — Transfer of Care (Signed)
Immediate Anesthesia Transfer of Care Note  Patient: Jacqueline Howell  Procedure(s) Performed: ANTERIOR REPAIR (CYSTOCELE) (N/A ) TRANSVAGINAL TAPE (TVT) PROCEDURE (N/A ) CYSTOSCOPY (N/A )  Patient Location: PACU  Anesthesia Type:General  Level of Consciousness: awake, alert  and oriented  Airway & Oxygen Therapy: Patient Spontanous Breathing and Patient connected to face mask oxygen  Post-op Assessment: Report given to RN and Post -op Vital signs reviewed and stable  Post vital signs: Reviewed and stable  Last Vitals:  Vitals Value Taken Time  BP 122/80 03/29/19 1324  Temp    Pulse 91 03/29/19 1327  Resp 19 03/29/19 1327  SpO2 99 % 03/29/19 1327  Vitals shown include unvalidated device data.  Last Pain:  Vitals:   03/29/19 1324  TempSrc:   PainSc: (P) Asleep         Complications: No apparent anesthesia complications

## 2019-03-29 NOTE — Progress Notes (Signed)
Vaginal packing removed. 360mL sterile water instilled in to bladder before removing foley.

## 2019-03-29 NOTE — Progress Notes (Signed)
Dr Kenton Kingfisher in to see pt, pt cathed per MD for 700cc of urine, foley dced and pt taught how to self cath, pt verbalized understanding of this

## 2019-03-29 NOTE — Interval H&P Note (Signed)
History and Physical Interval Note:  03/29/2019 11:28 AM  Jacqueline Howell  has presented today for surgery, with the diagnosis of Mixed urinary incontinence N39.46.  The various methods of treatment have been discussed with the patient and family. After consideration of risks, benefits and other options for treatment, the patient has consented to  Procedure(s): ANTERIOR REPAIR (CYSTOCELE) (N/A) TRANSVAGINAL TAPE (TVT) PROCEDURE (N/A) CYSTOSCOPY (N/A) as a surgical intervention.  The patient's history has been reviewed, patient examined, no change in status, stable for surgery.  I have reviewed the patient's chart and labs.  Questions were answered to the patient's satisfaction.     Hoyt Koch

## 2019-03-29 NOTE — Progress Notes (Signed)
Pt voided 50cc , bladder scanned for 571 cc, Dr Kenton Kingfisher made aware

## 2019-03-29 NOTE — Progress Notes (Signed)
Pt still passing clots, Dr Kenton Kingfisher in route to see pt

## 2019-03-29 NOTE — Progress Notes (Signed)
Bladder scanned for 270 cc

## 2019-03-29 NOTE — Anesthesia Procedure Notes (Signed)
Procedure Name: LMA Insertion Date/Time: 03/29/2019 12:08 PM Performed by: Hedda Slade, CRNA Pre-anesthesia Checklist: Patient identified, Patient being monitored, Timeout performed, Emergency Drugs available and Suction available Patient Re-evaluated:Patient Re-evaluated prior to induction Oxygen Delivery Method: Circle system utilized Preoxygenation: Pre-oxygenation with 100% oxygen Induction Type: IV induction Ventilation: Mask ventilation without difficulty LMA: LMA inserted LMA Size: 4.0 Tube type: Oral Number of attempts: 1 Placement Confirmation: positive ETCO2 and breath sounds checked- equal and bilateral Tube secured with: Tape Dental Injury: Teeth and Oropharynx as per pre-operative assessment

## 2019-03-29 NOTE — Op Note (Signed)
Operative Note:  PRE-OP DIAGNOSIS: Mixed urinary incontinence N39.46   POST-OP DIAGNOSIS: Mixed urinary incontinence N39.46   PROCEDURE: Procedure(s): ANTERIOR REPAIR (CYSTOCELE) TRANSVAGINAL TAPE (TVT) PROCEDURE CYSTOSCOPY  SURGEON: Barnett Applebaum, MD, FACOG  ASSISTANT: None   ANESTHESIA: General endotracheal anesthesia  ESTIMATED BLOOD LOSS: Minimal  SPECIMENS: none.  COMPLICATIONS: None  DISPOSITION: stable to PACU  FINDINGS: Cystocele  PROCEDURE:   Patient was taken to the OR where she was placed in dorsal lithotomy in candy cane stirrups. She was prepped and draped in the usual sterile fashion. A timeout was performed. Foley is placed into bladder. A speculum was placed inside the vagina. Above findings noted.  Anterior colporrhaphy is performed.  Allis clamps are placed along the anterior vaginal wall, lidocaine is used to infiltrate the plane, and incision is made midline vertical.  Endopelvic fascia is dissected free of vaginal mucosa.    The retropubic space is carefully dissected both with sharp and blunt dissection.  The rigid catheter guide is placed in the foley with appropriate deviation of the bladder while placing the sling.  Using the TVT Exact device and material, the trocar is placed through the right then the left retropubic space and then trough an exit incision in the mons pubis.  Cystoscopy is performed with saline distension of the bladder with no perforations noted.  200 mL is left in bladder.  The TVT sling is then pulled upward until contact is made along the bladder neck, and a Kelly clamp is used to ensure it is not cinched too tightly.  Suprapubic pressure is performed to ensure no leakage of urine.  The sleeves are then removed from the sling.  Incisions are closed with Dermabond.  Fascia is plicated w interrupted vicryl sutures.  Tissue is plicated over the sling as well to provide additional support.  Excess mucosa is excised.  Vaginal incision is  closed with a running locking Vicryl suture.  Excellent hemostasis was noted at the end of the case.   A Foley catheter is left in  place inside her bladder. Clear, yellow urine was noted. All instrument needle and lap counts were correct x 2. Patient was awakened taken to recovery room in stable condition.  Barnett Applebaum, MD, [facility], [facility] 03/29/2019  1:29 PM

## 2019-03-29 NOTE — Anesthesia Preprocedure Evaluation (Addendum)
Anesthesia Evaluation  Patient identified by MRN, date of birth, ID band Patient awake    Reviewed: Allergy & Precautions, H&P , NPO status , reviewed documented beta blocker date and time   Airway Mallampati: II  TM Distance: >3 FB     Dental  (+) Teeth Intact   Pulmonary    Pulmonary exam normal        Cardiovascular Normal cardiovascular exam     Neuro/Psych  Headaches, PSYCHIATRIC DISORDERS Anxiety Depression    GI/Hepatic neg GERD  ,  Endo/Other    Renal/GU      Musculoskeletal   Abdominal   Peds  Hematology   Anesthesia Other Findings Past Medical History: No date: Depression 03/2018: IBS (irritable bowel syndrome) 05/2016: Vitamin D deficiency Past Surgical History: 04/24/2018: COLONOSCOPY     Comment:  repeat due in 5 yrs; Dr. Alfredia Ferguson No date: [place] BMI    Body Mass Index: 22.06 kg/m     Reproductive/Obstetrics                            Anesthesia Physical Anesthesia Plan  ASA: II  Anesthesia Plan: General   Post-op Pain Management:    Induction: Intravenous  PONV Risk Score and Plan: Ondansetron and Treatment may vary due to age or medical condition  Airway Management Planned: LMA  Additional Equipment:   Intra-op Plan:   Post-operative Plan: Extubation in OR  Informed Consent: I have reviewed the patients History and Physical, chart, labs and discussed the procedure including the risks, benefits and alternatives for the proposed anesthesia with the patient or authorized representative who has indicated his/her understanding and acceptance.     Dental Advisory Given  Plan Discussed with: CRNA  Anesthesia Plan Comments:         Anesthesia Quick Evaluation

## 2019-03-29 NOTE — Progress Notes (Signed)
Dr Kenton Kingfisher at bedside,aware of blood clots and need to void

## 2019-03-29 NOTE — Anesthesia Post-op Follow-up Note (Signed)
Anesthesia QCDR form completed.        

## 2019-03-30 ENCOUNTER — Encounter: Payer: Self-pay | Admitting: Obstetrics & Gynecology

## 2019-03-30 ENCOUNTER — Other Ambulatory Visit: Payer: Self-pay | Admitting: Obstetrics and Gynecology

## 2019-03-30 ENCOUNTER — Telehealth: Payer: Self-pay | Admitting: Obstetrics & Gynecology

## 2019-03-30 MED ORDER — HYDROCODONE-ACETAMINOPHEN 5-325 MG PO TABS: 1.0000 | ORAL_TABLET | Freq: Four times a day (QID) | ORAL | 0 refills | Status: DC | PRN

## 2019-03-30 NOTE — Telephone Encounter (Signed)
Pt want to know if we can change her pain meds, she is having hives all over, pt had surgery yesterday. also is it fine to put ice on incisions because they are super swollen, Please advise since [facility] is not in the office today,

## 2019-03-30 NOTE — Progress Notes (Signed)
Reports hives with percocet, switched to Vicodin.  Discussed swelling at sling arm sites is not uncommon.  May apply ice, ibuprofen prn

## 2019-03-30 NOTE — Telephone Encounter (Signed)
Patient's Husband is call due to patient having anallergic reaction to her pain medication for percot. He reports patient breaking out in hives. He is wanting to know if something else could be sent in for her. He also has questions about her swelling at her incision site and wound care. Please advise

## 2019-03-30 NOTE — Telephone Encounter (Signed)
Called pt back, left message to return call.

## 2019-04-03 ENCOUNTER — Encounter: Payer: Self-pay | Admitting: Obstetrics & Gynecology

## 2019-04-03 ENCOUNTER — Other Ambulatory Visit: Payer: Self-pay | Admitting: Obstetrics & Gynecology

## 2019-04-03 DIAGNOSIS — Z1231 Encounter for screening mammogram for malignant neoplasm of breast: Secondary | ICD-10-CM

## 2019-04-03 NOTE — Telephone Encounter (Signed)
Patient is calling back due to missed call. Please advise

## 2019-04-03 NOTE — Telephone Encounter (Signed)
Call and check on patient for me, see how she is doing (let her know I did not receive MyChart messages on weekend, til now).  Does she still need change in pain medicine? Thx

## 2019-04-03 NOTE — Telephone Encounter (Signed)
Please advise 

## 2019-04-04 ENCOUNTER — Ambulatory Visit
Admission: RE | Admit: 2019-04-04 | Discharge: 2019-04-04 | Disposition: A | Discharge status: Home / Self Care | Payer: BC Managed Care – PPO | Source: Ambulatory Visit | Attending: Obstetrics & Gynecology | Admitting: Obstetrics & Gynecology

## 2019-04-04 DIAGNOSIS — Z1231 Encounter for screening mammogram for malignant neoplasm of breast: Secondary | ICD-10-CM | POA: Diagnosis not present

## 2019-04-05 DIAGNOSIS — J301 Allergic rhinitis due to pollen: Secondary | ICD-10-CM | POA: Diagnosis not present

## 2019-04-05 NOTE — Anesthesia Postprocedure Evaluation (Signed)
Anesthesia Post Note  Patient: Jacqueline Howell  Procedure(s) Performed: ANTERIOR REPAIR (CYSTOCELE) (N/A ) TRANSVAGINAL TAPE (TVT) PROCEDURE (N/A ) CYSTOSCOPY (N/A )  Patient location during evaluation: PACU Anesthesia Type: General Level of consciousness: awake and alert Pain management: pain level controlled Vital Signs Assessment: post-procedure vital signs reviewed and stable Respiratory status: spontaneous breathing, nonlabored ventilation and respiratory function stable Cardiovascular status: blood pressure returned to baseline and stable Postop Assessment: no apparent nausea or vomiting Anesthetic complications: no     Last Vitals:  Vitals:   03/29/19 1501 03/29/19 1800  BP: (!) 99/55 (!) 101/55  Pulse: 76 75  Resp: 16 18  Temp: 36.7 C   SpO2: 100% 100%    Last Pain:  Vitals:   03/29/19 1618  TempSrc:   PainSc: 6                  Marcial Pless Harvie Heck

## 2019-04-09 DIAGNOSIS — J301 Allergic rhinitis due to pollen: Secondary | ICD-10-CM | POA: Diagnosis not present

## 2019-04-12 ENCOUNTER — Ambulatory Visit (INDEPENDENT_AMBULATORY_CARE_PROVIDER_SITE_OTHER): Payer: BC Managed Care – PPO | Admitting: Obstetrics & Gynecology

## 2019-04-12 ENCOUNTER — Encounter: Payer: Self-pay | Admitting: Obstetrics & Gynecology

## 2019-04-12 ENCOUNTER — Other Ambulatory Visit: Payer: Self-pay

## 2019-04-12 VITALS — Ht 66.0 in | Wt 135.0 lb

## 2019-04-12 DIAGNOSIS — N3946 Mixed incontinence: Secondary | ICD-10-CM

## 2019-04-12 DIAGNOSIS — J301 Allergic rhinitis due to pollen: Secondary | ICD-10-CM | POA: Diagnosis not present

## 2019-04-12 NOTE — Progress Notes (Signed)
Virtual Visit via Telephone Note  I connected with Jacqueline Howell on 04/12/19 at  4:10 PM EDT by telephone and verified that I am speaking with the correct person using two identifiers.   I discussed the limitations, risks, security and privacy concerns of performing an evaluation and management service by telephone and the availability of in person appointments. I also discussed with the patient that there may be a patient responsible charge related to this service. The patient expressed understanding and agreed to proceed. She was at home and I was in my office.  History of Present Illness: Postoperative Follow-up Patient presents post op from anterior colporrhaphy and sling for urinary incontinence, 2 weeks ago.  Subjective: Patient reports marked improvement in her preop symptoms. Eating a regular diet without difficulty. Pain is controlled with current analgesics. Medications being used: ibuprofen (OTC).  Activity: no heavy lifting. Patient reports additional symptom's since surgery of No bleeding.    Observations/Objective: No exam today, due to telephone eVisit due to [facility] virus restriction on elective visits and procedures.  Prior visits reviewed along with ultrasounds/labs as indicated.  Assessment and Plan: 1. Mixed stress and urge urinary incontinence  Patient has done well after surgery with no apparent complications.  I have discussed the post-operative course to date, and the expected progress moving forward.  The patient understands what complications to be concerned about.  I will see the patient in routine follow up, or sooner if needed.  Follow Up Instructions: 4 weeks    I discussed the assessment and treatment plan with the patient. The patient was provided an opportunity to ask questions and all were answered. The patient agreed with the plan and demonstrated an understanding of the instructions.  Cont no lifting >10 lbs for 4 more weeks.   The patient was advised  to call back or seek an in-person evaluation if the symptoms worsen or if the condition fails to improve as anticipated.  I provided 11 minutes of non-face-to-face time during this encounter.   Hoyt Koch, MD

## 2019-04-14 ENCOUNTER — Encounter: Payer: Self-pay | Admitting: Obstetrics & Gynecology

## 2019-04-16 ENCOUNTER — Other Ambulatory Visit: Payer: Self-pay | Admitting: Obstetrics & Gynecology

## 2019-04-16 DIAGNOSIS — J301 Allergic rhinitis due to pollen: Secondary | ICD-10-CM | POA: Diagnosis not present

## 2019-04-16 MED ORDER — HYDROCODONE-ACETAMINOPHEN 5-325 MG PO TABS: 1.0000 | ORAL_TABLET | Freq: Four times a day (QID) | ORAL | 0 refills | Status: DC | PRN

## 2019-04-19 NOTE — Telephone Encounter (Signed)
Does she need to come in for this? Please advise

## 2019-04-23 ENCOUNTER — Telehealth: Payer: Self-pay | Admitting: Obstetrics & Gynecology

## 2019-04-23 ENCOUNTER — Other Ambulatory Visit: Payer: Self-pay

## 2019-04-23 ENCOUNTER — Ambulatory Visit (INDEPENDENT_AMBULATORY_CARE_PROVIDER_SITE_OTHER): Payer: BC Managed Care – PPO | Admitting: Obstetrics & Gynecology

## 2019-04-23 ENCOUNTER — Encounter: Payer: Self-pay | Admitting: Obstetrics & Gynecology

## 2019-04-23 VITALS — BP 120/80 | Ht 66.0 in | Wt 138.0 lb

## 2019-04-23 DIAGNOSIS — N3943 Post-void dribbling: Secondary | ICD-10-CM

## 2019-04-23 NOTE — Telephone Encounter (Signed)
Patient is schedule 05/23/19 at 10:20 with [facility]

## 2019-04-23 NOTE — Progress Notes (Signed)
  Postoperative Follow-up Patient presents post op from anterior colporrhaphy and sling for urinary incontinence, 1 month ago.  Subjective: Patient reports recent onset post void dribbling (last 3 days; she had prior been recovering well and reports no pain, bleeding, or stress incontinence since the procedure.  She did have post op urinary retention requiring use of foley self cath for one week.  Denies nocturia, frequency, urgency.  Objective: BP 120/80   Ht 5\' 6"  (1.676 m)   Wt 138 lb (62.6 kg)   BMI 22.27 kg/m  Physical Exam Constitutional:      General: She is not in acute distress.    Appearance: She is well-developed.  Genitourinary:     Pelvic exam was performed with patient supine.     Vagina, uterus and rectum normal.     No vaginal erythema or bleeding.     No cervical motion tenderness, discharge or lesion.     Uterus is not enlarged or tender.     No uterine mass detected.    Uterus is midaxial.     No right or left adnexal mass present.     Right adnexa not tender.     Left adnexa not tender.     Genitourinary Comments: Well healed vaginal surgery area. No mesh erosion No distortion along urethra and ant vag wall  Cardiovascular:     Rate and Rhythm: Normal rate.  Pulmonary:     Effort: Pulmonary effort is normal.  Abdominal:     General: There is no distension.     Palpations: Abdomen is soft.     Tenderness: There is no abdominal tenderness.     Comments: Incision healing well.  Musculoskeletal: Normal range of motion.  Neurological:     Mental Status: She is alert and oriented to person, place, and time.     Cranial Nerves: No cranial nerve deficit.  Skin:    General: Skin is warm and dry.     Assessment: s/p :  anterior colporrhaphy and sling for incontinence progressing well Post void dribbling is an neurologic sequele likely temporary; no meds at this time, monitor for its resolution  Plan: Patient has done well after surgery with no apparent  complications.  I have discussed the post-operative course to date, and the expected progress moving forward.  The patient understands what complications to be concerned about.  I will see the patient in routine follow up, or sooner if needed.    Activity plan: No heavy lifting.Marland Kitchen  Pelvic rest.  Hoyt Koch 04/23/2019, 10:54 AM

## 2019-04-23 NOTE — Telephone Encounter (Signed)
Please call [phone]

## 2019-04-23 NOTE — Telephone Encounter (Signed)
OK to work in

## 2019-04-23 NOTE — Telephone Encounter (Signed)
Patient's husband is calling to see if patient could be seen today. Patient is complain of right side pain and uncontrolled bladder issues. Please advise work in

## 2019-04-25 ENCOUNTER — Ambulatory Visit: Payer: BC Managed Care – PPO | Admitting: Obstetrics & Gynecology

## 2019-06-05 ENCOUNTER — Other Ambulatory Visit: Payer: Self-pay

## 2019-06-05 ENCOUNTER — Ambulatory Visit (INDEPENDENT_AMBULATORY_CARE_PROVIDER_SITE_OTHER): Payer: Self-pay | Admitting: Obstetrics & Gynecology

## 2019-06-05 ENCOUNTER — Encounter: Payer: Self-pay | Admitting: Obstetrics & Gynecology

## 2019-06-05 VITALS — BP 116/72 | Ht 66.0 in | Wt 142.0 lb

## 2019-06-05 DIAGNOSIS — N3943 Post-void dribbling: Secondary | ICD-10-CM

## 2019-06-05 NOTE — Progress Notes (Signed)
  Postoperative Follow-up Patient presents post op from [place] and Sling for urinary incontinence, 2 months ago.  Subjective: Patient reports marked improvement in her preop symptoms. Eating a regular diet without difficulty. The patient is not having any pain.  Activity: normal activities of daily living. Patient reports additional symptom's since surgery of None.  Objective: BP 116/72   Ht 5\' 6"  (1.676 m)   Wt 142 lb (64.4 kg)   LMP 05/31/2019 (Exact Date)   BMI 22.92 kg/m  Physical Exam Constitutional:      General: She is not in acute distress.    Appearance: She is well-developed.  Genitourinary:     Pelvic exam was performed with patient supine.     Vagina, uterus and rectum normal.     No vaginal erythema or bleeding.     No cervical motion tenderness, discharge or lesion.     Uterus is not enlarged or tender.     No uterine mass detected.    Uterus is midaxial.     No right or left adnexal mass present.     Right adnexa not tender.     Left adnexa not tender.     Genitourinary Comments: Well healed vaginal surgery area. No mesh erosion No distortion along urethra and ant vag wall   Cardiovascular:     Rate and Rhythm: Normal rate.  Pulmonary:     Effort: Pulmonary effort is normal.  Abdominal:     General: There is no distension.     Palpations: Abdomen is soft.     Tenderness: There is no abdominal tenderness.  Musculoskeletal: Normal range of motion.  Neurological:     Mental Status: She is alert and oriented to person, place, and time.     Cranial Nerves: No cranial nerve deficit.  Skin:    General: Skin is warm and dry.     Assessment: s/p :  anterior colporrhaphy and Sling progressing well  Post Void Dribbling  Plan: Patient has done well after surgery with no apparent complications.  I have discussed the post-operative course to date, and the expected progress moving forward.  The patient understands what complications to be concerned about.  I will see  the patient in routine follow up, or sooner if needed.    Activity plan: No restriction.  Consider PT if continues w post void dribbling, but otherwise she is content with her recovery and improved incontinence sx's.  Hoyt Koch 06/05/2019, 3:41 PM

## 2019-09-22 ENCOUNTER — Ambulatory Visit: Payer: 59 | Attending: Internal Medicine

## 2019-09-22 DIAGNOSIS — Z23 Encounter for immunization: Secondary | ICD-10-CM | POA: Insufficient documentation

## 2019-09-22 NOTE — Progress Notes (Signed)
   Covid-19 Vaccination Clinic  Name:  Jacqueline Howell    MRN: [id number] DOB: 07-18-1976  09/22/2019  [name] was observed post Covid-19 immunization for 15 minutes without incidence. She was provided with Vaccine Information Sheet and instruction to access the V-Safe system.   [name] was instructed to call 911 with any severe reactions post vaccine: Marland Kitchen Difficulty breathing  . Swelling of your face and throat  . A fast heartbeat  . A bad rash all over your body  . Dizziness and weakness    Immunizations Administered    Name Date Dose VIS Date Route   Moderna COVID-19 Vaccine 09/22/2019 10:57 AM 0.5 mL 06/26/2019 Intramuscular   Manufacturer: Moderna   Lot: [id number]   NDC: 61224-497-53

## 2019-10-20 ENCOUNTER — Ambulatory Visit: Payer: 59

## 2019-10-23 ENCOUNTER — Ambulatory Visit: Payer: 59

## 2019-10-27 ENCOUNTER — Ambulatory Visit: Payer: 59 | Attending: Internal Medicine

## 2020-02-23 IMAGING — MG MM DIGITAL SCREENING BILAT W/ TOMO W/ CAD
8 series · 9 of 24 positions shown · non-contrast
Comparison: Previous exam(s).

CLINICAL DATA: Screening.

EXAM:
DIGITAL SCREENING BILATERAL MAMMOGRAM WITH TOMO AND CAD

[R CC synth-2D]
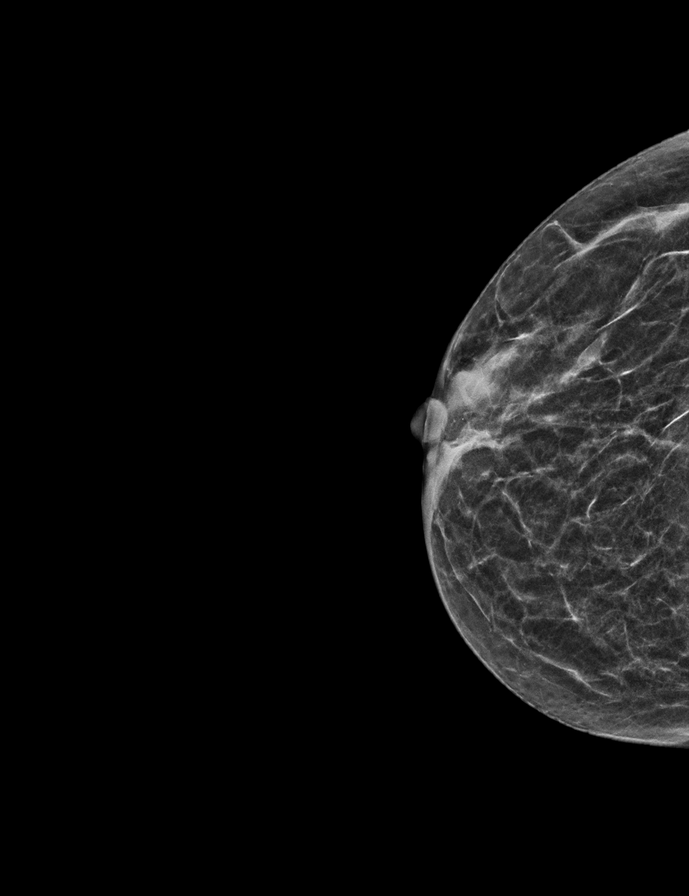

[R MLO synth-2D]
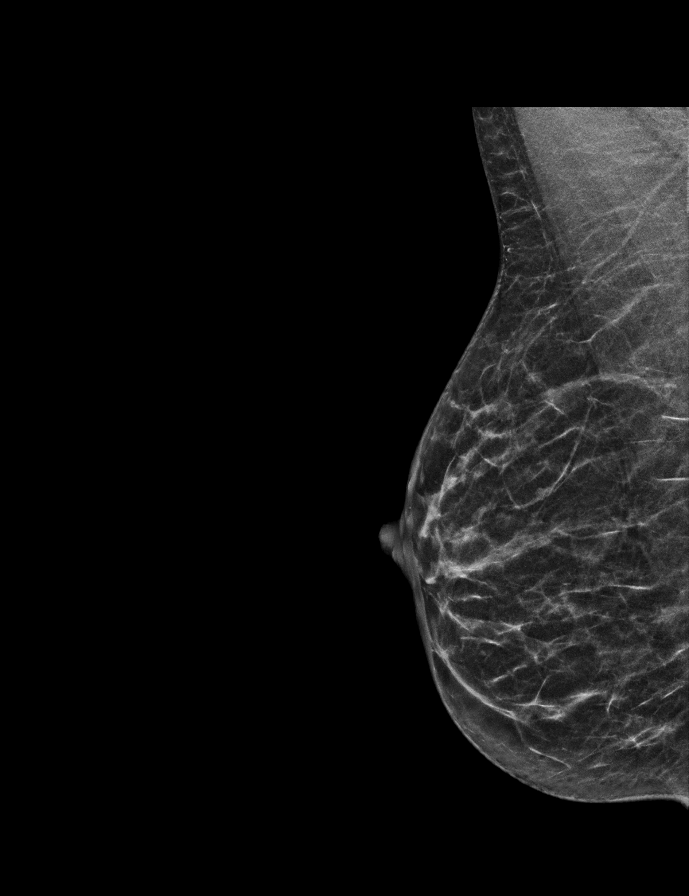

[L CC synth-2D]
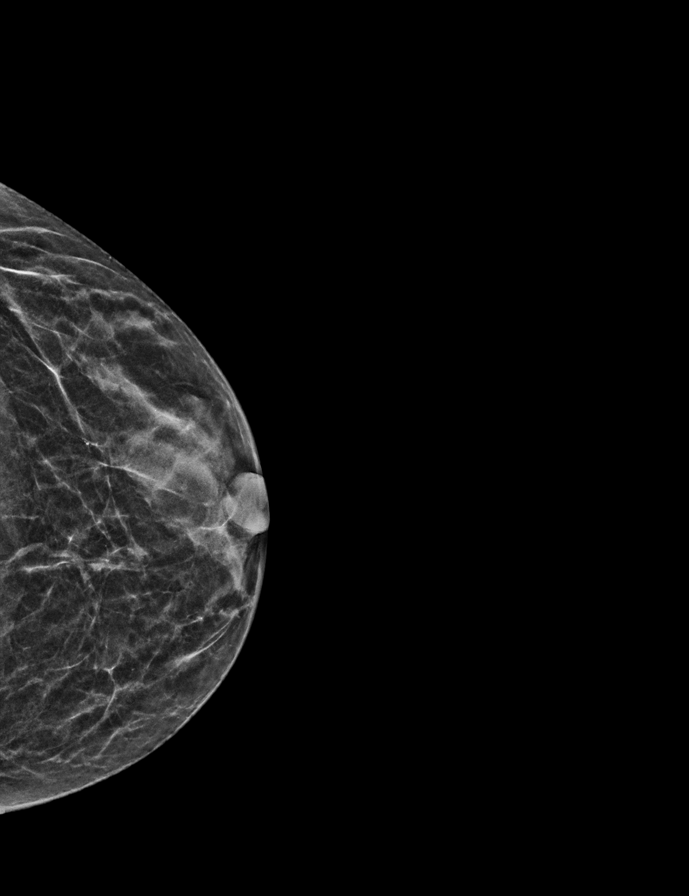

[L MLO synth-2D]
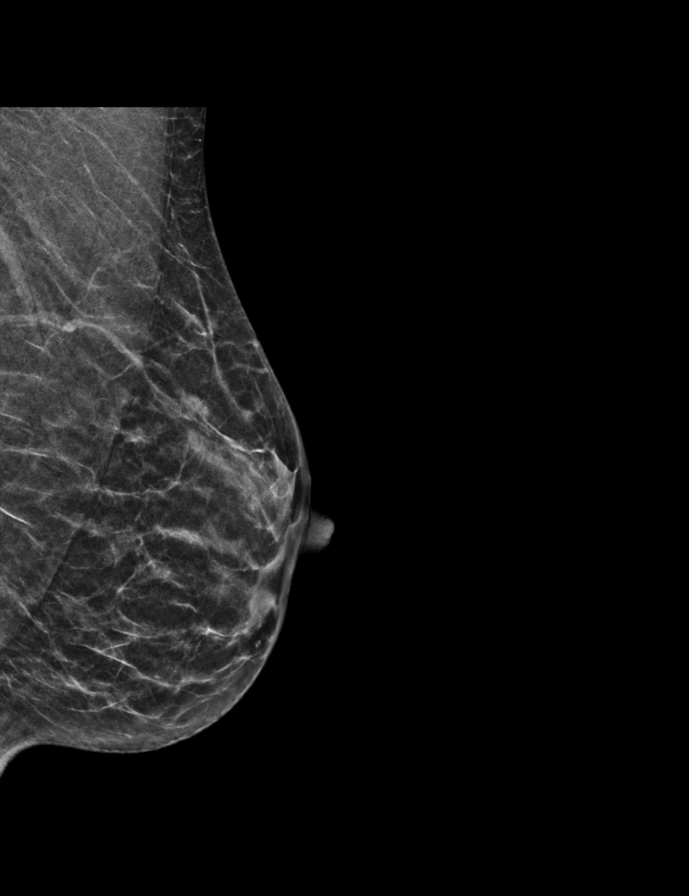

[R CC tomo · 2 of 36 frames shown]
[frame 12/36]
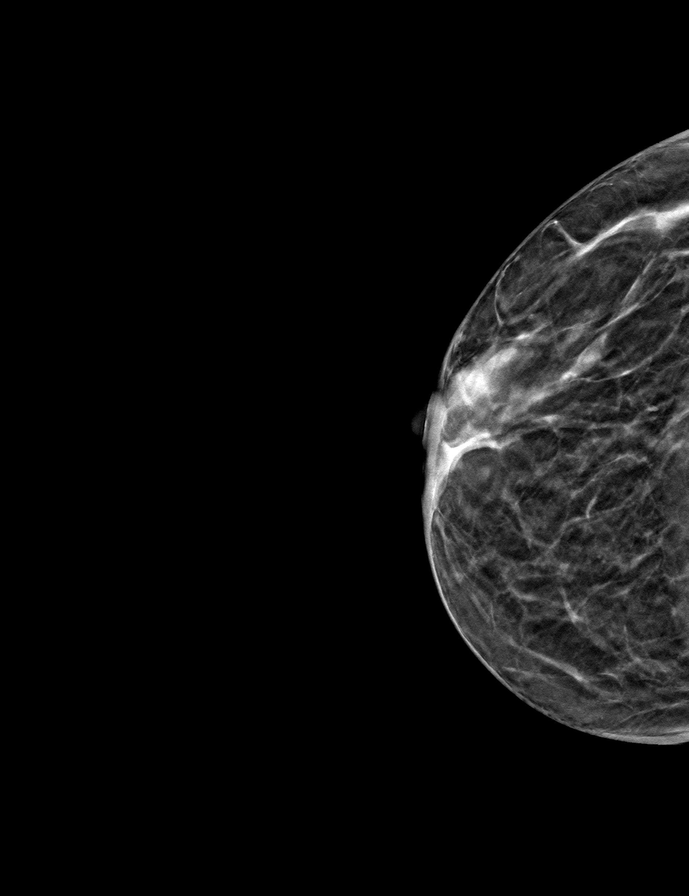
[frame 19/36]
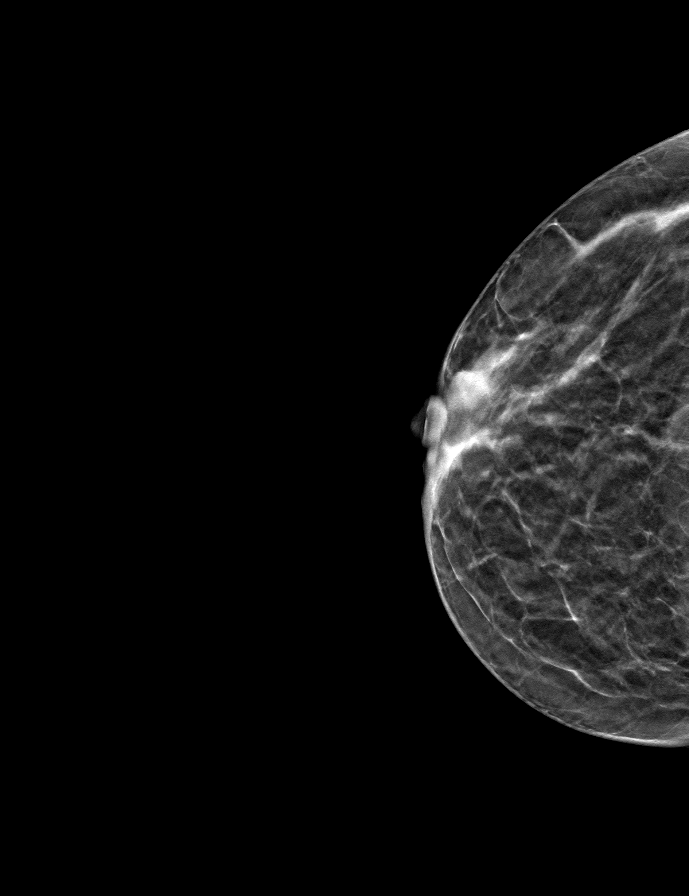

[L CC tomo · tomo slice 19/37.0]
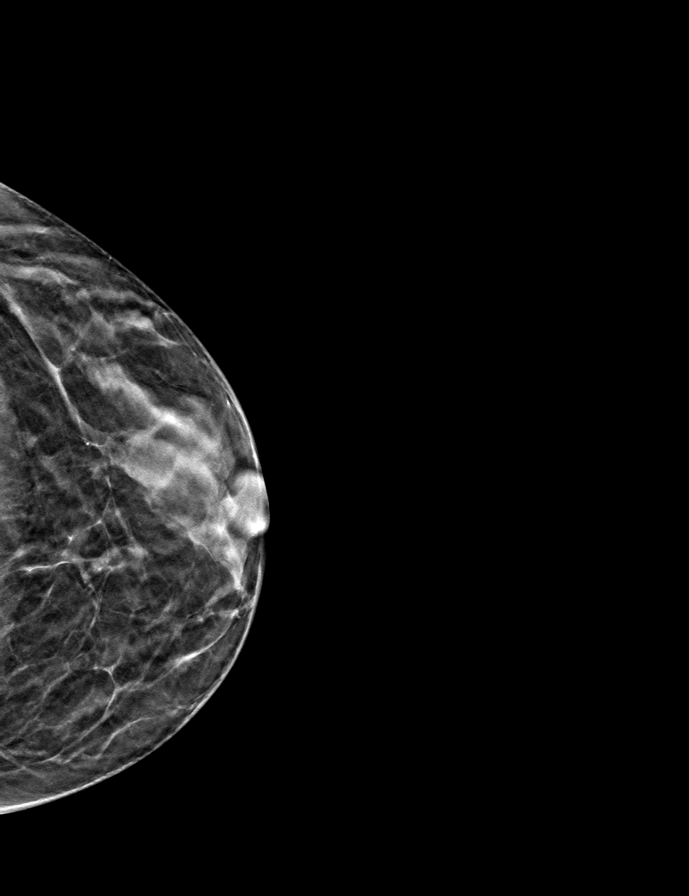

[L MLO tomo · tomo slice 19/38.0]
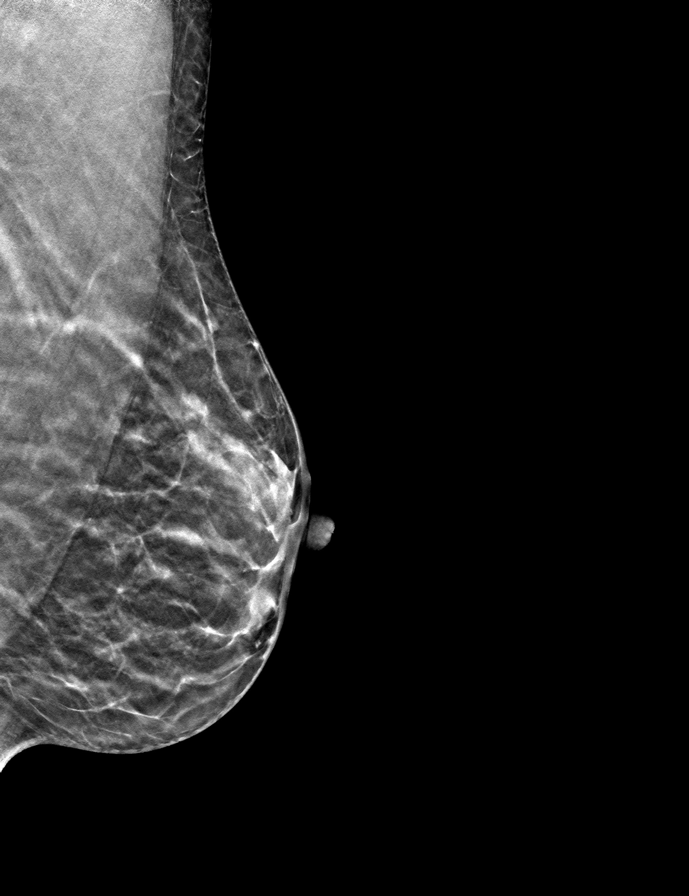

[R MLO tomo · tomo slice 21/40.0]
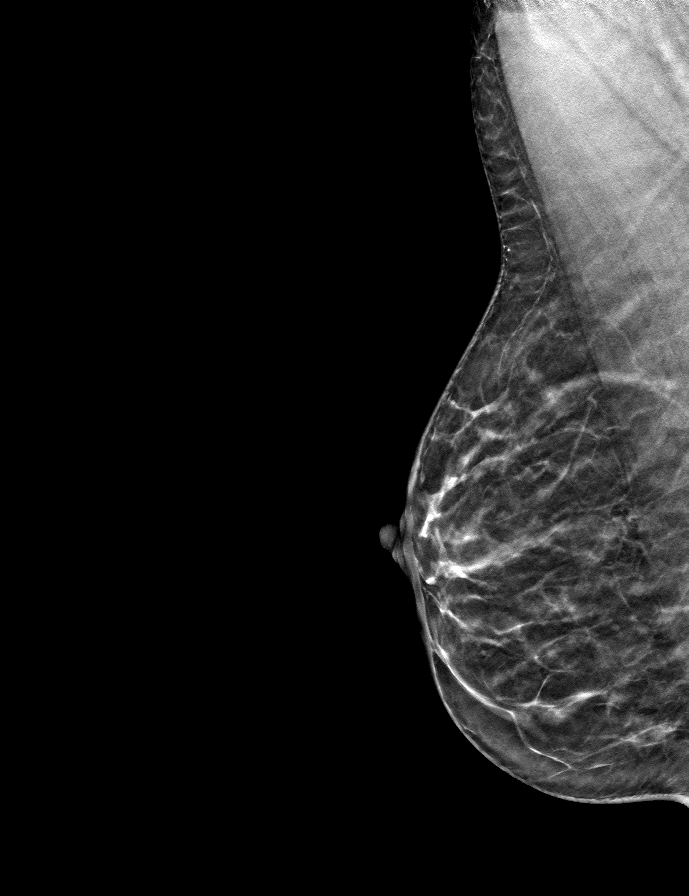

[9 of 24 positions shown; findings below may reference images not displayed]

ACR Breast Density Category b: There are scattered areas of
fibroglandular density.
FINDINGS: There are no findings suspicious for malignancy. Images were
processed with CAD.
IMPRESSION: No mammographic evidence of malignancy. A result letter of this
screening mammogram will be mailed directly to the patient.

RECOMMENDATION:
Screening mammogram in one year. (Code:[id number])

BI-RADS CATEGORY  1: Negative.

## 2020-02-26 DIAGNOSIS — M519 Unspecified thoracic, thoracolumbar and lumbosacral intervertebral disc disorder: Secondary | ICD-10-CM | POA: Insufficient documentation

## 2020-02-26 DIAGNOSIS — K5909 Other constipation: Secondary | ICD-10-CM | POA: Insufficient documentation

## 2020-05-14 ENCOUNTER — Ambulatory Visit: Payer: Self-pay | Admitting: Obstetrics and Gynecology

## 2020-05-28 ENCOUNTER — Other Ambulatory Visit: Payer: Self-pay | Admitting: Unknown Physician Specialty

## 2020-05-28 ENCOUNTER — Ambulatory Visit (INDEPENDENT_AMBULATORY_CARE_PROVIDER_SITE_OTHER): Payer: 59 | Admitting: Obstetrics and Gynecology

## 2020-05-28 ENCOUNTER — Other Ambulatory Visit (HOSPITAL_COMMUNITY): Payer: Self-pay | Admitting: Unknown Physician Specialty

## 2020-05-28 ENCOUNTER — Encounter: Payer: Self-pay | Admitting: Obstetrics and Gynecology

## 2020-05-28 ENCOUNTER — Other Ambulatory Visit: Payer: Self-pay

## 2020-05-28 VITALS — BP 110/80 | Ht 66.0 in | Wt 142.0 lb

## 2020-05-28 DIAGNOSIS — Z23 Encounter for immunization: Secondary | ICD-10-CM | POA: Diagnosis not present

## 2020-05-28 DIAGNOSIS — B3731 Acute candidiasis of vulva and vagina: Secondary | ICD-10-CM

## 2020-05-28 DIAGNOSIS — H9041 Sensorineural hearing loss, unilateral, right ear, with unrestricted hearing on the contralateral side: Secondary | ICD-10-CM

## 2020-05-28 DIAGNOSIS — Z01419 Encounter for gynecological examination (general) (routine) without abnormal findings: Secondary | ICD-10-CM | POA: Diagnosis not present

## 2020-05-28 DIAGNOSIS — B373 Candidiasis of vulva and vagina: Secondary | ICD-10-CM

## 2020-05-28 DIAGNOSIS — Z1231 Encounter for screening mammogram for malignant neoplasm of breast: Secondary | ICD-10-CM

## 2020-05-28 NOTE — Progress Notes (Signed)
PCP:  System, Provider Not In   Chief Complaint  Patient presents with  . Gynecologic Exam  . Injections    flu     HPI:      Ms. Jacqueline Howell is a 44 y.o. [id number] who LMP was Patient's last menstrual period was 05/21/2020 (exact date)., presents today for her annual examination.  Her menses are regular every 28-30 days, lasting 3-5 days, med to heavy flow.  Dysmenorrhea none (used to be moderate).  She does not have intermenstrual bleeding.  S/p anterior colporrhaphy and sling for urinary incontinence 9/20 with Dr. Tiburcio Pea. Sx improved.  Feels she gets frequent yeast infections now, every few months. Treats with monistat-1 ovule with sx relief. Has itching/pain, d/c. Uses scented soap and dryer sheets. Hasn't had eval, just treats on her own. No sx today.  Sex activity: single partner, contraception - vasectomy.   Last Pap: 11/15/17  Results were: no abnormalities /neg HPV DNA  Hx of STDs: none  Last mammogram: 04/04/19  Results were: normal--routine follow-up in 12 months There is no FH of breast cancer. There is no FH of ovarian cancer. The patient does do self-breast exams. There is a FH of colon cancer in her dad at age 12.    Colonoscopy done about 2 yrs ago for IBS sx--constipation/FH. Unsure when repeat due.   Tobacco use: The patient denies current or previous tobacco use. Alcohol use: none No drug use.  Exercise: moderately active  She does get adequate calcium but not Vitamin D in her diet.  Labs with PCP.    Past Medical History:  Diagnosis Date  . Depression   . IBS (irritable bowel syndrome) 03/2018  . Meniere disease   . Vitamin D deficiency 05/2016    Past Surgical History:  Procedure Laterality Date  . BLADDER SUSPENSION N/A 03/29/2019   Procedure: TRANSVAGINAL TAPE (TVT) PROCEDURE;  Surgeon: Nadara Mustard, MD;  Location: [facility] ORS;  Service: Gynecology;  Laterality: N/A;  . COLONOSCOPY  04/24/2018   repeat due in 5 yrs; Dr. Marland Mcalpine  . CYSTOCELE  REPAIR N/A 03/29/2019   Procedure: ANTERIOR REPAIR (CYSTOCELE);  Surgeon: Nadara Mustard, MD;  Location: [facility] ORS;  Service: Gynecology;  Laterality: N/A;  . CYSTOSCOPY N/A 03/29/2019   Procedure: CYSTOSCOPY;  Surgeon: Nadara Mustard, MD;  Location: [facility] ORS;  Service: Gynecology;  Laterality: N/A;  . DILATION AND CURETTAGE OF UTERUS      Family History  Problem Relation Age of Onset  . Thyroid disease Mother   . Meniere's disease Father   . Colon cancer Father 55  . Breast cancer Neg Hx     Social History   Socioeconomic History  . Marital status: Married    Spouse name: Not on file  . Number of children: Not on file  . Years of education: Not on file  . Highest education level: Not on file  Occupational History  . Not on file  Tobacco Use  . Smoking status: Never Smoker  . Smokeless tobacco: Never Used  Vaping Use  . Vaping Use: Never used  Substance and Sexual Activity  . Alcohol use: No  . Drug use: No  . Sexual activity: Yes    Birth control/protection: Surgical    Comment: Vasectomy  Other Topics Concern  . Not on file  Social History Narrative  . Not on file   Social Determinants of Health   Financial Resource Strain:   . Difficulty of Paying Living Expenses: Not  on file  Food Insecurity:   . Worried About Programme researcher, broadcasting/film/video in the Last Year: Not on file  . Ran Out of Food in the Last Year: Not on file  Transportation Needs:   . Lack of Transportation (Medical): Not on file  . Lack of Transportation (Non-Medical): Not on file  Physical Activity:   . Days of Exercise per Week: Not on file  . Minutes of Exercise per Session: Not on file  Stress:   . Feeling of Stress : Not on file  Social Connections:   . Frequency of Communication with Friends and Family: Not on file  . Frequency of Social Gatherings with Friends and Family: Not on file  . Attends Religious Services: Not on file  . Active Member of Clubs or Organizations: Not on file  . Attends Occupational hygienist Meetings: Not on file  . Marital Status: Not on file  Intimate Partner Violence:   . Fear of Current or Ex-Partner: Not on file  . Emotionally Abused: Not on file  . Physically Abused: Not on file  . Sexually Abused: Not on file    Outpatient Medications Prior to Visit  Medication Sig Dispense Refill  . amphetamine-dextroamphetamine (ADDERALL XR) 20 MG 24 hr capsule Take by mouth.    Marland Kitchen amphetamine-dextroamphetamine (ADDERALL) 10 MG tablet Take 10 mg by mouth daily as needed (FOCUS (IN THE AFTERNOON)).     Marland Kitchen amphetamine-dextroamphetamine (ADDERALL) 10 MG tablet Adderall 10 mg tablet  Take 1 tablet every day by oral route.    . cetirizine (ZYRTEC) 10 MG tablet Take 10 mg by mouth daily.    Marland Kitchen linaclotide (LINZESS) 145 MCG CAPS capsule Take 145 mcg by mouth every evening.     Marland Kitchen LORazepam (ATIVAN) 1 MG tablet Take 1 mg by mouth daily as needed for anxiety.     . Multiple Vitamin (MULTIVITAMIN WITH MINERALS) TABS tablet Take 1 tablet by mouth daily. Women's One-A-Day    . NON FORMULARY Inject 1 Syringe into the skin 2 (two) times a week. ALLERGY SHOTS    . sertraline (ZOLOFT) 100 MG tablet Take 150 mg by mouth daily.    . traZODone (DESYREL) 50 MG tablet Take 1 tablet (50 mg total) by mouth at bedtime. (Patient taking differently: Take 50 mg by mouth at bedtime as needed for sleep. ) 90 tablet 3  . triamterene-hydrochlorothiazide (MAXZIDE-25) 37.5-25 MG tablet Take 1 tablet by mouth daily.    . VENTOLIN HFA 108 (90 Base) MCG/ACT inhaler Inhale 2 puffs into the lungs every 6 (six) hours as needed.    . ADDERALL XR 20 MG 24 hr capsule Take 20 mg by mouth daily.    . hydrOXYzine (ATARAX/VISTARIL) 25 MG tablet Take 25 mg by mouth 2 (two) times daily.     No facility-administered medications prior to visit.      ROS:  Review of Systems  Constitutional: Positive for fatigue. Negative for fever and unexpected weight change.  Respiratory: Negative for cough, shortness of breath  and wheezing.   Cardiovascular: Negative for chest pain, palpitations and leg swelling.  Gastrointestinal: Positive for constipation. Negative for blood in stool, diarrhea, nausea and vomiting.  Endocrine: Negative for cold intolerance, heat intolerance and polyuria.  Genitourinary: Negative for dyspareunia, dysuria, flank pain, frequency, genital sores, hematuria, menstrual problem, pelvic pain, urgency, vaginal bleeding, vaginal discharge and vaginal pain.  Musculoskeletal: Negative for back pain, joint swelling and myalgias.  Skin: Negative for rash.  Neurological: Positive for dizziness  and headaches. Negative for syncope, light-headedness and numbness.  Hematological: Negative for adenopathy.  Psychiatric/Behavioral: Positive for agitation and dysphoric mood. Negative for confusion, sleep disturbance and suicidal ideas. The patient is not nervous/anxious.   BREAST: No symptoms   Objective: BP 110/80   Ht 5\' 6"  (1.676 m)   Wt 142 lb (64.4 kg)   LMP 05/21/2020 (Exact Date)   BMI 22.92 kg/m    Physical Exam Constitutional:      Appearance: She is well-developed.  Genitourinary:     Vulva, vagina, uterus, right adnexa and left adnexa normal.     No vulval lesion or tenderness noted.     No vaginal discharge, erythema or tenderness.     No cervical motion tenderness or polyp.     Uterus is not enlarged or tender.     No right or left adnexal mass present.     Right adnexa not tender.     Left adnexa not tender.  Neck:     Thyroid: No thyromegaly.  Cardiovascular:     Rate and Rhythm: Normal rate and regular rhythm.     Heart sounds: Normal heart sounds. No murmur heard.   Pulmonary:     Effort: Pulmonary effort is normal.     Breath sounds: Normal breath sounds.  Chest:     Breasts:        Right: No mass, nipple discharge, skin change or tenderness.        Left: No mass, nipple discharge, skin change or tenderness.  Abdominal:     Palpations: Abdomen is soft.      Tenderness: There is no abdominal tenderness. There is no guarding.  Musculoskeletal:        General: Normal range of motion.     Cervical back: Normal range of motion.  Neurological:     General: No focal deficit present.     Mental Status: She is alert and oriented to person, place, and time.     Cranial Nerves: No cranial nerve deficit.  Skin:    General: Skin is warm and dry.  Psychiatric:        Mood and Affect: Mood normal.        Behavior: Behavior normal.        Thought Content: Thought content normal.        Judgment: Judgment normal.  Vitals reviewed.     Assessment/Plan: Encounter for annual routine gynecological examination  Encounter for screening mammogram for malignant neoplasm of breast - Plan: MM 3D SCREEN BREAST BILATERAL; pt to sched mammo  Yeast vaginitis--no sx today. RTO with sx for eval. Dove sens skin soap/line dry underwear. Discussed chem derm vs yeast. F/u prn.   Need for immunization against influenza - Plan: Flu Vaccine QUAD 36+ mos IM            GYN counsel breast self exam, mammography screening, adequate intake of calcium and vitamin D, diet and exercise     F/U  Return in about 1 year (around 05/28/2021).  Ilhan Madan B. Peightyn Roberson, PA-C 05/28/2020 2:35 PM

## 2020-05-28 NOTE — Patient Instructions (Signed)
I value your feedback and entrusting us with your care. If you get a [facility] patient survey, I would appreciate you taking the time to let us know about your experience today. Thank you! ° °As of July 05, 2019, your lab results will be released to your MyChart immediately, before I even have a chance to see them. Please give me time to review them and contact you if there are any abnormalities. Thank you for your patience.  ° °[facility]: [phone] ° ° ° °

## 2020-06-12 ENCOUNTER — Other Ambulatory Visit: Payer: Self-pay

## 2020-06-12 ENCOUNTER — Ambulatory Visit
Admission: RE | Admit: 2020-06-12 | Discharge: 2020-06-12 | Disposition: A | Discharge status: Home / Self Care | Payer: 59 | Source: Ambulatory Visit | Attending: Unknown Physician Specialty | Admitting: Unknown Physician Specialty

## 2020-06-12 DIAGNOSIS — H9041 Sensorineural hearing loss, unilateral, right ear, with unrestricted hearing on the contralateral side: Secondary | ICD-10-CM

## 2020-06-12 MED ORDER — GADOBUTROL 1 MMOL/ML IV SOLN
6.0000 mL | Freq: Once | INTRAVENOUS | Status: AC | PRN
  Administered 2020-06-12: 6 mL via INTRAVENOUS

## 2020-06-23 ENCOUNTER — Other Ambulatory Visit: Payer: Self-pay

## 2020-06-23 ENCOUNTER — Ambulatory Visit (INDEPENDENT_AMBULATORY_CARE_PROVIDER_SITE_OTHER): Payer: 59

## 2020-06-23 DIAGNOSIS — R350 Frequency of micturition: Secondary | ICD-10-CM

## 2020-06-23 LAB — POCT URINALYSIS DIPSTICK
Appearance: NORMAL
Bilirubin, UA: NEGATIVE
Blood, UA: NEGATIVE
Glucose, UA: NEGATIVE
Ketones, UA: NEGATIVE
Nitrite, UA: NEGATIVE
Odor: NORMAL
Protein, UA: NEGATIVE
Spec Grav, UA: 1.01 (ref 1.010–1.025)
Urobilinogen, UA: 0.2 E.U./dL
pH, UA: 5 (ref 5.0–8.0)

## 2020-06-23 NOTE — Progress Notes (Signed)
Pls send for C&S. If you put in order, I'll co-sign. Thx.

## 2020-06-23 NOTE — Progress Notes (Signed)
Patient presents today with urinary symptoms odor and frequency. Urinalysis performed and results recorded and reported to [employer], PAC. Culture ordered and sent per [facility] request.

## 2020-06-25 ENCOUNTER — Other Ambulatory Visit: Payer: Self-pay

## 2020-06-25 ENCOUNTER — Ambulatory Visit (INDEPENDENT_AMBULATORY_CARE_PROVIDER_SITE_OTHER): Payer: 59 | Admitting: Obstetrics and Gynecology

## 2020-06-25 ENCOUNTER — Encounter: Payer: Self-pay | Admitting: Obstetrics and Gynecology

## 2020-06-25 VITALS — BP 100/76 | Ht 66.0 in | Wt 142.0 lb

## 2020-06-25 DIAGNOSIS — N760 Acute vaginitis: Secondary | ICD-10-CM | POA: Diagnosis not present

## 2020-06-25 DIAGNOSIS — B9689 Other specified bacterial agents as the cause of diseases classified elsewhere: Secondary | ICD-10-CM

## 2020-06-25 DIAGNOSIS — N923 Ovulation bleeding: Secondary | ICD-10-CM | POA: Diagnosis not present

## 2020-06-25 LAB — URINE CULTURE: Organism ID, Bacteria: NO GROWTH

## 2020-06-25 LAB — POCT WET PREP WITH KOH
Clue Cells Wet Prep HPF POC: POSITIVE
KOH Prep POC: POSITIVE — AB
Trichomonas, UA: NEGATIVE
Yeast Wet Prep HPF POC: NEGATIVE

## 2020-06-25 MED ORDER — METRONIDAZOLE 500 MG PO TABS: 500.0000 mg | ORAL_TABLET | Freq: Two times a day (BID) | ORAL | 0 refills | Status: AC

## 2020-06-25 MED ORDER — FLUCONAZOLE 150 MG PO TABS: 150.0000 mg | ORAL_TABLET | Freq: Once | ORAL | 0 refills | Status: DC

## 2020-06-25 NOTE — Progress Notes (Signed)
Danella Penton, MD   Chief Complaint  Patient presents with   Vaginal Discharge    fishy/sour odor, no itchiness or irritation x on/off since May    HPI:      Ms. Jacqueline Howell is a 44 y.o. [id number] whose LMP was Patient's last menstrual period was 06/04/2020 (approximate)., presents today for brown d/c with a horrible odor after sex with husband last wk. Still has bad odor, no vaginal itching/irritation. Menses are monthly, last 3-5 days, occas BTB. No LBP, pelvic pain, fevers. No meds to treat. UA and C&S done last wk due to odor; neg C&S came back yesterday. Hx of yeast vag sx in past, while using scented soap and dryer sheets. Those sx improved.   Past Medical History:  Diagnosis Date   Depression    IBS (irritable bowel syndrome) 03/2018   Meniere disease    Vitamin D deficiency 05/2016    Past Surgical History:  Procedure Laterality Date   BLADDER SUSPENSION N/A 03/29/2019   Procedure: TRANSVAGINAL TAPE (TVT) PROCEDURE;  Surgeon: Nadara Mustard, MD;  Location: [facility] ORS;  Service: Gynecology;  Laterality: N/A;   COLONOSCOPY  04/24/2018   repeat due in 5 yrs; Dr. Dorena Dew REPAIR N/A 03/29/2019   Procedure: ANTERIOR REPAIR (CYSTOCELE);  Surgeon: Nadara Mustard, MD;  Location: [facility] OR[facility];  Service: Gynecology;  Laterality: N/A;   CYSTOSCOPY N/A 03/29/2019   Procedure: CYSTOSCOPY;  Surgeon: Nadara Mustard, MD;  Location: [facility] ORS;  Service: Gynecology;  Laterality: N/A;   DILATION AND CURETTAGE OF UTERUS      Family History  Problem Relation Age of Onset   Thyroid disease Mother    Meniere's disease Father    Colon cancer Father 75   Breast cancer Neg Hx     Social History   Socioeconomic History   Marital status: Married    Spouse name: Not on file   Number of children: Not on file   Years of education: Not on file   Highest education level: Not on file  Occupational History   Not on file  Tobacco Use   Smoking status: Never  Smoker   Smokeless tobacco: Never Used  Vaping Use   Vaping Use: Never used  Substance and Sexual Activity   Alcohol use: No   Drug use: No   Sexual activity: Yes    Birth control/protection: Surgical    Comment: Vasectomy  Other Topics Concern   Not on file  Social History Narrative   Not on file   Social Determinants of Health   Financial Resource Strain:    Difficulty of Paying Living Expenses: Not on file  Food Insecurity:    Worried About Programme researcher, broadcasting/film/video in the Last Year: Not on file   [employer] of Food in the Last Year: Not on file  Transportation Needs:    Lack of Transportation (Medical): Not on file   Lack of Transportation (Non-Medical): Not on file  Physical Activity:    Days of Exercise per Week: Not on file   Minutes of Exercise per Session: Not on file  Stress:    Feeling of Stress : Not on file  Social Connections:    Frequency of Communication with Friends and Family: Not on file   Frequency of Social Gatherings with Friends and Family: Not on file   Attends Religious Services: Not on file   Active Member of Clubs or Organizations: Not on file  Attends Banker Meetings: Not on file   Marital Status: Not on file  Intimate Partner Violence:    Fear of Current or Ex-Partner: Not on file   Emotionally Abused: Not on file   Physically Abused: Not on file   Sexually Abused: Not on file    Outpatient Medications Prior to Visit  Medication Sig Dispense Refill   amphetamine-dextroamphetamine (ADDERALL XR) 20 MG 24 hr capsule Take by mouth.     amphetamine-dextroamphetamine (ADDERALL) 10 MG tablet Take 10 mg by mouth daily as needed (FOCUS (IN THE AFTERNOON)).      amphetamine-dextroamphetamine (ADDERALL) 10 MG tablet Adderall 10 mg tablet  Take 1 tablet every day by oral route.     cetirizine (ZYRTEC) 10 MG tablet Take 10 mg by mouth daily.     linaclotide (LINZESS) 145 MCG CAPS capsule Take 145 mcg by mouth every  evening.      LORazepam (ATIVAN) 1 MG tablet Take 1 mg by mouth daily as needed for anxiety.      Multiple Vitamin (MULTIVITAMIN WITH MINERALS) TABS tablet Take 1 tablet by mouth daily. Women's One-A-Day     NON FORMULARY Inject 1 Syringe into the skin 2 (two) times a week. ALLERGY SHOTS     sertraline (ZOLOFT) 100 MG tablet Take 150 mg by mouth daily.     traZODone (DESYREL) 50 MG tablet Take 1 tablet (50 mg total) by mouth at bedtime. (Patient taking differently: Take 50 mg by mouth at bedtime as needed for sleep. ) 90 tablet 3   triamterene-hydrochlorothiazide (MAXZIDE-25) 37.5-25 MG tablet Take 1 tablet by mouth daily.     VENTOLIN HFA 108 (90 Base) MCG/ACT inhaler Inhale 2 puffs into the lungs every 6 (six) hours as needed.     No facility-administered medications prior to visit.      ROS:  Review of Systems  Constitutional: Negative for fever.  Gastrointestinal: Negative for blood in stool, constipation, diarrhea, nausea and vomiting.  Genitourinary: Positive for vaginal bleeding and vaginal discharge. Negative for dyspareunia, dysuria, flank pain, frequency, hematuria, urgency and vaginal pain.  Musculoskeletal: Negative for back pain.  Skin: Negative for rash.  BREAST: No symptoms   OBJECTIVE:   Vitals:  BP 100/76    Ht 5\' 6"  (1.676 m)    Wt 142 lb (64.4 kg)    LMP 06/04/2020 (Approximate)    BMI 22.92 kg/m   Physical Exam Vitals reviewed.  Constitutional:      Appearance: She is well-developed.  Pulmonary:     Effort: Pulmonary effort is normal.  Genitourinary:    General: Normal vulva.     Pubic Area: No rash.      Labia:        Right: No rash, tenderness or lesion.        Left: No rash, tenderness or lesion.      Vagina: Normal. No vaginal discharge, erythema, tenderness or bleeding.     Cervix: Normal.     Uterus: Normal. Not enlarged and not tender.      Adnexa: Right adnexa normal and left adnexa normal.       Right: No mass or tenderness.          Left: No mass or tenderness.       Comments: NO BROWN D/C TODAY Musculoskeletal:        General: Normal range of motion.     Cervical back: Normal range of motion.  Skin:    General: Skin is warm and  dry.  Neurological:     General: No focal deficit present.     Mental Status: She is alert and oriented to person, place, and time.  Psychiatric:        Mood and Affect: Mood normal.        Behavior: Behavior normal.        Thought Content: Thought content normal.        Judgment: Judgment normal.     Results: Results for orders placed or performed in visit on 06/25/20 (from the past 24 hour(s))  POCT Wet Prep with KOH     Status: Abnormal   Collection Time: 06/25/20  2:40 PM  Result Value Ref Range   Trichomonas, UA Negative    Clue Cells Wet Prep HPF POC pos    Epithelial Wet Prep HPF POC     Yeast Wet Prep HPF POC neg    Bacteria Wet Prep HPF POC     RBC Wet Prep HPF POC     WBC Wet Prep HPF POC     KOH Prep POC Positive (A) Negative     Assessment/Plan: Bacterial vaginosis - Plan: metroNIDAZOLE (FLAGYL) 500 MG tablet, fluconazole (DIFLUCAN) 150 MG tablet, POCT Wet Prep with KOH; pos sx and wet prep. Rx flagyl, no EtOH. Will RF if sx recur. Rx diflucan prn yeast vag sx with abx.  F/u prn.   Spotting between menses--occas midcycle. Will cont to follow.    Meds ordered this encounter  Medications   metroNIDAZOLE (FLAGYL) 500 MG tablet    Sig: Take 1 tablet (500 mg total) by mouth 2 (two) times daily for 7 days.    Dispense:  14 tablet    Refill:  0    Order Specific Question:   Supervising Provider    Answer:   Nadara Mustard [[id number]]   fluconazole (DIFLUCAN) 150 MG tablet    Sig: Take 1 tablet (150 mg total) by mouth once for 1 dose.    Dispense:  1 tablet    Refill:  0    Order Specific Question:   Supervising Provider    Answer:   Nadara Mustard [[id number]]      Return if symptoms worsen or fail to improve.  Jamari Diana B. Breslin Hemann, PA-C 06/25/2020 2:41  PM

## 2020-06-25 NOTE — Patient Instructions (Signed)
I value your feedback and entrusting us with your care. If you get a [facility] patient survey, I would appreciate you taking the time to let us know about your experience today. Thank you!  As of July 05, 2019, your lab results will be released to your MyChart immediately, before I even have a chance to see them. Please give me time to review them and contact you if there are any abnormalities. Thank you for your patience.  

## 2020-07-04 ENCOUNTER — Encounter: Payer: Self-pay | Admitting: Obstetrics and Gynecology

## 2020-07-09 ENCOUNTER — Other Ambulatory Visit: Payer: Self-pay | Admitting: Obstetrics and Gynecology

## 2020-07-09 MED ORDER — CLINDAMYCIN HCL 300 MG PO CAPS: 300.0000 mg | ORAL_CAPSULE | Freq: Two times a day (BID) | ORAL | 0 refills | Status: DC

## 2020-07-09 NOTE — Progress Notes (Signed)
Rx clindamycin for recurrent BV sx shortly after flagyl tx

## 2020-07-29 ENCOUNTER — Other Ambulatory Visit: Payer: Self-pay | Admitting: Obstetrics and Gynecology

## 2020-07-29 DIAGNOSIS — B9689 Other specified bacterial agents as the cause of diseases classified elsewhere: Secondary | ICD-10-CM

## 2020-07-29 DIAGNOSIS — N760 Acute vaginitis: Secondary | ICD-10-CM

## 2020-07-29 MED ORDER — FLUCONAZOLE 150 MG PO TABS: 150.0000 mg | ORAL_TABLET | Freq: Once | ORAL | 0 refills | Status: AC

## 2020-07-29 NOTE — Progress Notes (Signed)
Rx diflucan for yeast vag after BV tx

## 2020-09-29 ENCOUNTER — Other Ambulatory Visit: Payer: Self-pay | Admitting: Obstetrics and Gynecology

## 2020-09-29 ENCOUNTER — Encounter: Payer: Self-pay | Admitting: Obstetrics and Gynecology

## 2020-09-29 MED ORDER — FLUCONAZOLE 150 MG PO TABS: 150.0000 mg | ORAL_TABLET | Freq: Once | ORAL | 0 refills | Status: DC

## 2020-09-29 MED ORDER — CLINDAMYCIN HCL 300 MG PO CAPS: 300.0000 mg | ORAL_CAPSULE | Freq: Two times a day (BID) | ORAL | 0 refills | Status: AC

## 2020-09-29 NOTE — Progress Notes (Signed)
Rx RF clindamycin for BV and yeast prn sx

## 2020-10-06 ENCOUNTER — Other Ambulatory Visit: Payer: Self-pay | Admitting: Obstetrics and Gynecology

## 2020-10-06 MED ORDER — FLUCONAZOLE 150 MG PO TABS
150.0000 mg | ORAL_TABLET | Freq: Once | ORAL | 0 refills | Status: AC
Start: 2020-10-06 — End: 2020-10-06

## 2020-10-06 NOTE — Progress Notes (Signed)
Rx RF diflucan, dog ate Rx

## 2021-03-10 ENCOUNTER — Ambulatory Visit
Admission: EM | Admit: 2021-03-10 | Discharge: 2021-03-10 | Disposition: A | Discharge status: Home / Self Care | Payer: 59 | Attending: Internal Medicine | Admitting: Internal Medicine

## 2021-03-10 ENCOUNTER — Other Ambulatory Visit: Payer: Self-pay

## 2021-03-10 DIAGNOSIS — T23222A Burn of second degree of single left finger (nail) except thumb, initial encounter: Secondary | ICD-10-CM | POA: Diagnosis not present

## 2021-03-10 MED ORDER — HYDROCODONE-ACETAMINOPHEN 5-325 MG PO TABS: 1.0000 | ORAL_TABLET | Freq: Four times a day (QID) | ORAL | 0 refills | Status: DC | PRN

## 2021-03-10 MED ORDER — SILVER SULFADIAZINE 1 % EX CREA: 1.0000 "application " | TOPICAL_CREAM | Freq: Every day | CUTANEOUS | 0 refills | Status: DC

## 2021-03-10 NOTE — Discharge Instructions (Addendum)
Follow up with your primary care doctor in 5-7 days  Only wash wound with soap and water

## 2021-03-10 NOTE — ED Triage Notes (Signed)
Pt sts she was using a hot glue gun and burned her R pinky on Sunday. Sts she have been using abx ointment on finger. Finger is painful.

## 2021-03-10 NOTE — ED Provider Notes (Signed)
[facility]    CSN: [id number] Arrival date & time: 03/10/21  1204      History   Chief Complaint Chief Complaint  Patient presents with   Burn    Burn to R pinky finger    HPI Jacqueline Howell is a 45 y.o. female who presents with burn R small finger x 2 days ago. She was using hot glue and she tried to remove it rapidly as soon as this happened in her reaction, and peeled the skin off. Has been applying antibiotic ointment, but finger is still painful and pain radiates to her lateral hand.  Tylenol has not helped her pain.     Past Medical History:  Diagnosis Date   Depression    IBS (irritable bowel syndrome) 03/2018   Meniere disease    Vitamin D deficiency 05/2016    Patient Active Problem List   Diagnosis Date Noted   Bacterial vaginosis 06/25/2020   Urinary incontinence 03/29/2019   Chronic idiopathic constipation 11/15/2017   Family history of colon cancer 11/15/2017   Intractable menstrual migraine without status migrainosus 11/15/2017   Dysmenorrhea 11/15/2017   Chronic venous insufficiency 12/29/2016   Varicose veins of right lower extremity 12/29/2016   Depression 12/21/2016   Low back pain 06/03/2015   ADHD (attention deficit hyperactivity disorder) 01/13/2015   Chronic anxiety 01/13/2015    Past Surgical History:  Procedure Laterality Date   BLADDER SUSPENSION N/A 03/29/2019   Procedure: TRANSVAGINAL TAPE (TVT) PROCEDURE;  Surgeon: Nadara Mustard, MD;  Location: [facility];  Service: Gynecology;  Laterality: N/A;   COLONOSCOPY  04/24/2018   repeat due in 5 yrs; Dr. Dorena Dew REPAIR N/A 03/29/2019   Procedure: ANTERIOR REPAIR (CYSTOCELE);  Surgeon: Nadara Mustard, MD;  Location: [facility];  Service: Gynecology;  Laterality: N/A;   CYSTOSCOPY N/A 03/29/2019   Procedure: CYSTOSCOPY;  Surgeon: Nadara Mustard, MD;  Location: [facility];  Service: Gynecology;  Laterality: N/A;   DILATION AND CURETTAGE OF UTERUS      OB History      Gravida  6   Para  4   Term  4   Preterm      AB  2   Living  4      SAB  2   IAB      Ectopic      Multiple      Live Births               Home Medications    Prior to Admission medications   Medication Sig Start Date End Date Taking? Authorizing Provider  HYDROcodone-acetaminophen (NORCO/VICODIN) 5-325 MG tablet Take 1 tablet by mouth every 6 (six) hours as needed. 03/10/21  Yes Rodriguez-Southworth, Nettie Elm, PA-C  silver sulfADIAZINE (SILVADENE) 1 % cream Apply 1 application topically daily. For 7 days 03/10/21  Yes Rodriguez-Southworth, Viviana Simpler  amphetamine-dextroamphetamine (ADDERALL XR) 20 MG 24 hr capsule Take by mouth. 03/17/19   [provider]  amphetamine-dextroamphetamine (ADDERALL) 10 MG tablet Take 10 mg by mouth daily as needed (FOCUS (IN THE AFTERNOON)).  03/17/19   [provider]  amphetamine-dextroamphetamine (ADDERALL) 10 MG tablet Adderall 10 mg tablet  Take 1 tablet every day by oral route. 03/17/19   [provider]  cetirizine (ZYRTEC) 10 MG tablet Take 10 mg by mouth daily.    [provider]  linaclotide Karlene Einstein) 145 MCG CAPS capsule Take 145 mcg by mouth every evening.  04/17/18   [provider]  LORazepam (ATIVAN) 1 MG tablet Take 1 mg by mouth daily as needed for anxiety.  03/01/19   [provider]  Multiple Vitamin (MULTIVITAMIN WITH MINERALS) TABS tablet Take 1 tablet by mouth daily. Women's One-A-Day    [provider]  NON FORMULARY Inject 1 Syringe into the skin 2 (two) times a week. ALLERGY SHOTS    [provider]  sertraline (ZOLOFT) 100 MG tablet Take 150 mg by mouth daily. 03/05/19   [provider]  traZODone (DESYREL) 50 MG tablet Take 1 tablet (50 mg total) by mouth at bedtime. Patient taking differently: Take 50 mg by mouth at bedtime as needed for sleep.  09/16/17   Steele Sizer, MD  triamterene-hydrochlorothiazide (MAXZIDE-25) 37.5-25 MG tablet  Take 1 tablet by mouth daily. 05/14/20   [provider]  VENTOLIN HFA 108 (90 Base) MCG/ACT inhaler Inhale 2 puffs into the lungs every 6 (six) hours as needed. 03/08/19   [provider]    Family History Family History  Problem Relation Age of Onset   Thyroid disease Mother    Meniere's disease Father    Colon cancer Father 68   Breast cancer Neg Hx     Social History Social History   Tobacco Use   Smoking status: Never   Smokeless tobacco: Never  Vaping Use   Vaping Use: Never used  Substance Use Topics   Alcohol use: No   Drug use: No     Allergies   Biaxin [clarithromycin]   Review of Systems Review of Systems  Skin:  Positive for wound.       Mild redness    Physical Exam Triage Vital Signs ED Triage Vitals [03/10/21 1208]  Enc Vitals Group     BP 123/81     Pulse Rate 79     Resp 18     Temp 98.2 F (36.8 C)     Temp Source Oral     SpO2 98 %     Weight 142 lb (64.4 kg)     Height 5\' 6"  (1.676 m)     Head Circumference      Peak Flow      Pain Score 8     Pain Loc      Pain Edu?      Excl. in GC?    No data found.  Updated Vital Signs BP 123/81   Pulse 79   Temp 98.2 F (36.8 C) (Oral)   Resp 18   Ht 5\' 6"  (1.676 m)   Wt 142 lb (64.4 kg)   SpO2 98%   BMI 22.92 kg/m   Visual Acuity Right Eye Distance:   Left Eye Distance:   Bilateral Distance:    Right Eye Near:   Left Eye Near:    Bilateral Near:     Physical Exam Vitals reviewed.  Constitutional:      Appearance: She is normal weight.  HENT:     Head: Normocephalic.     Right Ear: External ear normal.     Left Ear: External ear normal.  Eyes:     Conjunctiva/sclera: Conjunctivae normal.  Pulmonary:     Effort: Pulmonary effort is normal.  Musculoskeletal:     Cervical back: Neck supple.  Skin:    General: Skin is warm and dry.     Capillary Refill: Capillary refill takes less than 2 seconds.     Comments: R SMALL FINGER- with 1/2 x 3/4 cm  healing  wound with no dermis present. Is dry, but has mild swelling warmth and erythema around it and extending to proximal dorsal finger area.   Neurological:     Mental Status: She is alert and oriented to person, place, and time.     Sensory: No sensory deficit.     Gait: Gait normal.  Psychiatric:        Mood and Affect: Mood normal.        Thought Content: Thought content normal.        Judgment: Judgment normal.     UC Treatments / Results  Labs (all labs ordered are listed, but only abnormal results are displayed) Labs Reviewed - No data to display  EKG   Radiology No results found.  Procedures Procedures (including critical care time)  Medications Ordered in UC Medications - No data to display  Initial Impression / Assessment and Plan / UC Course  I have reviewed the triage vital signs and the nursing notes.  Has 2nd degree burn R small finger with infection. Wound was cleansed, bacitracin ointment applied and dressing.  I placed her on Doxy as noted. See instructions.  Final Clinical Impressions(s) / UC Diagnoses   Final diagnoses:  Partial thickness burn of finger of left hand, initial encounter     Discharge Instructions      Follow up with your primary care doctor in 5-7 days  Only wash wound with soap and water      ED Prescriptions     Medication Sig Dispense Auth. Provider   silver sulfADIAZINE (SILVADENE) 1 % cream Apply 1 application topically daily. For 7 days 50 g Rodriguez-Southworth, Nettie Elm, PA-C   HYDROcodone-acetaminophen (NORCO/VICODIN) 5-325 MG tablet Take 1 tablet by mouth every 6 (six) hours as needed. 10 tablet Rodriguez-Southworth, Nettie Elm, PA-C      I have reviewed the PDMP during this encounter.   Garey Ham, PA-C 03/10/21 1249

## 2021-03-24 HISTORY — PX: METATARSAL OSTEOTOMY WITH BUNIONECTOMY: SHX5662

## 2021-07-30 DIAGNOSIS — J301 Allergic rhinitis due to pollen: Secondary | ICD-10-CM | POA: Diagnosis not present

## 2021-08-06 DIAGNOSIS — M79671 Pain in right foot: Secondary | ICD-10-CM | POA: Diagnosis not present

## 2021-08-06 DIAGNOSIS — J301 Allergic rhinitis due to pollen: Secondary | ICD-10-CM | POA: Diagnosis not present

## 2021-08-06 DIAGNOSIS — M7741 Metatarsalgia, right foot: Secondary | ICD-10-CM | POA: Diagnosis not present

## 2021-08-06 DIAGNOSIS — G5761 Lesion of plantar nerve, right lower limb: Secondary | ICD-10-CM | POA: Diagnosis not present

## 2021-08-06 DIAGNOSIS — M21621 Bunionette of right foot: Secondary | ICD-10-CM | POA: Diagnosis not present

## 2021-08-06 DIAGNOSIS — M2011 Hallux valgus (acquired), right foot: Secondary | ICD-10-CM | POA: Diagnosis not present

## 2021-08-13 DIAGNOSIS — J301 Allergic rhinitis due to pollen: Secondary | ICD-10-CM | POA: Diagnosis not present

## 2021-08-20 DIAGNOSIS — J301 Allergic rhinitis due to pollen: Secondary | ICD-10-CM | POA: Diagnosis not present

## 2021-08-27 DIAGNOSIS — J301 Allergic rhinitis due to pollen: Secondary | ICD-10-CM | POA: Diagnosis not present

## 2021-09-03 DIAGNOSIS — J301 Allergic rhinitis due to pollen: Secondary | ICD-10-CM | POA: Diagnosis not present

## 2021-09-10 DIAGNOSIS — J301 Allergic rhinitis due to pollen: Secondary | ICD-10-CM | POA: Diagnosis not present

## 2021-09-17 DIAGNOSIS — J301 Allergic rhinitis due to pollen: Secondary | ICD-10-CM | POA: Diagnosis not present

## 2021-09-21 DIAGNOSIS — F411 Generalized anxiety disorder: Secondary | ICD-10-CM | POA: Diagnosis not present

## 2021-09-21 DIAGNOSIS — F900 Attention-deficit hyperactivity disorder, predominantly inattentive type: Secondary | ICD-10-CM | POA: Diagnosis not present

## 2021-09-21 DIAGNOSIS — G4700 Insomnia, unspecified: Secondary | ICD-10-CM | POA: Diagnosis not present

## 2021-09-21 DIAGNOSIS — R69 Illness, unspecified: Secondary | ICD-10-CM | POA: Diagnosis not present

## 2021-09-24 ENCOUNTER — Ambulatory Visit: Payer: 59 | Admitting: Podiatry

## 2021-09-24 ENCOUNTER — Encounter: Payer: Self-pay | Admitting: Podiatry

## 2021-09-24 ENCOUNTER — Ambulatory Visit (INDEPENDENT_AMBULATORY_CARE_PROVIDER_SITE_OTHER): Payer: 59

## 2021-09-24 ENCOUNTER — Telehealth: Payer: Self-pay

## 2021-09-24 ENCOUNTER — Other Ambulatory Visit: Payer: Self-pay

## 2021-09-24 DIAGNOSIS — M21612 Bunion of left foot: Secondary | ICD-10-CM

## 2021-09-24 DIAGNOSIS — M21611 Bunion of right foot: Secondary | ICD-10-CM

## 2021-09-24 DIAGNOSIS — J301 Allergic rhinitis due to pollen: Secondary | ICD-10-CM | POA: Diagnosis not present

## 2021-09-24 DIAGNOSIS — M21619 Bunion of unspecified foot: Secondary | ICD-10-CM | POA: Diagnosis not present

## 2021-09-24 NOTE — Telephone Encounter (Signed)
DOS 10/06/2021 ? ?REMOVAL FIXATION X2 RT - 28108 ?EXOSTECTOMY 1ST RT - 28108 ?INJECTION RT - 20550 ? ?[employer] EFFECTIVE DATE - 07/26/2021 ? ?PLAN DEDUCTIBLE - $0.00 ?OUT OF POCKET - $1700.00 W/ $1610.42 REMAINING ?COPAY $0.00 ?COINSURANCE - 0% ? ?SPOKE TO PADDY AT [facility] REF # [id number] ? ?NO AUTH REQUIRED FOR CPT 20680, [id number] OR 81017 ? ?

## 2021-09-25 NOTE — Progress Notes (Signed)
Subjective:  ? ?Patient ID: Jacqueline Howell, female   DOB: 46 y.o.   MRN: [id number]  ? ?HPI ?Patient presents stating that she had bunion surgery done at [facility] in August and that she has had a lot of problems since and states that she feels the fixation on the side of her first metatarsal and she does have some swelling and quite a bit of pain in the outside of her foot and is worried that her tendons are not functioning on top of her foot.  She does not smoke likes to be active ? ? ?Review of Systems  ?All other systems reviewed and are negative. ? ? ?   ?Objective:  ?Physical Exam ?Vitals and nursing note reviewed.  ?Constitutional:   ?   Appearance: She is well-developed.  ?Pulmonary:  ?   Effort: Pulmonary effort is normal.  ?Musculoskeletal:     ?   General: Normal range of motion.  ?Skin: ?   General: Skin is warm.  ?Neurological:  ?   Mental Status: She is alert.  ?  ?Neurovascular status is found to be intact muscle strength is found to be adequate subtalar midtarsal joint but it appears the extensor EHL and possibly EDL or not functioning well but she was initiated for 6 months after surgery.  She also has quite a bit of discomfort on the medial side of the foot where it appears there is fixation that is irritating her and she has inflammation of the peroneal base fifth metatarsal right which could be compensation.  Moderate deformity of the left nowhere near to the same degree and good digital perfusion well oriented x3 ? ?   ?Assessment:  ?Appears to be an issue of fixation right with possible bone spur on the medial side of the first metatarsal shaft where the bone was moved over.  I also am concerned about possible tear of the EHL tendon and possible dysfunction of the EDL even though I am more concerned about EHL and peroneal tendinitis and the possibility that there may be avascular necrosis of the first metatarsal head with lifting of the first metatarsal head reduced motion that is currently not  painful ? ?   ?Plan:  ?Reviewed all conditions with her and several physicians.  I am concerned long-term about the health of the first metatarsal head but the first objective will be to remove fixation and smooth the bone spur on the medial side first metatarsal see results and then decide what else may be appropriate including probable MRI.  Today I went ahead and I scheduled her for removal of 2 screws in 2 separate locations first metatarsal and right hallux and smoothing of bone spur on the medial side of the right first metatarsal and possible injection of the peroneal base fifth metatarsal right.  I advised her on this and also it may require more work in the future with possible MRI and possible trying to repair the extensor hallucis longus tendon ? ?X-rays indicate that there is elevation of the first metatarsal head right from previous surgery I do not think there is a problem at the site where the bone is healing but I am concerned about possible avascular necrosis also and fixation with 2 screws noted that will need to be removed.  Left foot shows mild deformity nowhere near to the same degree ?   ? ? ?

## 2021-10-01 ENCOUNTER — Encounter: Payer: Self-pay | Admitting: Podiatry

## 2021-10-01 DIAGNOSIS — J301 Allergic rhinitis due to pollen: Secondary | ICD-10-CM | POA: Diagnosis not present

## 2021-10-02 ENCOUNTER — Encounter: Payer: Self-pay | Admitting: Podiatry

## 2021-10-02 ENCOUNTER — Other Ambulatory Visit: Payer: Self-pay

## 2021-10-02 ENCOUNTER — Ambulatory Visit: Payer: 59 | Admitting: Podiatry

## 2021-10-02 DIAGNOSIS — M21619 Bunion of unspecified foot: Secondary | ICD-10-CM | POA: Diagnosis not present

## 2021-10-02 DIAGNOSIS — Z472 Encounter for removal of internal fixation device: Secondary | ICD-10-CM

## 2021-10-02 DIAGNOSIS — M7751 Other enthesopathy of right foot: Secondary | ICD-10-CM

## 2021-10-02 NOTE — Telephone Encounter (Signed)
Can someone please scheduler her? Thanks! ?

## 2021-10-02 NOTE — Telephone Encounter (Signed)
Patient coming in at 1015am 10/02/21 - ok per Marchelle Folks to use slot .

## 2021-10-04 NOTE — Progress Notes (Signed)
Subjective:  ? ?Patient ID: Jacqueline Howell, female   DOB: 46 y.o.   MRN: [id number]  ? ?HPI ?Patient presents stating she is concerned about the function of her big toe joint and also has a lot of pain around the fourth metatarsal joint that can be bothersome for her.  She is wondering if we can address this at the same time ? ? ?ROS ? ? ?   ?Objective:  ?Physical Exam  ?Patient who had previous minimally invasive surgery right who has developed an elevation of the first metatarsal head with the possibility for secondary nonunion or possibility avascular necrosis with fixation that bothers her a lot in the first metatarsal shaft and into the right hallux.  Possibility for lateral compensatory symptoms or inflammatory capsulitis ? ?   ?Assessment:  ?Multiple possibilities but the fixation is absolutely the pain that bothers her the most ? ?   ?Plan:  ?I reviewed all conditions and her x-rays and at this point we are going to just go ahead take out her fixation and I will consider a periarticular injection around the fourth MPJ right.  Then once we have had that done if symptoms persist we will need to get an MRI to better understand the first metatarsal head pathology and whether or not future surgery may be necessary.  Advised her explained all this to her she is comfortable with this scheduled for surgery ?   ? ? ?

## 2021-10-05 MED ORDER — HYDROCODONE-ACETAMINOPHEN 10-325 MG PO TABS: 1.0000 | ORAL_TABLET | Freq: Three times a day (TID) | ORAL | 0 refills | Status: AC | PRN

## 2021-10-05 NOTE — Addendum Note (Signed)
Addended by: Lenn Sink on: 10/05/2021 12:46 PM ? ? Modules accepted: Orders ? ?

## 2021-10-06 ENCOUNTER — Encounter: Payer: Self-pay | Admitting: Podiatry

## 2021-10-06 DIAGNOSIS — M778 Other enthesopathies, not elsewhere classified: Secondary | ICD-10-CM | POA: Diagnosis not present

## 2021-10-06 DIAGNOSIS — M7751 Other enthesopathy of right foot: Secondary | ICD-10-CM | POA: Diagnosis not present

## 2021-10-06 DIAGNOSIS — M25774 Osteophyte, right foot: Secondary | ICD-10-CM | POA: Diagnosis not present

## 2021-10-06 DIAGNOSIS — Z4889 Encounter for other specified surgical aftercare: Secondary | ICD-10-CM | POA: Diagnosis not present

## 2021-10-06 DIAGNOSIS — M85871 Other specified disorders of bone density and structure, right ankle and foot: Secondary | ICD-10-CM | POA: Diagnosis not present

## 2021-10-06 DIAGNOSIS — T8489XA Other specified complication of internal orthopedic prosthetic devices, implants and grafts, initial encounter: Secondary | ICD-10-CM | POA: Diagnosis not present

## 2021-10-06 HISTORY — PX: FOOT HARDWARE REMOVAL: SHX1661

## 2021-10-07 ENCOUNTER — Encounter: Payer: Self-pay | Admitting: Podiatry

## 2021-10-08 ENCOUNTER — Other Ambulatory Visit: Payer: Self-pay | Admitting: Podiatry

## 2021-10-08 DIAGNOSIS — J301 Allergic rhinitis due to pollen: Secondary | ICD-10-CM | POA: Diagnosis not present

## 2021-10-08 MED ORDER — OXYCODONE HCL 5 MG PO TABS: 5.0000 mg | ORAL_TABLET | Freq: Four times a day (QID) | ORAL | 0 refills | Status: DC | PRN

## 2021-10-12 ENCOUNTER — Ambulatory Visit (INDEPENDENT_AMBULATORY_CARE_PROVIDER_SITE_OTHER): Payer: 59 | Admitting: Podiatry

## 2021-10-12 ENCOUNTER — Ambulatory Visit (INDEPENDENT_AMBULATORY_CARE_PROVIDER_SITE_OTHER): Payer: 59

## 2021-10-12 ENCOUNTER — Other Ambulatory Visit: Payer: Self-pay

## 2021-10-12 DIAGNOSIS — M7751 Other enthesopathy of right foot: Secondary | ICD-10-CM

## 2021-10-12 DIAGNOSIS — Z472 Encounter for removal of internal fixation device: Secondary | ICD-10-CM

## 2021-10-12 DIAGNOSIS — Z9889 Other specified postprocedural states: Secondary | ICD-10-CM

## 2021-10-14 NOTE — Progress Notes (Signed)
Subjective: ?Jacqueline Howell is a 46 y.o. is seen today in office s/p right foot HWR preformed on 10/06/2021 with Dr. Charlsie Merles.  States that she has been doing well she is only taking Tylenol rarely for the pain.  She has not been taking narcotics.  She still in a surgical shoe.  She states that she is doing well this has been a very difficult experience compared to last appointment and she really liked the nerve block. ? ?Objective: ?General: No acute distress, AAOx3  ?DP/PT pulses palpable 2/4, CRT < 3 sec to all digits.  ?Protective sensation intact. Motor function intact.  ?Right foot: Incision is well coapted without any evidence of dehiscence with sutures intact. There is no surrounding erythema, ascending cellulitis, fluctuance, crepitus, malodor, drainage/purulence. There is mild edema around the surgical site. There is no significant pain along the surgical site.  ?No other areas of tenderness to bilateral lower extremities.  ?No other open lesions or pre-ulcerative lesions.  ?No pain with calf compression, swelling, warmth, erythema.  ? ?Assessment and Plan:  ?Status post right foot hardware removal, doing well with no complications  ? ?-Treatment options discussed including all alternatives, risks, and complications ?-X-rays obtained reviewed.  No evidence of acute fracture.  Status post hardware removal of the first metatarsal.  Screw still intact at the proximal phalanx. ?-Incisions healing well.  Small amount of antibiotic ointment and a bandage applied.  Discussed that she can wash the foot with soap and water, dry thoroughly and apply a similar bandage. ?-Continue surgical shoe ?-Ice/elevation ?-Pain medication as needed. ?-Monitor for any clinical signs or symptoms of infection and DVT/PE and directed to call the office immediately should any occur or go to the ER. ?-Follow-up as scheduled with Dr. Charlsie Merles or sooner if any problems arise. In the meantime, encouraged to call the office with any questions,  concerns, change in symptoms.  ? ?Ovid Curd, DPM ? ?

## 2021-10-15 DIAGNOSIS — J301 Allergic rhinitis due to pollen: Secondary | ICD-10-CM | POA: Diagnosis not present

## 2021-10-20 ENCOUNTER — Encounter: Payer: Self-pay | Admitting: Podiatry

## 2021-10-22 ENCOUNTER — Ambulatory Visit (INDEPENDENT_AMBULATORY_CARE_PROVIDER_SITE_OTHER): Payer: 59 | Admitting: Podiatry

## 2021-10-22 ENCOUNTER — Ambulatory Visit (INDEPENDENT_AMBULATORY_CARE_PROVIDER_SITE_OTHER): Payer: 59

## 2021-10-22 DIAGNOSIS — M25774 Osteophyte, right foot: Secondary | ICD-10-CM

## 2021-10-22 DIAGNOSIS — J301 Allergic rhinitis due to pollen: Secondary | ICD-10-CM | POA: Diagnosis not present

## 2021-10-22 DIAGNOSIS — Z9889 Other specified postprocedural states: Secondary | ICD-10-CM

## 2021-10-22 MED ORDER — AZITHROMYCIN 250 MG PO TABS: ORAL_TABLET | ORAL | 0 refills | Status: DC

## 2021-10-22 NOTE — Progress Notes (Signed)
Subjective:  ? ?Patient ID: Jacqueline Howell, female   DOB: 46 y.o.   MRN: [id number]  ? ?HPI ?Patient presents stating concerned about some redness stating its not draining anymore but she wanted the incision site checked right ? ? ?ROS ? ? ?   ?Objective:  ?Physical Exam  ?Vascular status intact incision sites healing well has crusted over there are some slight redness around it but localized no proximal edema erythema drainage and nontender ? ?   ?Assessment:  ?Overall doing well post fixation removal spurring that was reduced on the medial side right first metatarsal ? ?   ?Plan:  ?H&P x-ray reviewed precautionary Z-Pak was prescribed and instructed that if any erythema edema or drainage were to occur let us know immediately and we will use a Band-Aid over the area.  Continue open toed shoes for 2 more weeks and reappoint to recheck ? ?X-rays indicate satisfactory removal of fixation and smoothing of bone on the medial side of the first metatarsal ?   ? ? ?

## 2021-10-29 DIAGNOSIS — J301 Allergic rhinitis due to pollen: Secondary | ICD-10-CM | POA: Diagnosis not present

## 2021-11-05 DIAGNOSIS — J301 Allergic rhinitis due to pollen: Secondary | ICD-10-CM | POA: Diagnosis not present

## 2021-11-09 DIAGNOSIS — J301 Allergic rhinitis due to pollen: Secondary | ICD-10-CM | POA: Diagnosis not present

## 2021-11-12 DIAGNOSIS — J301 Allergic rhinitis due to pollen: Secondary | ICD-10-CM | POA: Diagnosis not present

## 2021-11-16 DIAGNOSIS — G4700 Insomnia, unspecified: Secondary | ICD-10-CM | POA: Diagnosis not present

## 2021-11-16 DIAGNOSIS — R69 Illness, unspecified: Secondary | ICD-10-CM | POA: Diagnosis not present

## 2021-11-16 DIAGNOSIS — F900 Attention-deficit hyperactivity disorder, predominantly inattentive type: Secondary | ICD-10-CM | POA: Diagnosis not present

## 2021-11-16 DIAGNOSIS — F411 Generalized anxiety disorder: Secondary | ICD-10-CM | POA: Diagnosis not present

## 2021-11-17 ENCOUNTER — Encounter: Payer: Self-pay | Admitting: Podiatry

## 2021-11-19 ENCOUNTER — Encounter: Payer: Self-pay | Admitting: Podiatry

## 2021-11-19 ENCOUNTER — Ambulatory Visit (INDEPENDENT_AMBULATORY_CARE_PROVIDER_SITE_OTHER): Payer: 59 | Admitting: Podiatry

## 2021-11-19 ENCOUNTER — Ambulatory Visit (INDEPENDENT_AMBULATORY_CARE_PROVIDER_SITE_OTHER): Payer: 59

## 2021-11-19 DIAGNOSIS — M7671 Peroneal tendinitis, right leg: Secondary | ICD-10-CM | POA: Diagnosis not present

## 2021-11-19 DIAGNOSIS — Z9889 Other specified postprocedural states: Secondary | ICD-10-CM

## 2021-11-19 DIAGNOSIS — J301 Allergic rhinitis due to pollen: Secondary | ICD-10-CM | POA: Diagnosis not present

## 2021-11-19 MED ORDER — TRIAMCINOLONE ACETONIDE 10 MG/ML IJ SUSP
10.0000 mg | Freq: Once | INTRAMUSCULAR | Status: AC
  Administered 2021-11-19: 10 mg

## 2021-11-19 NOTE — Progress Notes (Signed)
Subjective:  ? ?Patient ID: Jacqueline Howell, female   DOB: 46 y.o.   MRN: [id number]  ? ?HPI ?Patient states she is feeling quite a bit better than before surgery and feels like her extensor tendon is functioning and states that she still has some discomfort on that side but has more pain down the outside of her foot that is been inflamed.  States she is walking now and is more active than she was prior to surgery ? ? ?ROS ? ? ?   ?Objective:  ?Physical Exam  ?Neurovascular status intact with slight irritation on the medial aspect where there has been some irritation from the screw from previous with good range of motion and what appears to be reasonable extensor flexor function of the first MPJ there is discomfort in the peroneal insertion and distal to this point right which is probably compensation but has not gotten better on its own ? ?   ?Assessment:  ?Appears to be improving right gradually and I am satisfied at 6 weeks how she is doing with peroneal tendinitis ? ?   ?Plan:  ?Advised her on trying to increase her activity and trying to be purposeful about walking on her whole foot right and I am optimistic about the extensor flexor function.  I did go ahead today and I discussed injection of the right fifth explaining chances for pathology with doing injections he wants the procedure and I carefully did sterile prep and injected the lateral side 3 mg dexamethasone Kenalog 5 mg Xylocaine advised on reduced activity for a few days and reappoint to recheck 8 weeks or earlier if needed ? ?X-rays indicate satisfactory bone appearance and the joint itself looks more healthy in the first metatarsal bone looks more healthy than before surgery ?   ? ? ?

## 2021-11-20 DIAGNOSIS — Z0000 Encounter for general adult medical examination without abnormal findings: Secondary | ICD-10-CM | POA: Diagnosis not present

## 2021-11-26 DIAGNOSIS — J301 Allergic rhinitis due to pollen: Secondary | ICD-10-CM | POA: Diagnosis not present

## 2021-11-26 DIAGNOSIS — H8110 Benign paroxysmal vertigo, unspecified ear: Secondary | ICD-10-CM | POA: Diagnosis not present

## 2021-11-26 DIAGNOSIS — H8101 Meniere's disease, right ear: Secondary | ICD-10-CM | POA: Diagnosis not present

## 2021-11-26 DIAGNOSIS — R5382 Chronic fatigue, unspecified: Secondary | ICD-10-CM | POA: Diagnosis not present

## 2021-12-03 DIAGNOSIS — J301 Allergic rhinitis due to pollen: Secondary | ICD-10-CM | POA: Diagnosis not present

## 2021-12-10 DIAGNOSIS — J301 Allergic rhinitis due to pollen: Secondary | ICD-10-CM | POA: Diagnosis not present

## 2021-12-17 DIAGNOSIS — J301 Allergic rhinitis due to pollen: Secondary | ICD-10-CM | POA: Diagnosis not present

## 2021-12-31 DIAGNOSIS — J301 Allergic rhinitis due to pollen: Secondary | ICD-10-CM | POA: Diagnosis not present

## 2022-01-07 DIAGNOSIS — J301 Allergic rhinitis due to pollen: Secondary | ICD-10-CM | POA: Diagnosis not present

## 2022-01-14 ENCOUNTER — Ambulatory Visit (INDEPENDENT_AMBULATORY_CARE_PROVIDER_SITE_OTHER): Payer: 59

## 2022-01-14 ENCOUNTER — Ambulatory Visit (INDEPENDENT_AMBULATORY_CARE_PROVIDER_SITE_OTHER): Payer: 59 | Admitting: Podiatry

## 2022-01-14 DIAGNOSIS — M7751 Other enthesopathy of right foot: Secondary | ICD-10-CM | POA: Diagnosis not present

## 2022-01-14 DIAGNOSIS — J301 Allergic rhinitis due to pollen: Secondary | ICD-10-CM | POA: Diagnosis not present

## 2022-01-14 DIAGNOSIS — Z9889 Other specified postprocedural states: Secondary | ICD-10-CM | POA: Diagnosis not present

## 2022-01-14 NOTE — Progress Notes (Signed)
sed 

## 2022-01-14 NOTE — Progress Notes (Signed)
Subjective:   Patient ID: Jacqueline Howell, female   DOB: 46 y.o.   MRN: [id number]   HPI Patient states she still gets a lot of swelling in her foot and the area that we did the surgery on is doing great but she is getting pain in the lesser MPJ joints and she gets periodic swelling and bruising that she does not know origin for.  States that the bruising was present prior to the original surgery but it seems to be in a different area and this pain seems to be since the original surgery    ROS      Objective:  Physical Exam  Neurovascular status is found to be intact there is quite a bit of discomfort in the second and third metatarsal phalangeal joint right with inflammation fluid around this area with patient found to have moderate swelling in the area.  First MPJ has reduced range of motion but no crepitus of the joint and there is not a lot of swelling currently around the first MPJ with no lateral pain     Assessment:  Very difficult to tell whether there may be pathology associated with the movement of the first metatarsal head right but it is dorsiflexed and may have some avascular necrosis with the possibility for compensatory capsulitis of the second and third MPJ right and cannot rule out systemic condition     Plan:  H&P I am sending for blood work to rule out arthritic component to this condition and I am sending for MRI to understand better the pathology she is experiencing and around the first MPJ.  May possibly require shortening osteotomy second and third MPJ right and the slight possibility of trying to lower the first MPJ even though I am not sure whether this could be realistic for her.  We will see her after we get results of her test  X-rays indicate the osteotomy right first appears to be solid is quite dorsiflexed and may have some distal avascular necrosis with no indications of arthritis around the second and third MPJ

## 2022-01-16 LAB — CBC WITH DIFFERENTIAL/PLATELET
Basophils Absolute: 0.1 10*3/uL (ref 0.0–0.2)
Basos: 1 %
EOS (ABSOLUTE): 0.1 10*3/uL (ref 0.0–0.4)
Eos: 2 %
Hematocrit: 41.7 % (ref 34.0–46.6)
Hemoglobin: 14.1 g/dL (ref 11.1–15.9)
Immature Grans (Abs): 0 10*3/uL (ref 0.0–0.1)
Immature Granulocytes: 0 %
Lymphocytes Absolute: 3 10*3/uL (ref 0.7–3.1)
Lymphs: 51 %
MCH: 28.9 pg (ref 26.6–33.0)
MCHC: 33.8 g/dL (ref 31.5–35.7)
MCV: 86 fL (ref 79–97)
Monocytes Absolute: 0.4 10*3/uL (ref 0.1–0.9)
Monocytes: 7 %
Neutrophils Absolute: 2.3 10*3/uL (ref 1.4–7.0)
Neutrophils: 39 %
Platelets: 244 10*3/uL (ref 150–450)
RBC: 4.88 x10E6/uL (ref 3.77–5.28)
RDW: 13.2 % (ref 11.7–15.4)
WBC: 5.9 10*3/uL (ref 3.4–10.8)

## 2022-01-16 LAB — RHEUMATOID ARTHRITIS PROFILE
Cyclic Citrullin Peptide Ab: 3 units (ref 0–19)
Rheumatoid fact SerPl-aCnc: 10.1 IU/mL (ref <14.0)

## 2022-01-16 LAB — COMPREHENSIVE METABOLIC PANEL
ALT: 14 IU/L (ref 0–32)
AST: 20 IU/L (ref 0–40)
Albumin/Globulin Ratio: 1.9 (ref 1.2–2.2)
Albumin: 4.6 g/dL (ref 3.8–4.8)
Alkaline Phosphatase: 54 IU/L (ref 44–121)
BUN/Creatinine Ratio: 13 (ref 9–23)
BUN: 11 mg/dL (ref 6–24)
Bilirubin Total: 0.2 mg/dL (ref 0.0–1.2)
CO2: 25 mmol/L (ref 20–29)
Calcium: 9.5 mg/dL (ref 8.7–10.2)
Chloride: 100 mmol/L (ref 96–106)
Creatinine, Ser: 0.85 mg/dL (ref 0.57–1.00)
Globulin, Total: 2.4 g/dL (ref 1.5–4.5)
Glucose: 80 mg/dL (ref 70–99)
Potassium: 4.1 mmol/L (ref 3.5–5.2)
Sodium: 138 mmol/L (ref 134–144)
Total Protein: 7 g/dL (ref 6.0–8.5)
eGFR: 85 mL/min/{1.73_m2} (ref >59)

## 2022-01-16 LAB — SEDIMENTATION RATE: Sed Rate: 6 mm/hr (ref 0–32)

## 2022-01-16 LAB — URIC ACID: Uric Acid: 3.7 mg/dL (ref 2.6–6.2)

## 2022-01-16 LAB — C-REACTIVE PROTEIN: CRP: 1 mg/L (ref 0–10)

## 2022-01-21 DIAGNOSIS — J301 Allergic rhinitis due to pollen: Secondary | ICD-10-CM | POA: Diagnosis not present

## 2022-02-01 ENCOUNTER — Ambulatory Visit
Admission: RE | Admit: 2022-02-01 | Discharge: 2022-02-01 | Disposition: A | Discharge status: Home / Self Care | Payer: 59 | Source: Ambulatory Visit | Attending: Podiatry | Admitting: Podiatry

## 2022-02-01 DIAGNOSIS — M7751 Other enthesopathy of right foot: Secondary | ICD-10-CM

## 2022-02-01 DIAGNOSIS — M19071 Primary osteoarthritis, right ankle and foot: Secondary | ICD-10-CM | POA: Diagnosis not present

## 2022-02-01 DIAGNOSIS — M71571 Other bursitis, not elsewhere classified, right ankle and foot: Secondary | ICD-10-CM | POA: Diagnosis not present

## 2022-02-01 DIAGNOSIS — J301 Allergic rhinitis due to pollen: Secondary | ICD-10-CM | POA: Diagnosis not present

## 2022-02-01 DIAGNOSIS — Z9889 Other specified postprocedural states: Secondary | ICD-10-CM | POA: Diagnosis not present

## 2022-02-04 ENCOUNTER — Encounter: Payer: Self-pay | Admitting: Podiatry

## 2022-02-04 DIAGNOSIS — J301 Allergic rhinitis due to pollen: Secondary | ICD-10-CM | POA: Diagnosis not present

## 2022-02-08 DIAGNOSIS — F900 Attention-deficit hyperactivity disorder, predominantly inattentive type: Secondary | ICD-10-CM | POA: Diagnosis not present

## 2022-02-08 DIAGNOSIS — G4700 Insomnia, unspecified: Secondary | ICD-10-CM | POA: Diagnosis not present

## 2022-02-08 DIAGNOSIS — R69 Illness, unspecified: Secondary | ICD-10-CM | POA: Diagnosis not present

## 2022-02-08 DIAGNOSIS — F411 Generalized anxiety disorder: Secondary | ICD-10-CM | POA: Diagnosis not present

## 2022-02-11 ENCOUNTER — Encounter: Payer: Self-pay | Admitting: Podiatry

## 2022-02-11 ENCOUNTER — Ambulatory Visit (INDEPENDENT_AMBULATORY_CARE_PROVIDER_SITE_OTHER): Payer: 59 | Admitting: Podiatry

## 2022-02-11 DIAGNOSIS — J301 Allergic rhinitis due to pollen: Secondary | ICD-10-CM | POA: Diagnosis not present

## 2022-02-11 DIAGNOSIS — M7751 Other enthesopathy of right foot: Secondary | ICD-10-CM

## 2022-02-11 DIAGNOSIS — M205X1 Other deformities of toe(s) (acquired), right foot: Secondary | ICD-10-CM

## 2022-02-11 MED ORDER — TRIAMCINOLONE ACETONIDE 10 MG/ML IJ SUSP
20.0000 mg | Freq: Once | INTRAMUSCULAR | Status: AC
  Administered 2022-02-11: 20 mg

## 2022-02-11 NOTE — Progress Notes (Signed)
Subjective:   Patient ID: Jacqueline Howell, female   DOB: 46 y.o.   MRN: [id number]   HPI Patient states there is been no change from previous visit and she is having a lot of pain in the big toe joint second joint third joint right and here to review also MRI results   ROS      Objective:  Physical Exam  Neurovascular status intact with reduced range of motion of the first MPJ right with pain on the lateral side of the joint and inflammation fluid of the second and third metatarsal phalangeal joints right foot     Assessment:  Elevated first metatarsal segment secondary to previous surgery which is creating compression of the joint surface and reduced range of motion with inflammation possible of the lateral capsule along with inflammatory capsulitis of the second and third MPJ right which may be due to the elevated first metatarsal segment     Plan:  Reviewed MRI which does not indicate avascular necrosis or indications of acute inflammatory condition with the possibility of condition secondary to loss of motion of the first MPJ which I explained to her today and explained to her hallux limitus.  I discussed case with Dr. Lilian Kapur we do agree it would be difficult to lower the first metatarsal segment but may be necessary and today I did a complete forefoot block right I then aspirated the second and third MPJs giving out clear fluid and injected quarter cc into each joint and I also did a small amount of Kenalog around the first MPJ lateral side and dorsal in a periarticular fashion.  I went to see the response to this and this may guide where we need to go with her long-term and hopefully this will solve her problem and again I did discuss with her at great length the complexity of this condition

## 2022-02-18 DIAGNOSIS — J301 Allergic rhinitis due to pollen: Secondary | ICD-10-CM | POA: Diagnosis not present

## 2022-02-25 ENCOUNTER — Ambulatory Visit (INDEPENDENT_AMBULATORY_CARE_PROVIDER_SITE_OTHER): Payer: 59 | Admitting: Podiatry

## 2022-02-25 ENCOUNTER — Encounter: Payer: Self-pay | Admitting: Podiatry

## 2022-02-25 DIAGNOSIS — M21619 Bunion of unspecified foot: Secondary | ICD-10-CM | POA: Diagnosis not present

## 2022-02-25 DIAGNOSIS — M205X1 Other deformities of toe(s) (acquired), right foot: Secondary | ICD-10-CM | POA: Diagnosis not present

## 2022-02-25 DIAGNOSIS — M7751 Other enthesopathy of right foot: Secondary | ICD-10-CM | POA: Diagnosis not present

## 2022-02-26 NOTE — Progress Notes (Signed)
Subjective:   Patient ID: Jacqueline Howell, female   DOB: 46 y.o.   MRN: [id number]   HPI Patient presents stating that she is feeling quite a bit better with the injections that we had done and for the first time seems to be improved from the previous treatments   ROS      Objective:  Physical Exam  Neurovascular status intact with significant diminishment of discomfort around both the first the second and third MPJs right foot after previous injection treatment     Assessment:  Hopeful that were improved from capsulitis like inflammation right after having had minimal incision surgery and removal of fixation by me     Plan:  Reviewed condition and discussed at great length with care of this condition and we discussed treatment options and the fact that surgery would be very difficult trying to lower the first metatarsal head and that hopefully she will be better for extended period of time with treatment that was rendered

## 2022-05-20 ENCOUNTER — Encounter: Payer: Self-pay | Admitting: Podiatry

## 2022-05-20 ENCOUNTER — Ambulatory Visit (INDEPENDENT_AMBULATORY_CARE_PROVIDER_SITE_OTHER): Payer: 59 | Admitting: Podiatry

## 2022-05-20 ENCOUNTER — Ambulatory Visit (INDEPENDENT_AMBULATORY_CARE_PROVIDER_SITE_OTHER): Payer: 59

## 2022-05-20 DIAGNOSIS — M7751 Other enthesopathy of right foot: Secondary | ICD-10-CM | POA: Diagnosis not present

## 2022-05-20 DIAGNOSIS — M205X1 Other deformities of toe(s) (acquired), right foot: Secondary | ICD-10-CM

## 2022-05-20 NOTE — Progress Notes (Signed)
Dg  

## 2022-05-23 NOTE — Progress Notes (Signed)
Subjective:   Patient ID: Jacqueline Howell, female   DOB: 46 y.o.   MRN: [id number]   HPI Patient presents stating that she still having pain feels like it is more on the first metatarsal and states that the injections were helpful but she does not want any today   ROS      Objective:  Physical Exam  Neurovascular status intact with patient who had a previous distal osteotomy first metatarsal with elevation of the head has had fixation removal and different in injections which have reduce the inflammatory process in the forefoot     Assessment:  Difficult condition as we still are probably dealing with some form of functional hallux limitus condition along with possibility for some bony irritation on the side of the first metatarsal shaft where previous surgery was done     Plan:  H&P numerous x-rays done I did pad around this area on the right and I recommended at 1 point may need to shave bone and still may need some form of revisional surgery.  Hold off currently  X-rays indicated appears to be a problem around the first metatarsal mid shaft with possibility for spur in this area x-ray inconclusive head has healed but it has healed elevated

## 2022-06-22 ENCOUNTER — Other Ambulatory Visit: Payer: Self-pay | Admitting: Podiatry

## 2022-07-05 ENCOUNTER — Encounter
Admission: RE | Admit: 2022-07-05 | Discharge: 2022-07-05 | Disposition: A | Discharge status: Home / Self Care | Payer: 59 | Source: Ambulatory Visit | Attending: Podiatry | Admitting: Podiatry

## 2022-07-05 VITALS — Ht 66.0 in | Wt 146.0 lb

## 2022-07-05 DIAGNOSIS — Z01812 Encounter for preprocedural laboratory examination: Secondary | ICD-10-CM

## 2022-07-05 HISTORY — DX: Attention-deficit hyperactivity disorder, unspecified type: F90.9

## 2022-07-05 HISTORY — DX: Other constipation: K59.09

## 2022-07-05 NOTE — Patient Instructions (Addendum)
Your procedure is scheduled on: Friday, December 15 Report to the Registration Desk on the 1st floor of the [facility] To find out your arrival time, please call [phone] between 1PM - 3PM on: Thursday, December 14 If your arrival time is 6:00 am, do not arrive prior to that time as the [facility] entrance doors do not open until 6:00 am.  REMEMBER: Instructions that are not followed completely may result in serious medical risk, up to and including death; or upon the discretion of your surgeon and anesthesiologist your surgery may need to be rescheduled.  Do not eat food after midnight the night before surgery.  No gum chewing, lozengers or hard candies.  You may however, drink CLEAR liquids up to 2 hours before you are scheduled to arrive for your surgery. Do not drink anything within 2 hours of your scheduled arrival time.  Clear liquids include: - water  - apple juice without pulp - gatorade (not RED colors) - black coffee or tea (Do NOT add milk or creamers to the coffee or tea) Do NOT drink anything that is not on this list.  DO NOT TAKE ANY MEDICATIONS THE MORNING OF SURGERY  One week prior to surgery: Stop Anti-inflammatories (NSAIDS) such as Advil, Aleve, Ibuprofen, Motrin, Naproxen, Naprosyn and Aspirin based products such as Excedrin, Goodys Powder, BC Powder. Stop ANY OVER THE COUNTER supplements until after surgery. You may however, continue to take Tylenol if needed for pain up until the day of surgery.  No Alcohol for 24 hours before or after surgery.  No Smoking including e-cigarettes for 24 hours prior to surgery.  No chewable tobacco products for at least 6 hours prior to surgery.  No nicotine patches on the day of surgery.  Do not use any "recreational" drugs for at least a week prior to your surgery.  Please be advised that the combination of cocaine and anesthesia may have negative outcomes, up to and including death. If you test positive for  cocaine, your surgery will be cancelled.  On the morning of surgery brush your teeth with toothpaste and water, you may rinse your mouth with mouthwash if you wish. Do not swallow any toothpaste or mouthwash.  Use CHG Soap as directed on instruction sheet.  Do not wear jewelry, make-up, hairpins, clips or nail polish.  Do not wear lotions, powders, or perfumes.   Do not shave body from the neck down 48 hours prior to surgery just in case you cut yourself which could leave a site for infection.  Also, freshly shaved skin may become irritated if using the CHG soap.  Contact lenses, hearing aids and dentures may not be worn into surgery.  Do not bring valuables to the hospital. [facility] is not responsible for any missing/lost belongings or valuables.   Notify your doctor if there is any change in your medical condition (cold, fever, infection).  Wear comfortable clothing (specific to your surgery type) to the hospital.  After surgery, you can help prevent lung complications by doing breathing exercises.  Take deep breaths and cough every 1-2 hours. Your doctor may order a device called an Incentive Spirometer to help you take deep breaths.  If you are being discharged the day of surgery, you will not be allowed to drive home. You will need a responsible adult (18 years or older) to drive you home and stay with you that night.   If you are taking public transportation, you will need to have a responsible  adult (18 years or older) with you. Please confirm with your physician that it is acceptable to use public transportation.   Please call the Pre-admissions Testing Dept. at [phone] if you have any questions about these instructions.  Surgery Visitation Policy:  Patients undergoing a surgery or procedure may have two family members or support persons with them as long as the person is not COVID-19 positive or experiencing its symptoms.      Preparing for Surgery with  CHLORHEXIDINE GLUCONATE (CHG) Soap  Chlorhexidine Gluconate (CHG) Soap  o An antiseptic cleaner that kills germs and bonds with the skin to continue killing germs even after washing  o Used for showering the night before surgery and morning of surgery  Before surgery, you can play an important role by reducing the number of germs on your skin.  CHG (Chlorhexidine gluconate) soap is an antiseptic cleanser which kills germs and bonds with the skin to continue killing germs even after washing.  Please do not use if you have an allergy to CHG or antibacterial soaps. If your skin becomes reddened/irritated stop using the CHG.  1. Shower the NIGHT BEFORE SURGERY and the MORNING OF SURGERY with CHG soap.  2. If you choose to wash your hair, wash your hair first as usual with your normal shampoo.  3. After shampooing, rinse your hair and body thoroughly to remove the shampoo.  4. Use CHG as you would any other liquid soap. You can apply CHG directly to the skin and wash gently with a scrungie or a clean washcloth.  5. Apply the CHG soap to your body only from the neck down. Do not use on open wounds or open sores. Avoid contact with your eyes, ears, mouth, and genitals (private parts). Wash face and genitals (private parts) with your normal soap.  6. Wash thoroughly, paying special attention to the area where your surgery will be performed.  7. Thoroughly rinse your body with warm water.  8. Do not shower/wash with your normal soap after using and rinsing off the CHG soap.  9. Pat yourself dry with a clean towel.  10. Wear clean pajamas to bed the night before surgery.  12. Place clean sheets on your bed the night of your first shower and do not sleep with pets.  13. Shower again with the CHG soap on the day of surgery prior to arriving at the hospital.  14. Do not apply any deodorants/lotions/powders.  15. Please wear clean clothes to the hospital.

## 2022-07-08 NOTE — Anesthesia Preprocedure Evaluation (Addendum)
Anesthesia Evaluation  Patient identified by MRN, date of birth, ID band Patient awake    Reviewed: Allergy & Precautions, NPO status , Patient's Chart, lab work & pertinent test results  Airway Mallampati: II  TM Distance: >3 FB Neck ROM: full    Dental no notable dental hx.    Pulmonary neg pulmonary ROS   Pulmonary exam normal        Cardiovascular Exercise Tolerance: Good negative cardio ROS Normal cardiovascular exam     Neuro/Psych  PSYCHIATRIC DISORDERS      negative neurological ROS     GI/Hepatic negative GI ROS, Neg liver ROS,,,  Endo/Other  negative endocrine ROS    Renal/GU negative Renal ROS  negative genitourinary   Musculoskeletal   Abdominal Normal abdominal exam  (+)   Peds  Hematology negative hematology ROS (+)   Anesthesia Other Findings Past Medical History: No date: ADHD (attention deficit hyperactivity disorder) No date: Chronic constipation No date: Depression 03/2018: IBS (irritable bowel syndrome) No date: Meniere disease     Comment:  right ear 05/2016: Vitamin D deficiency  Past Surgical History: 03/29/2019: BLADDER SUSPENSION; N/A     Comment:  Procedure: TRANSVAGINAL TAPE (TVT) PROCEDURE;  Surgeon:               Nadara Mustard, MD;  Location: [facility];  Service:               Gynecology;  Laterality: N/A; 04/24/2018: COLONOSCOPY     Comment:  repeat due in 5 yrs; Dr. Marland Mcalpine 03/29/2019: CYSTOCELE REPAIR; N/A     Comment:  Procedure: ANTERIOR REPAIR (CYSTOCELE);  Surgeon:               Nadara Mustard, MD;  Location: [facility] OR[facility];  Service:               Gynecology;  Laterality: N/A; 03/29/2019: CYSTOSCOPY; N/A     Comment:  Procedure: CYSTOSCOPY;  Surgeon: Nadara Mustard, MD;                Location: [facility] OR[facility];  Service: Gynecology;  Laterality:               N/A; No date: DILATION AND CURETTAGE OF UTERUS 10/06/2021: FOOT HARDWARE REMOVAL; Right     Comment:  screw  removed 03/24/2021: METATARSAL OSTEOTOMY WITH BUNIONECTOMY; Right     Reproductive/Obstetrics negative OB ROS                             Anesthesia Physical Anesthesia Plan  ASA: 2  Anesthesia Plan: General   Post-op Pain Management: Minimal or no pain anticipated   Induction: Intravenous  PONV Risk Score and Plan: 3 and Propofol infusion, TIVA, Treatment may vary due to age or medical condition and Midazolam  Airway Management Planned: Natural Airway  Additional Equipment:   Intra-op Plan:   Post-operative Plan:   Informed Consent: I have reviewed the patients History and Physical, chart, labs and discussed the procedure including the risks, benefits and alternatives for the proposed anesthesia with the patient or authorized representative who has indicated his/her understanding and acceptance.     Dental Advisory Given  Plan Discussed with: Anesthesiologist, CRNA and Surgeon  Anesthesia Plan Comments:         Anesthesia Quick Evaluation

## 2022-07-09 ENCOUNTER — Ambulatory Visit: Payer: 59 | Admitting: Anesthesiology

## 2022-07-09 ENCOUNTER — Other Ambulatory Visit: Payer: Self-pay

## 2022-07-09 ENCOUNTER — Ambulatory Visit: Payer: 59 | Admitting: Urgent Care

## 2022-07-09 ENCOUNTER — Encounter: Payer: Self-pay | Admitting: Podiatry

## 2022-07-09 ENCOUNTER — Ambulatory Visit
Admission: RE | Admit: 2022-07-09 | Discharge: 2022-07-09 | Disposition: A | Discharge status: Home / Self Care | Payer: 59 | Source: Ambulatory Visit | Attending: Podiatry | Admitting: Podiatry

## 2022-07-09 ENCOUNTER — Encounter
Admission: RE | Disposition: A | Discharge status: Home / Self Care | Payer: Self-pay | Source: Ambulatory Visit | Attending: Podiatry

## 2022-07-09 DIAGNOSIS — F901 Attention-deficit hyperactivity disorder, predominantly hyperactive type: Secondary | ICD-10-CM | POA: Diagnosis not present

## 2022-07-09 DIAGNOSIS — G5781 Other specified mononeuropathies of right lower limb: Secondary | ICD-10-CM | POA: Insufficient documentation

## 2022-07-09 DIAGNOSIS — K589 Irritable bowel syndrome without diarrhea: Secondary | ICD-10-CM | POA: Diagnosis not present

## 2022-07-09 HISTORY — PX: EXCISION MORTON'S NEUROMA: SHX5013

## 2022-07-09 LAB — POCT PREGNANCY, URINE: Preg Test, Ur: NEGATIVE

## 2022-07-09 SURGERY — EXCISION, MORTON'S NEUROMA
Anesthesia: General | Site: Foot | Laterality: Right

## 2022-07-09 MED ORDER — LIDOCAINE HCL (PF) 1 % IJ SOLN
INTRAMUSCULAR | Status: AC
  Filled 2022-07-09: qty 30

## 2022-07-09 MED ORDER — ONDANSETRON HCL 4 MG/2ML IJ SOLN
INTRAMUSCULAR | Status: AC
  Filled 2022-07-09: qty 2

## 2022-07-09 MED ORDER — KETAMINE HCL 50 MG/5ML IJ SOSY
PREFILLED_SYRINGE | INTRAMUSCULAR | Status: AC
  Filled 2022-07-09: qty 5

## 2022-07-09 MED ORDER — FENTANYL CITRATE (PF) 100 MCG/2ML IJ SOLN
INTRAMUSCULAR | Status: AC
  Filled 2022-07-09: qty 2

## 2022-07-09 MED ORDER — FENTANYL CITRATE (PF) 100 MCG/2ML IJ SOLN
25.0000 ug | INTRAMUSCULAR | Status: DC | PRN
  Administered 2022-07-09: 25 ug via INTRAVENOUS

## 2022-07-09 MED ORDER — OXYCODONE HCL 5 MG/5ML PO SOLN: 5.0000 mg | Freq: Once | ORAL | Status: AC | PRN

## 2022-07-09 MED ORDER — CEFAZOLIN SODIUM-DEXTROSE 2-4 GM/100ML-% IV SOLN
INTRAVENOUS | Status: AC
  Filled 2022-07-09: qty 100

## 2022-07-09 MED ORDER — DEXAMETHASONE SODIUM PHOSPHATE 10 MG/ML IJ SOLN
INTRAMUSCULAR | Status: DC | PRN
  Administered 2022-07-09: 8 mg via INTRAVENOUS

## 2022-07-09 MED ORDER — FAMOTIDINE 20 MG PO TABS: 20.0000 mg | ORAL_TABLET | Freq: Once | ORAL | Status: AC

## 2022-07-09 MED ORDER — BUPIVACAINE HCL (PF) 0.5 % IJ SOLN
INTRAMUSCULAR | Status: DC | PRN
  Administered 2022-07-09: 4 mL

## 2022-07-09 MED ORDER — MIDAZOLAM HCL 2 MG/2ML IJ SOLN
INTRAMUSCULAR | Status: AC
  Filled 2022-07-09: qty 2

## 2022-07-09 MED ORDER — OXYCODONE HCL 5 MG PO TABS
ORAL_TABLET | ORAL | Status: AC
  Filled 2022-07-09: qty 1

## 2022-07-09 MED ORDER — PROPOFOL 500 MG/50ML IV EMUL
INTRAVENOUS | Status: DC | PRN
  Administered 2022-07-09: 100 ug/kg/min via INTRAVENOUS

## 2022-07-09 MED ORDER — LACTATED RINGERS IV SOLN: INTRAVENOUS | Status: DC | PRN

## 2022-07-09 MED ORDER — KETAMINE HCL 10 MG/ML IJ SOLN
INTRAMUSCULAR | Status: DC | PRN
  Administered 2022-07-09: 30 mg via INTRAVENOUS

## 2022-07-09 MED ORDER — FENTANYL CITRATE (PF) 100 MCG/2ML IJ SOLN
INTRAMUSCULAR | Status: DC | PRN
  Administered 2022-07-09 (×2): 25 ug via INTRAVENOUS

## 2022-07-09 MED ORDER — OXYCODONE-ACETAMINOPHEN 5-325 MG PO TABS: 1.0000 | ORAL_TABLET | Freq: Four times a day (QID) | ORAL | 0 refills | Status: DC | PRN

## 2022-07-09 MED ORDER — CEFAZOLIN SODIUM-DEXTROSE 2-4 GM/100ML-% IV SOLN
2.0000 g | INTRAVENOUS | Status: AC
  Administered 2022-07-09: 2 g via INTRAVENOUS

## 2022-07-09 MED ORDER — FAMOTIDINE 20 MG PO TABS
ORAL_TABLET | ORAL | Status: AC
  Administered 2022-07-09: 20 mg via ORAL
  Filled 2022-07-09: qty 1

## 2022-07-09 MED ORDER — MIDAZOLAM HCL 2 MG/2ML IJ SOLN
INTRAMUSCULAR | Status: DC | PRN
  Administered 2022-07-09: 2 mg via INTRAVENOUS

## 2022-07-09 MED ORDER — PROMETHAZINE HCL 25 MG/ML IJ SOLN: 6.2500 mg | INTRAMUSCULAR | Status: DC | PRN

## 2022-07-09 MED ORDER — CHLORHEXIDINE GLUCONATE 0.12 % MT SOLN
OROMUCOSAL | Status: AC
  Administered 2022-07-09: 15 mL
  Filled 2022-07-09: qty 15

## 2022-07-09 MED ORDER — PROPOFOL 10 MG/ML IV BOLUS
INTRAVENOUS | Status: DC | PRN
  Administered 2022-07-09: 40 mg via INTRAVENOUS

## 2022-07-09 MED ORDER — ONDANSETRON HCL 4 MG/2ML IJ SOLN
INTRAMUSCULAR | Status: DC | PRN
  Administered 2022-07-09: 4 mg via INTRAVENOUS

## 2022-07-09 MED ORDER — DROPERIDOL 2.5 MG/ML IJ SOLN: 0.6250 mg | Freq: Once | INTRAMUSCULAR | Status: DC | PRN

## 2022-07-09 MED ORDER — PROPOFOL 1000 MG/100ML IV EMUL
INTRAVENOUS | Status: AC
  Filled 2022-07-09: qty 100

## 2022-07-09 MED ORDER — 0.9 % SODIUM CHLORIDE (POUR BTL) OPTIME
TOPICAL | Status: DC | PRN
  Administered 2022-07-09: 200 mL

## 2022-07-09 MED ORDER — LIDOCAINE-EPINEPHRINE 1 %-1:100000 IJ SOLN
INTRAMUSCULAR | Status: AC
  Filled 2022-07-09: qty 1

## 2022-07-09 MED ORDER — LIDOCAINE-EPINEPHRINE 1 %-1:100000 IJ SOLN
INTRAMUSCULAR | Status: DC | PRN
  Administered 2022-07-09: 4 mL

## 2022-07-09 MED ORDER — PROPOFOL 10 MG/ML IV BOLUS
INTRAVENOUS | Status: AC
  Filled 2022-07-09: qty 20

## 2022-07-09 MED ORDER — OXYCODONE HCL 5 MG PO TABS
5.0000 mg | ORAL_TABLET | Freq: Once | ORAL | Status: AC | PRN
  Administered 2022-07-09: 5 mg via ORAL

## 2022-07-09 MED ORDER — DEXAMETHASONE SODIUM PHOSPHATE 10 MG/ML IJ SOLN
INTRAMUSCULAR | Status: AC
  Filled 2022-07-09: qty 1

## 2022-07-09 MED ORDER — ACETAMINOPHEN 10 MG/ML IV SOLN: 1000.0000 mg | Freq: Once | INTRAVENOUS | Status: DC | PRN

## 2022-07-09 SURGICAL SUPPLY — 30 items
BNDG ELASTIC 4X5.8 VLCR NS LF (GAUZE/BANDAGES/DRESSINGS) ×1 IMPLANT
BNDG ESMARK 4X12 TAN STRL LF (GAUZE/BANDAGES/DRESSINGS) ×1 IMPLANT
BNDG GAUZE DERMACEA FLUFF 4 (GAUZE/BANDAGES/DRESSINGS) ×1 IMPLANT
BNDG STRETCH GAUZE 3IN X12FT (GAUZE/BANDAGES/DRESSINGS) ×1 IMPLANT
DURAPREP 26ML APPLICATOR (WOUND CARE) ×1 IMPLANT
ELECT REM PT RETURN 9FT ADLT (ELECTROSURGICAL) ×1
ELECTRODE REM PT RTRN 9FT ADLT (ELECTROSURGICAL) ×1 IMPLANT
GAUZE SPONGE 4X4 12PLY STRL (GAUZE/BANDAGES/DRESSINGS) ×1 IMPLANT
GAUZE XEROFORM 1X8 LF (GAUZE/BANDAGES/DRESSINGS) ×1 IMPLANT
GLOVE BIO SURGEON STRL SZ7.5 (GLOVE) ×1 IMPLANT
GLOVE SURG UNDER LTX SZ8 (GLOVE) ×1 IMPLANT
GOWN STRL REUS W/ TWL XL LVL3 (GOWN DISPOSABLE) ×2 IMPLANT
GOWN STRL REUS W/TWL XL LVL3 (GOWN DISPOSABLE) ×2
LABEL OR SOLS (LABEL) ×1 IMPLANT
MANIFOLD NEPTUNE II (INSTRUMENTS) ×1 IMPLANT
NDL FILTER BLUNT 18X1 1/2 (NEEDLE) ×1 IMPLANT
NDL HYPO 25X1 1.5 SAFETY (NEEDLE) ×2 IMPLANT
NEEDLE FILTER BLUNT 18X1 1/2 (NEEDLE) ×1 IMPLANT
NEEDLE HYPO 25X1 1.5 SAFETY (NEEDLE) ×2 IMPLANT
NS IRRIG 500ML POUR BTL (IV SOLUTION) ×1 IMPLANT
PACK EXTREMITY ARMC (MISCELLANEOUS) ×1 IMPLANT
STOCKINETTE M/LG 89821 (MISCELLANEOUS) ×1 IMPLANT
STRIP CLOSURE SKIN 1/4X4 (GAUZE/BANDAGES/DRESSINGS) ×1 IMPLANT
SUT MNCRL 4-0 (SUTURE) ×1
SUT MNCRL 4-0 27XMFL (SUTURE) ×1
SUT VIC AB 3-0 SH 27 (SUTURE) ×1
SUT VIC AB 3-0 SH 27X BRD (SUTURE) IMPLANT
SUTURE MNCRL 4-0 27XMF (SUTURE) IMPLANT
SYR 10ML LL (SYRINGE) ×1 IMPLANT
TRAP FLUID SMOKE EVACUATOR (MISCELLANEOUS) ×1 IMPLANT

## 2022-07-09 NOTE — Op Note (Signed)
Operative note   Surgeon:Rakwon Letourneau [employer] forces logistics/support/administrative officer: None    Preop diagnosis: Right third webspace neuroma    Postop diagnosis: None    Procedure: Excision right third webspace neuroma    EBL: Minimal    Anesthesia:local and IV sedation 4 cc of 1% lidocaine and 4 cc of 0.5% bupivacaine    Hemostasis: Lidocaine with epinephrine infiltrated along the incision site    Specimen: Morton's neuroma right third webspace    Complications: None    Operative indications:Jacqueline Howell is an 46 y.o. that presents today for surgical intervention.  The risks/benefits/alternatives/complications have been discussed and consent has been given.    Procedure:  Patient was brought into the OR and placed on the operating table in thesupine position. After anesthesia was obtained theright lower extremity was prepped and draped in usual sterile fashion.  Attention was directed to the right third webspace where a dorsal incision was placed at the intermetatarsal space region.  Sharp and blunt dissection down to the DTIL.  The intermetatarsal space was then entered.  Deep dissection was taken down to the plantar aspect of the intermetatarsal space.  At this time the ranches to the third and fourth toe of the neuroma were noted and dissected.  The neuroma was then dissected back to the level of the metatarsal phalangeal joint and metatarsal head region.  This was then incised at this time.  This was sent for pathological examination.  The wound was flushed with copious amounts of irrigation.  Further dissection did not reveal any other abnormal tissue.  The wound was then closed with a 4-0 Vicryl for the deeper tissue and a 4-0 Monocryl for the skin.    Patient tolerated the procedure and anesthesia well.  Was transported from the OR to the PACU with all vital signs stable and vascular status intact. To be discharged per routine protocol.  Will follow up in approximately 1 week in the outpatient clinic.

## 2022-07-09 NOTE — H&P (Signed)
HISTORY AND PHYSICAL INTERVAL NOTE:  07/09/2022  9:30 AM  Jacqueline Howell  has presented today for surgery, with the diagnosis of G57.61 - Lesion of right plantar nerve.  The various methods of treatment have been discussed with the patient.  No guarantees were given.  After consideration of risks, benefits and other options for treatment, the patient has consented to surgery.  I have reviewed the patients' chart and labs.     A history and physical examination was performed in my office.  The patient was reexamined.  There have been no changes to this history and physical examination.  [name] A

## 2022-07-09 NOTE — Transfer of Care (Signed)
Immediate Anesthesia Transfer of Care Note  Patient: Jacqueline Howell  Procedure(s) Performed: EXCISION MORTON'S NEUROMA (Right: Foot)  Patient Location: PACU  Anesthesia Type:General  Level of Consciousness: drowsy  Airway & Oxygen Therapy: Patient Spontanous Breathing and Patient connected to face mask oxygen  Post-op Assessment: Report given to RN and Post -op Vital signs reviewed and stable  Post vital signs: stable  Last Vitals:  Vitals Value Taken Time  BP 114/76 07/09/22 1039  Temp    Pulse 60 07/09/22 1041  Resp 8 07/09/22 1041  SpO2 100 % 07/09/22 1041  Vitals shown include unvalidated device data.  Last Pain:  Vitals:   07/09/22 0902  TempSrc: Temporal  PainSc: 0-No pain         Complications: No notable events documented.

## 2022-07-09 NOTE — Discharge Instructions (Addendum)
[facility]  POST OPERATIVE INSTRUCTIONS FOR DR. FOWLER AND DR[facility] PODIATRY DEPARTMENT   Take your medication as prescribed.  Pain medication should be taken only as needed.  Keep the dressing clean, dry and intact.  Keep your foot elevated above the heart level for the first 48 hours.  Walking to the bathroom and brief periods of walking are acceptable, unless we have instructed you to be non-weight bearing.  Always wear your post-op shoe when walking.  Always use your crutches if you are to be non-weight bearing.  Do not take a shower. Baths are permissible as long as the foot is kept out of the water.   Every hour you are awake:  Bend your knee 15 times. Flex foot 15 times Massage calf 15 times  Call [facility] ([phone]) if any of the following problems occur: You develop a temperature or fever. The bandage becomes saturated with blood. Medication does not stop your pain. Injury of the foot occurs. Any symptoms of infection including redness, odor, or red streaks running from wound.   AMBULATORY SURGERY  DISCHARGE INSTRUCTIONS   The drugs that you were given will stay in your system until tomorrow so for the next 24 hours you should not:  Drive an automobile Make any legal decisions Drink any alcoholic beverage   You may resume regular meals tomorrow.  Today it is better to start with liquids and gradually work up to solid foods.  You may eat anything you prefer, but it is better to start with liquids, then soup and crackers, and gradually work up to solid foods.   Please notify your doctor immediately if you have any unusual bleeding, trouble breathing, redness and pain at the surgery site, drainage, fever, or pain not relieved by medication.    Your post-operative visit with Dr.                                       is: Date:                        Time:    Please call to schedule your  post-operative visit.  Additional Instructions:  

## 2022-07-09 NOTE — Anesthesia Postprocedure Evaluation (Signed)
Anesthesia Post Note  Patient: Jacqueline Howell  Procedure(s) Performed: EXCISION MORTON'S NEUROMA (Right: Foot)  Patient location during evaluation: PACU Anesthesia Type: General Level of consciousness: awake and alert Pain management: pain level controlled Vital Signs Assessment: post-procedure vital signs reviewed and stable Respiratory status: spontaneous breathing, nonlabored ventilation and respiratory function stable Cardiovascular status: blood pressure returned to baseline and stable Postop Assessment: no apparent nausea or vomiting Anesthetic complications: no   No notable events documented.   Last Vitals:  Vitals:   07/09/22 1135 07/09/22 1145  BP:  120/84  Pulse: (!) 56 76  Resp: 11 16  Temp:  37 C  SpO2: 95% 97%    Last Pain:  Vitals:   07/09/22 1145  TempSrc: Temporal  PainSc: 0-No pain                 [name]

## 2022-07-10 ENCOUNTER — Encounter: Payer: Self-pay | Admitting: Podiatry

## 2022-07-12 LAB — SURGICAL PATHOLOGY

## 2022-08-03 ENCOUNTER — Encounter: Payer: Self-pay | Admitting: Podiatry

## 2022-10-18 ENCOUNTER — Other Ambulatory Visit: Payer: Self-pay | Admitting: Podiatry

## 2022-10-18 ENCOUNTER — Encounter: Payer: Self-pay | Admitting: Podiatry

## 2022-10-18 DIAGNOSIS — G5761 Lesion of plantar nerve, right lower limb: Secondary | ICD-10-CM

## 2022-10-21 ENCOUNTER — Inpatient Hospital Stay
Admission: EM | Admit: 2022-10-21 | Discharge: 2022-10-23 | DRG: 580 | Disposition: A | Discharge status: Home / Self Care | Payer: 59 | Attending: Internal Medicine | Admitting: Internal Medicine

## 2022-10-21 ENCOUNTER — Encounter: Payer: Self-pay | Admitting: Emergency Medicine

## 2022-10-21 ENCOUNTER — Ambulatory Visit
Admission: RE | Admit: 2022-10-21 | Discharge: 2022-10-21 | Disposition: A | Discharge status: Home / Self Care | Payer: 59 | Source: Ambulatory Visit | Attending: Podiatry | Admitting: Podiatry

## 2022-10-21 ENCOUNTER — Other Ambulatory Visit: Payer: Self-pay

## 2022-10-21 DIAGNOSIS — K5909 Other constipation: Secondary | ICD-10-CM | POA: Diagnosis present

## 2022-10-21 DIAGNOSIS — L02611 Cutaneous abscess of right foot: Secondary | ICD-10-CM | POA: Diagnosis present

## 2022-10-21 DIAGNOSIS — Z888 Allergy status to other drugs, medicaments and biological substances status: Secondary | ICD-10-CM | POA: Diagnosis not present

## 2022-10-21 DIAGNOSIS — H8109 Meniere's disease, unspecified ear: Secondary | ICD-10-CM | POA: Diagnosis present

## 2022-10-21 DIAGNOSIS — Z9889 Other specified postprocedural states: Secondary | ICD-10-CM | POA: Diagnosis not present

## 2022-10-21 DIAGNOSIS — L03115 Cellulitis of right lower limb: Secondary | ICD-10-CM | POA: Diagnosis present

## 2022-10-21 DIAGNOSIS — Z800 Family history of malignant neoplasm of digestive organs: Secondary | ICD-10-CM

## 2022-10-21 DIAGNOSIS — L03031 Cellulitis of right toe: Secondary | ICD-10-CM | POA: Diagnosis not present

## 2022-10-21 DIAGNOSIS — F909 Attention-deficit hyperactivity disorder, unspecified type: Secondary | ICD-10-CM | POA: Diagnosis present

## 2022-10-21 DIAGNOSIS — I96 Gangrene, not elsewhere classified: Secondary | ICD-10-CM | POA: Diagnosis present

## 2022-10-21 DIAGNOSIS — M869 Osteomyelitis, unspecified: Secondary | ICD-10-CM

## 2022-10-21 DIAGNOSIS — Z8349 Family history of other endocrine, nutritional and metabolic diseases: Secondary | ICD-10-CM

## 2022-10-21 DIAGNOSIS — Z881 Allergy status to other antibiotic agents status: Secondary | ICD-10-CM | POA: Diagnosis not present

## 2022-10-21 DIAGNOSIS — Z885 Allergy status to narcotic agent status: Secondary | ICD-10-CM | POA: Diagnosis not present

## 2022-10-21 DIAGNOSIS — G5761 Lesion of plantar nerve, right lower limb: Secondary | ICD-10-CM

## 2022-10-21 LAB — CBC WITH DIFFERENTIAL/PLATELET
Abs Immature Granulocytes: 0.03 10*3/uL (ref 0.00–0.07)
Basophils Absolute: 0.1 10*3/uL (ref 0.0–0.1)
Basophils Relative: 1 %
Eosinophils Absolute: 0.1 10*3/uL (ref 0.0–0.5)
Eosinophils Relative: 1 %
HCT: 42.3 % (ref 36.0–46.0)
Hemoglobin: 14.1 g/dL (ref 12.0–15.0)
Immature Granulocytes: 0 %
Lymphocytes Relative: 44 %
Lymphs Abs: 3.2 10*3/uL (ref 0.7–4.0)
MCH: 28.7 pg (ref 26.0–34.0)
MCHC: 33.3 g/dL (ref 30.0–36.0)
MCV: 86 fL (ref 80.0–100.0)
Monocytes Absolute: 0.3 10*3/uL (ref 0.1–1.0)
Monocytes Relative: 4 %
Neutro Abs: 3.7 10*3/uL (ref 1.7–7.7)
Neutrophils Relative %: 50 %
Platelets: 235 10*3/uL (ref 150–400)
RBC: 4.92 MIL/uL (ref 3.87–5.11)
RDW: 12.4 % (ref 11.5–15.5)
WBC: 7.4 10*3/uL (ref 4.0–10.5)
nRBC: 0 % (ref 0.0–0.2)

## 2022-10-21 LAB — COMPREHENSIVE METABOLIC PANEL
ALT: 13 U/L (ref 0–44)
AST: 21 U/L (ref 15–41)
Albumin: 4.1 g/dL (ref 3.5–5.0)
Alkaline Phosphatase: 57 U/L (ref 38–126)
Anion gap: 11 (ref 5–15)
BUN: 12 mg/dL (ref 6–20)
CO2: 27 mmol/L (ref 22–32)
Calcium: 9 mg/dL (ref 8.9–10.3)
Chloride: 99 mmol/L (ref 98–111)
Creatinine, Ser: 0.71 mg/dL (ref 0.44–1.00)
GFR, Estimated: >60 mL/min (ref >60)
Glucose, Bld: 86 mg/dL (ref 70–99)
Potassium: 3.2 mmol/L — ABNORMAL LOW (ref 3.5–5.1)
Sodium: 137 mmol/L (ref 135–145)
Total Bilirubin: 0.6 mg/dL (ref 0.3–1.2)
Total Protein: 7.3 g/dL (ref 6.5–8.1)

## 2022-10-21 LAB — LACTIC ACID, PLASMA: Lactic Acid, Venous: 0.7 mmol/L (ref 0.5–1.9)

## 2022-10-21 MED ORDER — OXYCODONE-ACETAMINOPHEN 5-325 MG PO TABS
1.0000 | ORAL_TABLET | Freq: Four times a day (QID) | ORAL | Status: DC | PRN
  Administered 2022-10-21: 1 via ORAL
  Administered 2022-10-22: 2 via ORAL
  Administered 2022-10-22: 1 via ORAL
  Administered 2022-10-23 (×2): 2 via ORAL
  Filled 2022-10-21 (×2): qty 2
  Filled 2022-10-21: qty 1
  Filled 2022-10-21: qty 2
  Filled 2022-10-21: qty 1

## 2022-10-21 MED ORDER — ONDANSETRON HCL 4 MG PO TABS: 4.0000 mg | ORAL_TABLET | Freq: Four times a day (QID) | ORAL | Status: DC | PRN

## 2022-10-21 MED ORDER — ONDANSETRON HCL 4 MG/2ML IJ SOLN: 4.0000 mg | Freq: Four times a day (QID) | INTRAMUSCULAR | Status: DC | PRN

## 2022-10-21 MED ORDER — HYDROCODONE-ACETAMINOPHEN 5-325 MG PO TABS
1.0000 | ORAL_TABLET | ORAL | Status: DC | PRN
  Administered 2022-10-22 – 2022-10-23 (×3): 2 via ORAL
  Filled 2022-10-21 (×3): qty 2

## 2022-10-21 MED ORDER — MORPHINE SULFATE (PF) 2 MG/ML IV SOLN
2.0000 mg | Freq: Once | INTRAVENOUS | Status: AC
  Administered 2022-10-21: 2 mg via INTRAVENOUS
  Filled 2022-10-21: qty 1

## 2022-10-21 MED ORDER — VANCOMYCIN HCL IN DEXTROSE 1-5 GM/200ML-% IV SOLN
1000.0000 mg | Freq: Once | INTRAVENOUS | Status: AC
  Administered 2022-10-21: 1000 mg via INTRAVENOUS
  Filled 2022-10-21: qty 200

## 2022-10-21 MED ORDER — VANCOMYCIN HCL 1750 MG/350ML IV SOLN
1750.0000 mg | INTRAVENOUS | Status: DC
  Administered 2022-10-22: 1750 mg via INTRAVENOUS
  Filled 2022-10-21 (×2): qty 350

## 2022-10-21 MED ORDER — MORPHINE SULFATE (PF) 2 MG/ML IV SOLN
2.0000 mg | INTRAVENOUS | Status: DC | PRN
  Administered 2022-10-22: 2 mg via INTRAVENOUS
  Filled 2022-10-21: qty 1

## 2022-10-21 MED ORDER — LISDEXAMFETAMINE DIMESYLATE 50 MG PO CAPS: 50.0000 mg | ORAL_CAPSULE | Freq: Every day | ORAL | Status: DC

## 2022-10-21 MED ORDER — ACETAMINOPHEN 650 MG RE SUPP: 650.0000 mg | Freq: Four times a day (QID) | RECTAL | Status: DC | PRN

## 2022-10-21 MED ORDER — ACETAMINOPHEN 325 MG PO TABS: 650.0000 mg | ORAL_TABLET | Freq: Four times a day (QID) | ORAL | Status: DC | PRN

## 2022-10-21 MED ORDER — SODIUM CHLORIDE 0.9 % IV SOLN: Freq: Once | INTRAVENOUS | Status: AC

## 2022-10-21 MED ORDER — VANCOMYCIN HCL 1750 MG/350ML IV SOLN: 1750.0000 mg | INTRAVENOUS | Status: DC

## 2022-10-21 MED ORDER — ORAL CARE MOUTH RINSE: 15.0000 mL | OROMUCOSAL | Status: DC | PRN

## 2022-10-21 MED ORDER — SODIUM CHLORIDE 0.9 % IV SOLN
1.0000 g | INTRAVENOUS | Status: DC
  Administered 2022-10-21 – 2022-10-22 (×2): 1 g via INTRAVENOUS
  Filled 2022-10-21: qty 1
  Filled 2022-10-21 (×2): qty 10

## 2022-10-21 MED ORDER — VANCOMYCIN HCL 500 MG/100ML IV SOLN
500.0000 mg | Freq: Once | INTRAVENOUS | Status: AC
  Administered 2022-10-22: 500 mg via INTRAVENOUS
  Filled 2022-10-21: qty 100

## 2022-10-21 NOTE — ED Notes (Signed)
Lab called to check on initial lactic- lab states they did not receive and will receive now and run test

## 2022-10-21 NOTE — Plan of Care (Signed)
  Problem: Activity: Goal: Risk for activity intolerance will decrease Outcome: Progressing   Problem: Nutrition: Goal: Adequate nutrition will be maintained Outcome: Progressing   Problem: Coping: Goal: Level of anxiety will decrease Outcome: Progressing   Problem: Elimination: Goal: Will not experience complications related to urinary retention Outcome: Progressing   

## 2022-10-21 NOTE — Consult Note (Signed)
PODIATRY / FOOT AND ANKLE SURGERY CONSULTATION NOTE  Requesting Physician: Dr. Damita Dunnings  Reason for consult: Right foot infection  Chief Complaint: Right foot infection/swelling/redness and pain   HPI: Jacqueline Howell is a 47 y.o. female who presents with right foot infection.  Patient had a neuroma resection by Dr. Vickki Muff in December 2023.  Patient subsequently developed an infection which resolved with p.o. antibiotics.  Patient has been having increased redness and swelling over time to the area.  Patient has been off and on p.o. antibiotics for this issue.  Patient subsequently had an MRI taken which revealed an abscess to the third interspace as well as possible osteomyelitis to the third and fourth toes.  Patient was sent to the emergency room for further evaluation and for surgical intervention.  PMHx:  Past Medical History:  Diagnosis Date   ADHD (attention deficit hyperactivity disorder)    Chronic constipation    Depression    IBS (irritable bowel syndrome) 03/2018   Meniere disease    right ear   Vitamin D deficiency 05/2016    Surgical Hx:  Past Surgical History:  Procedure Laterality Date   BLADDER SUSPENSION N/A 03/29/2019   Procedure: TRANSVAGINAL TAPE (TVT) PROCEDURE;  Surgeon: Gae Dry, MD;  Location: [facility] ORS;  Service: Gynecology;  Laterality: N/A;   COLONOSCOPY  04/24/2018   repeat due in 5 yrs; Dr. Aloha Gell REPAIR N/A 03/29/2019   Procedure: ANTERIOR REPAIR (CYSTOCELE);  Surgeon: Gae Dry, MD;  Location: [facility] OR[facility];  Service: Gynecology;  Laterality: N/A;   CYSTOSCOPY N/A 03/29/2019   Procedure: CYSTOSCOPY;  Surgeon: Gae Dry, MD;  Location: [facility] OR[facility];  Service: Gynecology;  Laterality: N/A;   DILATION AND CURETTAGE OF UTERUS     EXCISION MORTON'S NEUROMA Right 07/09/2022   Procedure: EXCISION MORTON'S NEUROMA;  Surgeon: Samara Deist, DPM;  Location: [facility] OR[facility];  Service: Podiatry;  Laterality: Right;   FOOT HARDWARE REMOVAL Right  10/06/2021   screw removed   METATARSAL OSTEOTOMY WITH BUNIONECTOMY Right 03/24/2021    FHx:  Family History  Problem Relation Age of Onset   Thyroid disease Mother    Meniere's disease Father    Colon cancer Father 22   Breast cancer Neg Hx     Social History:  reports that she has never smoked. She has never used smokeless tobacco. She reports that she does not drink alcohol and does not use drugs.  Allergies:  Allergies  Allergen Reactions   Biaxin [Clarithromycin] Nausea And Vomiting and Other (See Comments)    Migraines, vomiting   Omnicef [Cefdinir] Hives   (Not in a hospital admission)   Physical Exam: General: Alert and oriented.  No apparent distress.  Vascular: DP/PT pulses palpable bilateral.  Capillary fill time intact to digits bilateral.  No hair growth noted to digits bilateral.  Neuro: Light touch sensation intact to digits bilaterally.  Derm: No open skin lesions or ulcerations present to bilateral lower extremities.  Previous scar formation noted over the first metatarsal phalange joint, fifth metatarsal phalange joint, and third interspace right foot.  MSK: Pain on palpation right third interspace with palpable fluctuance to the area.  Associated erythema and edema present to the right third and fourth toes extending to the midshaft of metatarsals 3 and 4 right.  Results for orders placed or performed during the hospital encounter of 10/21/22 (from the past 48 hour(s))  CBC with Differential     Status: None   Collection Time: 10/21/22  7:49 PM  Result Value Ref Range   WBC 7.4 4.0 - 10.5 K/uL   RBC 4.92 3.87 - 5.11 MIL/uL   Hemoglobin 14.1 12.0 - 15.0 g/dL   HCT 42.3 36.0 - 46.0 %   MCV 86.0 80.0 - 100.0 fL   MCH 28.7 26.0 - 34.0 pg   MCHC 33.3 30.0 - 36.0 g/dL   RDW 12.4 11.5 - 15.5 %   Platelets 235 150 - 400 K/uL   nRBC 0.0 0.0 - 0.2 %   Neutrophils Relative % 50 %   Neutro Abs 3.7 1.7 - 7.7 K/uL   Lymphocytes Relative 44 %   Lymphs Abs 3.2  0.7 - 4.0 K/uL   Monocytes Relative 4 %   Monocytes Absolute 0.3 0.1 - 1.0 K/uL   Eosinophils Relative 1 %   Eosinophils Absolute 0.1 0.0 - 0.5 K/uL   Basophils Relative 1 %   Basophils Absolute 0.1 0.0 - 0.1 K/uL   Immature Granulocytes 0 %   Abs Immature Granulocytes 0.03 0.00 - 0.07 K/uL    Comment: Performed at [facility], [address], [place] [address]  Comprehensive metabolic panel     Status: Abnormal   Collection Time: 10/21/22  7:49 PM  Result Value Ref Range   Sodium 137 135 - 145 mmol/L   Potassium 3.2 (L) 3.5 - 5.1 mmol/L   Chloride 99 98 - 111 mmol/L   CO2 27 22 - 32 mmol/L   Glucose, Bld 86 70 - 99 mg/dL    Comment: Glucose reference range applies only to samples taken after fasting for at least 8 hours.   BUN 12 6 - 20 mg/dL   Creatinine, Ser 0.71 0.44 - 1.00 mg/dL   Calcium 9.0 8.9 - 10.3 mg/dL   Total Protein 7.3 6.5 - 8.1 g/dL   Albumin 4.1 3.5 - 5.0 g/dL   AST 21 15 - 41 U/L   ALT 13 0 - 44 U/L   Alkaline Phosphatase 57 38 - 126 U/L   Total Bilirubin 0.6 0.3 - 1.2 mg/dL   GFR, Estimated >60 >60 mL/min    Comment: (NOTE) Calculated using the CKD-EPI Creatinine Equation (2021)    Anion gap 11 5 - 15    Comment: Performed at [facility], [address] [place], [place] [address]  Lactic acid, plasma     Status: None   Collection Time: 10/21/22  7:49 PM  Result Value Ref Range   Lactic Acid, Venous 0.7 0.5 - 1.9 mmol/L    Comment: Performed at [facility], [address], [place], [place] [address]   MR FOOT RIGHT WO CONTRAST  Result Date: 10/21/2022 CLINICAL DATA:  Right foot infection.  History of prior surgery. EXAM: MRI OF THE RIGHT FOREFOOT WITHOUT CONTRAST TECHNIQUE: Multiplanar, multisequence MR imaging of the right foot was performed. No intravenous contrast was administered. COMPARISON:  Prior study 02/01/2022 FINDINGS: Complex fluid collection between the third and fourth metatarsal heads and proximal  phalanges. Findings concerning for an abscess. There is also significant surrounding inflammatory changes. Abnormal T1 and T2 signal intensity in the proximal phalanx of the fourth toe is suspicious for osteomyelitis. No definite findings for septic arthritis. Similar but less significant signal abnormality in the proximal phalanx of the third toe could suggest early osteomyelitis. Stable remote postsurgical changes involving the first metatarsal and first proximal phalanx. No findings to suggest myofasciitis or pyomyositis. IMPRESSION: 1. Complex fluid collection between the third and fourth metatarsal heads  and proximal phalanges concerning for an abscess. 2. Abnormal T1 and T2 signal intensity in the proximal phalanx of the fourth toe is consistent with osteomyelitis. 3. Similar but less significant signal abnormality in the proximal phalanx of the third toe could suggest early osteomyelitis. 4. Stable remote postsurgical changes involving the first metatarsal and first proximal phalanx. 5. No findings to suggest myofasciitis or pyomyositis. Electronically Signed   By: Marijo Sanes M.D.   On: 10/21/2022 14:57    Blood pressure 132/88, pulse 75, temperature 98.4 F (36.9 C), temperature source Oral, resp. rate 15, height 5\' 6"  (1.676 m), weight 65.8 kg, last menstrual period 10/21/2022, SpO2 97 %.  Assessment Abscess right third interspace with possible osteomyelitis third and fourth toes  Plan -Patient seen and examined. -X-ray imaging and MRI imaging reviewed and discussed with patient in detail showing abscess to the right third interspace as well as early onset osteomyelitis potentially to third and fourth toes. -Discussed abscess present and concern for early onset osteomyelitis. -Discussed treatment options for this issue. -All treatment options were discussed with the patient both conservative and surgical attempts at correction include potential risks and complications at this time patient is  elected for surgical intervention consisting of right foot incision and drainage with removal of all nonviable necrotic tissues including bone, bone biopsy/culture third and fourth toes at proximal phalanx bases.  No guarantees given.  Consent obtained. -Patient could potentially need long-term IV antibiotics depending on bone culture/path report.  Recommend infectious disease consultation tomorrow. -Patient may be partial weightbearing with heel contact and postop shoe. -Appreciate medicine recommendations for pain management and antibiotic therapy. -Patient to be n.p.o. at midnight for surgical intervention sometime tomorrow early afternoon.  Caroline More, DPM 10/21/2022, 9:39 PM

## 2022-10-21 NOTE — ED Notes (Signed)
Capillary refill <3 seconds in right foot, foot is cool- same temperature as left foot, redness and mild swelling noted, CMS is intact.

## 2022-10-21 NOTE — H&P (Signed)
History and Physical    Patient: Jacqueline Howell [id number] DOB: August 25, 1975 DOA: 10/21/2022 DOS: the patient was seen and examined on 10/21/2022 PCP: Rusty Aus, MD  Patient coming from: Home  Chief Complaint:  Chief Complaint  Patient presents with   Cellulitis    HPI: Jacqueline Howell is a 47 y.o. female with medical history significant for ADHD who underwent removal of a neuroma on her right foot in December 2023 with recurrent postoperative infections since January 2024, and treated with outpatient antibiotics near continuously since October 02 2022 who was sent from the podiatry office for admission for management of abscess and cellulitis of the right forefoot seen on outpatient MRI.  Patient stated that she was having fevers and had malaise and had redness and swelling on the dorsal aspect of the foot that improved only somewhat with the oral antibiotics. ED course and And data review: Vitals within normal limits.  CBC and CMP unremarkable.  Lactic acid pending. Outpatient MRI 3/28 showed abscess and osteomyelitis as follows: IMPRESSION: 1. Complex fluid collection between the third and fourth metatarsal heads and proximal phalanges concerning for an abscess. 2. Abnormal T1 and T2 signal intensity in the proximal phalanx of the fourth toe is consistent with osteomyelitis. 3. Similar but less significant signal abnormality in the proximal phalanx of the third toe could suggest early osteomyelitis. 4. Stable remote postsurgical changes involving the first metatarsal and first proximal phalanx. 5. No findings to suggest myofasciitis or pyomyositis.  Patient started on vancomycin and hospitalist consulted for admission.   Review of Systems: As mentioned in the history of present illness. All other systems reviewed and are negative.  Past Medical History:  Diagnosis Date   ADHD (attention deficit hyperactivity disorder)    Chronic constipation    Depression    IBS (irritable  bowel syndrome) 03/2018   Meniere disease    right ear   Vitamin D deficiency 05/2016   Past Surgical History:  Procedure Laterality Date   BLADDER SUSPENSION N/A 03/29/2019   Procedure: TRANSVAGINAL TAPE (TVT) PROCEDURE;  Surgeon: Gae Dry, MD;  Location: [facility]S;  Service: Gynecology;  Laterality: N/A;   COLONOSCOPY  04/24/2018   repeat due in 5 yrs; Dr. Aloha Gell REPAIR N/A 03/29/2019   Procedure: ANTERIOR REPAIR (CYSTOCELE);  Surgeon: Gae Dry, MD;  Location: [facility];  Service: Gynecology;  Laterality: N/A;   CYSTOSCOPY N/A 03/29/2019   Procedure: CYSTOSCOPY;  Surgeon: Gae Dry, MD;  Location: [facility];  Service: Gynecology;  Laterality: N/A;   DILATION AND CURETTAGE OF UTERUS     EXCISION MORTON'S NEUROMA Right 07/09/2022   Procedure: EXCISION MORTON'S NEUROMA;  Surgeon: Samara Deist, DPM;  Location: [facility];  Service: Podiatry;  Laterality: Right;   FOOT HARDWARE REMOVAL Right 10/06/2021   screw removed   METATARSAL OSTEOTOMY WITH BUNIONECTOMY Right 03/24/2021   Social History:  reports that she has never smoked. She has never used smokeless tobacco. She reports that she does not drink alcohol and does not use drugs.  Allergies  Allergen Reactions   Biaxin [Clarithromycin] Nausea And Vomiting and Other (See Comments)    Migraines, vomiting   Omnicef [Cefdinir] Hives    Family History  Problem Relation Age of Onset   Thyroid disease Mother    Meniere's disease Father    Colon cancer Father 58   Breast cancer Neg Hx     Prior to Admission medications   Medication Sig Start Date End  Date Taking? Authorizing Provider  lisdexamfetamine (VYVANSE) 50 MG capsule Take 50 mg by mouth daily.    [provider]  MULTIPLE VITAMIN PO Take 1 tablet by mouth daily.    [provider]  oxyCODONE-acetaminophen (PERCOCET) 5-325 MG tablet Take 1-2 tablets by mouth every 6 (six) hours as needed for severe pain. Max 6 tabs per day  07/09/22   Samara Deist, DPM  pramipexole (MIRAPEX) 0.25 MG tablet Take 0.25 mg by mouth at bedtime as needed.    [provider]    Physical Exam: Vitals:   10/21/22 1933 10/21/22 1942 10/21/22 2000  BP:  (!) 135/94 (!) 133/93  Pulse:  85 73  Resp:  18   Temp:  98.4 F (36.9 C)   TempSrc:  Oral   SpO2:  99%   Weight: 65.8 kg    Height: 5\' 6"  (1.676 m)     Physical Exam Vitals and nursing note reviewed.  Constitutional:      General: She is not in acute distress. HENT:     Head: Normocephalic and atraumatic.  Cardiovascular:     Rate and Rhythm: Normal rate and regular rhythm.     Heart sounds: Normal heart sounds.  Pulmonary:     Effort: Pulmonary effort is normal.     Breath sounds: Normal breath sounds.  Abdominal:     Palpations: Abdomen is soft.     Tenderness: There is no abdominal tenderness.  Musculoskeletal:     Comments: Redness dorsum right forefoot just proximal to third and fourth toes.  Very mild swelling  Neurological:     Mental Status: Mental status is at baseline.     Labs on Admission: I have personally reviewed following labs and imaging studies  CBC: Recent Labs  Lab 10/21/22 1949  WBC 7.4  NEUTROABS 3.7  HGB 14.1  HCT 42.3  MCV 86.0  PLT [id number]   Basic Metabolic Panel: Recent Labs  Lab 10/21/22 1949  NA 137  K 3.2*  CL 99  CO2 27  GLUCOSE 86  BUN 12  CREATININE 0.71  CALCIUM 9.0   GFR: Estimated Creatinine Clearance: 80.5 mL/min (by C-G formula based on SCr of 0.71 mg/dL). Liver Function Tests: Recent Labs  Lab 10/21/22 1949  AST 21  ALT 13  ALKPHOS 57  BILITOT 0.6  PROT 7.3  ALBUMIN 4.1   No results for input(s): "LIPASE", "AMYLASE" in the last 168 hours. No results for input(s): "AMMONIA" in the last 168 hours. Coagulation Profile: No results for input(s): "INR", "PROTIME" in the last 168 hours. Cardiac Enzymes: No results for input(s): "CKTOTAL", "CKMB", "CKMBINDEX", "TROPONINI" in the last 168  hours. BNP (last 3 results) No results for input(s): "PROBNP" in the last 8760 hours. HbA1C: No results for input(s): "HGBA1C" in the last 72 hours. CBG: No results for input(s): "GLUCAP" in the last 168 hours. Lipid Profile: No results for input(s): "CHOL", "HDL", "LDLCALC", "TRIG", "CHOLHDL", "LDLDIRECT" in the last 72 hours. Thyroid Function Tests: No results for input(s): "TSH", "T4TOTAL", "FREET4", "T3FREE", "THYROIDAB" in the last 72 hours. Anemia Panel: No results for input(s): "VITAMINB12", "FOLATE", "FERRITIN", "TIBC", "IRON", "RETICCTPCT" in the last 72 hours. Urine analysis:    Component Value Date/Time   COLORURINE STRAW 10/12/2011 [place] 10/12/2011 1322   LABSPEC 1.005 10/12/2011 1322   PHURINE 7.5 10/12/2011 1322   GLUCOSEU NEGATIVE 10/12/2011 1322   HGBUR NEGATIVE 10/12/2011 1322   BILIRUBINUR neg 06/23/2020 [place] 10/12/2011 1322  KETONESUR NEGATIVE 10/12/2011 1322   PROTEINUR Negative 06/23/2020 1529   PROTEINUR NEGATIVE 10/12/2011 1322   UROBILINOGEN 0.2 06/23/2020 1529   NITRITE neg 06/23/2020 1529   NITRITE NEGATIVE 10/12/2011 1322   LEUKOCYTESUR Small (1+) (A) 06/23/2020 1529   LEUKOCYTESUR NEGATIVE 10/12/2011 1322    Radiological Exams on Admission: MR FOOT RIGHT WO CONTRAST  Result Date: 10/21/2022 CLINICAL DATA:  Right foot infection.  History of prior surgery. EXAM: MRI OF THE RIGHT FOREFOOT WITHOUT CONTRAST TECHNIQUE: Multiplanar, multisequence MR imaging of the right foot was performed. No intravenous contrast was administered. COMPARISON:  Prior study 02/01/2022 FINDINGS: Complex fluid collection between the third and fourth metatarsal heads and proximal phalanges. Findings concerning for an abscess. There is also significant surrounding inflammatory changes. Abnormal T1 and T2 signal intensity in the proximal phalanx of the fourth toe is suspicious for osteomyelitis. No definite findings for septic arthritis.  Similar but less significant signal abnormality in the proximal phalanx of the third toe could suggest early osteomyelitis. Stable remote postsurgical changes involving the first metatarsal and first proximal phalanx. No findings to suggest myofasciitis or pyomyositis. IMPRESSION: 1. Complex fluid collection between the third and fourth metatarsal heads and proximal phalanges concerning for an abscess. 2. Abnormal T1 and T2 signal intensity in the proximal phalanx of the fourth toe is consistent with osteomyelitis. 3. Similar but less significant signal abnormality in the proximal phalanx of the third toe could suggest early osteomyelitis. 4. Stable remote postsurgical changes involving the first metatarsal and first proximal phalanx. 5. No findings to suggest myofasciitis or pyomyositis. Electronically Signed   By: Marijo Sanes M.D.   On: 10/21/2022 14:57     Data Reviewed: Relevant notes from primary care and specialist visits, past discharge summaries as available in EHR, including Care Everywhere. Prior diagnostic testing as pertinent to current admission diagnoses Updated medications and problem lists for reconciliation ED course, including vitals, labs, imaging, treatment and response to treatment Triage notes, nursing and pharmacy notes and ED provider's notes Notable results as noted in HPI    Assessment and Plan: * Cellulitis and abscess of toe of right foot Osteomyelitis right fourth and possible third toes History of resection of neuroma right foot December 2023 Rocephin and vancomycin Pain control Podiatry consult N.p.o. from midnight  ADHD (attention deficit hyperactivity disorder) Takes Adderall prn        DVT prophylaxis: SCD  Consults: [facility], Dr Luana Shu  Advance Care Planning: full code  Family Communication: none   Disposition Plan: Back to previous home environment  Severity of Illness: The appropriate patient status for this patient is INPATIENT.  Inpatient status is judged to be reasonable and necessary in order to provide the required intensity of service to ensure the patient's safety. The patient's presenting symptoms, physical exam findings, and initial radiographic and laboratory data in the context of their chronic comorbidities is felt to place them at high risk for further clinical deterioration. Furthermore, it is not anticipated that the patient will be medically stable for discharge from the hospital within 2 midnights of admission.   * I certify that at the point of admission it is my clinical judgment that the patient will require inpatient hospital care spanning beyond 2 midnights from the point of admission due to high intensity of service, high risk for further deterioration and high frequency of surveillance required.*  Author: Athena Masse, MD 10/21/2022 9:10 PM  For on call review [email or web address].

## 2022-10-21 NOTE — ED Provider Notes (Signed)
[facility] Provider Note    Event Date/Time   First MD Initiated Contact with Patient 10/21/22 1942     (approximate)   History   Foot infection   HPI  Jacqueline Howell is a 47 y.o. female who presents to the emergency department today after outpatient MRI is concerning for foot abscess and osteomyelitis.  The patient states that she has been having issues with that foot for a while and has had multiple procedures done. Recently it became red, swollen and hot. Had outpatient MRI which was concerning for abscess and osteomyelitis. Recommended to come to the emergency department by her podiatrist for IV antibiotics and procedure.       Physical Exam   Triage Vital Signs: ED Triage Vitals  Enc Vitals Group     BP 10/21/22 1942 (!) 135/94     Pulse Rate 10/21/22 1942 85     Resp 10/21/22 1942 18     Temp 10/21/22 1942 98.4 F (36.9 C)     Temp Source 10/21/22 1942 Oral     SpO2 10/21/22 1942 99 %     Weight 10/21/22 1933 145 lb (65.8 kg)     Height 10/21/22 1933 5\' 6"  (1.676 m)     Head Circumference --      Peak Flow --      Pain Score 10/21/22 1933 0     Pain Loc --      Pain Edu? --      Excl. in [place]? --     Most recent vital signs: Vitals:   10/21/22 1942  BP: (!) 135/94  Pulse: 85  Resp: 18  Temp: 98.4 F (36.9 C)  SpO2: 99%   General: Awake, alert, oriented. CV:  Good peripheral perfusion. Regular rate and rhythm. Resp:  Normal effort. Lungs clear. Abd:  No distention.    ED Results / Procedures / Treatments   Labs (all labs ordered are listed, but only abnormal results are displayed) Labs Reviewed  CBC WITH DIFFERENTIAL/PLATELET  COMPREHENSIVE METABOLIC PANEL  LACTIC ACID, PLASMA  LACTIC ACID, PLASMA     EKG  None   RADIOLOGY None    PROCEDURES:  Critical Care performed: No    MEDICATIONS ORDERED IN ED: Medications  0.9 %  sodium chloride infusion (has no administration in time range)  vancomycin  (VANCOCIN) IVPB 1000 mg/200 mL premix (has no administration in time range)     IMPRESSION / MDM / ASSESSMENT AND PLAN / ED COURSE  I reviewed the triage vital signs and the nursing notes.                              Differential diagnosis includes, but is not limited to, abscess, osteo  Patient's presentation is most consistent with acute presentation with potential threat to life or bodily function.  Patient presents to the emergency department today after outpatient MRI is concerning for osteo and abscess of her right foot.  On exam patient does have some erythema and warmth to the right distal foot.  Discussed with Dr. Luana Shu with podiatry recommended IV antibiotics and will plan on procedure tomorrow.  Dr. Damita Dunnings with the hospitalist service will plan on admission.  FINAL CLINICAL IMPRESSION(S) / ED DIAGNOSES   Final diagnoses:  Osteomyelitis, unspecified site, unspecified type [facility])     Note:  This document was prepared using Dragon voice recognition software and may include unintentional dictation errors.  Nance Pear, MD 10/21/22 2249

## 2022-10-21 NOTE — Assessment & Plan Note (Signed)
Osteomyelitis right fourth and possible third toes History of resection of neuroma right foot December 2023 Rocephin and vancomycin Pain control Podiatry consult N.p.o. from midnight

## 2022-10-21 NOTE — Progress Notes (Signed)
Pharmacy Antibiotic Note  Jacqueline Howell is a 47 y.o. female admitted on 10/21/2022 with cellulitis.  Pharmacy has been consulted for Vancomycin dosing.  Plan: Vancomycin 1 gm IV X 1 given in ED on 3/28 @ 1959.  Additional Vanc 500 mg IV X 1 ordered to make total loading dose of 1500 mg.   Vancomycin 1750 mg IV Q24H ordered to start on 3/29 @ 2000.  AUC = 518.6 Vanc trough = 9.4   Height: 5\' 6"  (167.6 cm) Weight: 65.8 kg (145 lb) IBW/kg (Calculated) : 59.3  Temp (24hrs), Avg:98.2 F (36.8 C), Min:98 F (36.7 C), Max:98.4 F (36.9 C)  Recent Labs  Lab 10/21/22 1949  WBC 7.4  CREATININE 0.71  LATICACIDVEN 0.7    Estimated Creatinine Clearance: 80.5 mL/min (by C-G formula based on SCr of 0.71 mg/dL).    Allergies  Allergen Reactions   Biaxin [Clarithromycin] Nausea And Vomiting and Other (See Comments)    Migraines, vomiting   Omnicef [Cefdinir] Hives    Antimicrobials this admission:   >>    >>   Dose adjustments this admission:   Microbiology results:  BCx:   UCx:    Sputum:    MRSA PCR:   Thank you for allowing pharmacy to be a part of this patient's care.  Ottis Vacha D 10/21/2022 10:17 PM

## 2022-10-21 NOTE — ED Triage Notes (Signed)
Patient ambulatory to triage with steady gait, without difficulty or distress noted; pt reports having neuroma removed in Dec by Dr Vickki Muff; infection developed in January that resolved with PO antibiotics; 3/9 infection developed again and cont to take doxycycline and omnicef; MRI performed today indicating osteomylitis and was sent over for surgery to be performed by Dr Luana Shu; no open wounds noted but redness to top of rt foot; denies pain, only reports itching

## 2022-10-21 NOTE — Assessment & Plan Note (Addendum)
Takes Adderall prn

## 2022-10-22 ENCOUNTER — Inpatient Hospital Stay: Payer: 59 | Admitting: Registered Nurse

## 2022-10-22 ENCOUNTER — Other Ambulatory Visit: Payer: Self-pay

## 2022-10-22 ENCOUNTER — Encounter
Admission: EM | Disposition: A | Discharge status: Home / Self Care | Payer: Self-pay | Source: Home / Self Care | Attending: Internal Medicine

## 2022-10-22 ENCOUNTER — Encounter: Payer: Self-pay | Admitting: Internal Medicine

## 2022-10-22 ENCOUNTER — Inpatient Hospital Stay: Payer: 59

## 2022-10-22 DIAGNOSIS — L03031 Cellulitis of right toe: Secondary | ICD-10-CM | POA: Diagnosis not present

## 2022-10-22 DIAGNOSIS — L02611 Cutaneous abscess of right foot: Secondary | ICD-10-CM | POA: Diagnosis not present

## 2022-10-22 HISTORY — PX: INCISION AND DRAINAGE: SHX5863

## 2022-10-22 LAB — SURGICAL PCR SCREEN
MRSA, PCR: NEGATIVE
Staphylococcus aureus: NEGATIVE

## 2022-10-22 LAB — PREGNANCY, URINE: Preg Test, Ur: NEGATIVE

## 2022-10-22 LAB — HIV ANTIBODY (ROUTINE TESTING W REFLEX): HIV Screen 4th Generation wRfx: NONREACTIVE

## 2022-10-22 SURGERY — INCISION AND DRAINAGE
Anesthesia: General | Site: Foot | Laterality: Right

## 2022-10-22 MED ORDER — LACTATED RINGERS IV SOLN: INTRAVENOUS | Status: DC | PRN

## 2022-10-22 MED ORDER — LIDOCAINE HCL (CARDIAC) PF 100 MG/5ML IV SOSY
PREFILLED_SYRINGE | INTRAVENOUS | Status: DC | PRN
  Administered 2022-10-22: 80 mg via INTRAVENOUS

## 2022-10-22 MED ORDER — OXYCODONE HCL 5 MG PO TABS
ORAL_TABLET | ORAL | Status: AC
  Filled 2022-10-22: qty 1

## 2022-10-22 MED ORDER — VANCOMYCIN HCL 1 G IV SOLR
INTRAVENOUS | Status: DC | PRN
  Administered 2022-10-22: 1000 mg

## 2022-10-22 MED ORDER — MUPIROCIN 2 % EX OINT
1.0000 | TOPICAL_OINTMENT | Freq: Two times a day (BID) | CUTANEOUS | Status: DC
  Administered 2022-10-22 – 2022-10-23 (×2): 1 via NASAL
  Filled 2022-10-22: qty 22

## 2022-10-22 MED ORDER — KETOROLAC TROMETHAMINE 15 MG/ML IJ SOLN
INTRAMUSCULAR | Status: AC
  Filled 2022-10-22: qty 1

## 2022-10-22 MED ORDER — FENTANYL CITRATE (PF) 100 MCG/2ML IJ SOLN
25.0000 ug | INTRAMUSCULAR | Status: DC | PRN
  Administered 2022-10-22: 25 ug via INTRAVENOUS
  Administered 2022-10-22: 50 ug via INTRAVENOUS
  Administered 2022-10-22: 25 ug via INTRAVENOUS

## 2022-10-22 MED ORDER — BUPIVACAINE HCL (PF) 0.5 % IJ SOLN
INTRAMUSCULAR | Status: DC | PRN
  Administered 2022-10-22: 10 mL

## 2022-10-22 MED ORDER — MIDAZOLAM HCL 2 MG/2ML IJ SOLN
INTRAMUSCULAR | Status: AC
  Filled 2022-10-22: qty 2

## 2022-10-22 MED ORDER — ACETAMINOPHEN 10 MG/ML IV SOLN
INTRAVENOUS | Status: DC | PRN
Start: 2022-10-22 — End: 2022-10-22
  Administered 2022-10-22: 1000 mg via INTRAVENOUS

## 2022-10-22 MED ORDER — LIDOCAINE HCL (PF) 2 % IJ SOLN
INTRAMUSCULAR | Status: AC
  Filled 2022-10-22: qty 5

## 2022-10-22 MED ORDER — CEFAZOLIN SODIUM-DEXTROSE 2-3 GM-%(50ML) IV SOLR
INTRAVENOUS | Status: DC | PRN
  Administered 2022-10-22: 2 g via INTRAVENOUS

## 2022-10-22 MED ORDER — PROPOFOL 1000 MG/100ML IV EMUL
INTRAVENOUS | Status: AC
  Filled 2022-10-22: qty 100

## 2022-10-22 MED ORDER — BUPIVACAINE HCL (PF) 0.5 % IJ SOLN
INTRAMUSCULAR | Status: AC
  Filled 2022-10-22: qty 30

## 2022-10-22 MED ORDER — PROPOFOL 500 MG/50ML IV EMUL
INTRAVENOUS | Status: DC | PRN
  Administered 2022-10-22: 75 ug/kg/min via INTRAVENOUS

## 2022-10-22 MED ORDER — OXYCODONE HCL 5 MG PO TABS
5.0000 mg | ORAL_TABLET | Freq: Once | ORAL | Status: AC
  Administered 2022-10-22: 5 mg via ORAL

## 2022-10-22 MED ORDER — CEFAZOLIN SODIUM-DEXTROSE 2-4 GM/100ML-% IV SOLN
INTRAVENOUS | Status: AC
  Filled 2022-10-22: qty 100

## 2022-10-22 MED ORDER — ACETAMINOPHEN 10 MG/ML IV SOLN
INTRAVENOUS | Status: AC
  Filled 2022-10-22: qty 100

## 2022-10-22 MED ORDER — VANCOMYCIN HCL 1000 MG IV SOLR
INTRAVENOUS | Status: AC
  Filled 2022-10-22: qty 20

## 2022-10-22 MED ORDER — DEXAMETHASONE SODIUM PHOSPHATE 10 MG/ML IJ SOLN
INTRAMUSCULAR | Status: DC | PRN
  Administered 2022-10-22: 5 mg via INTRAVENOUS

## 2022-10-22 MED ORDER — FENTANYL CITRATE (PF) 100 MCG/2ML IJ SOLN
INTRAMUSCULAR | Status: DC | PRN
  Administered 2022-10-22 (×2): 50 ug via INTRAVENOUS

## 2022-10-22 MED ORDER — PROPOFOL 10 MG/ML IV BOLUS
INTRAVENOUS | Status: DC | PRN
  Administered 2022-10-22 (×2): 50 mg via INTRAVENOUS
  Administered 2022-10-22: 20 mg via INTRAVENOUS

## 2022-10-22 MED ORDER — FENTANYL CITRATE (PF) 100 MCG/2ML IJ SOLN
INTRAMUSCULAR | Status: AC
  Filled 2022-10-22: qty 2

## 2022-10-22 MED ORDER — LIDOCAINE HCL (PF) 1 % IJ SOLN
INTRAMUSCULAR | Status: AC
  Filled 2022-10-22: qty 30

## 2022-10-22 MED ORDER — MIDAZOLAM HCL 2 MG/2ML IJ SOLN
INTRAMUSCULAR | Status: DC | PRN
  Administered 2022-10-22: 2 mg via INTRAVENOUS

## 2022-10-22 MED ORDER — ONDANSETRON HCL 4 MG/2ML IJ SOLN
INTRAMUSCULAR | Status: DC | PRN
  Administered 2022-10-22: 4 mg via INTRAVENOUS

## 2022-10-22 MED ORDER — KETOROLAC TROMETHAMINE 15 MG/ML IJ SOLN
15.0000 mg | Freq: Once | INTRAMUSCULAR | Status: AC
  Administered 2022-10-22: 15 mg via INTRAVENOUS

## 2022-10-22 MED ORDER — CHLORHEXIDINE GLUCONATE 4 % EX LIQD: 60.0000 mL | Freq: Once | CUTANEOUS | Status: DC

## 2022-10-22 MED ORDER — KETAMINE HCL 50 MG/5ML IJ SOSY
PREFILLED_SYRINGE | INTRAMUSCULAR | Status: AC
  Filled 2022-10-22: qty 5

## 2022-10-22 MED ORDER — KETAMINE HCL 10 MG/ML IJ SOLN
INTRAMUSCULAR | Status: DC | PRN
  Administered 2022-10-22: 30 mg via INTRAVENOUS
  Administered 2022-10-22: 20 mg via INTRAVENOUS

## 2022-10-22 MED ORDER — LIDOCAINE HCL (PF) 1 % IJ SOLN
INTRAMUSCULAR | Status: DC | PRN
  Administered 2022-10-22: 10 mL

## 2022-10-22 SURGICAL SUPPLY — 56 items
BAG COUNTER SPONGE SURGICOUNT (BAG) ×1 IMPLANT
BLADE OSC/SAGITTAL MD 5.5X18 (BLADE) IMPLANT
BLADE OSCILLATING/SAGITTAL (BLADE)
BLADE SW THK.38XMED LNG THN (BLADE) IMPLANT
BNDG ELASTIC 3X5.8 VLCR NS LF (GAUZE/BANDAGES/DRESSINGS) IMPLANT
BNDG ELASTIC 4X5.8 VLCR NS LF (GAUZE/BANDAGES/DRESSINGS) IMPLANT
BNDG ESMARCH 4 X 12 STRL LF (GAUZE/BANDAGES/DRESSINGS) ×1
BNDG ESMARCH 4X12 STRL LF (GAUZE/BANDAGES/DRESSINGS) ×1 IMPLANT
BNDG GAUZE DERMACEA FLUFF 4 (GAUZE/BANDAGES/DRESSINGS) IMPLANT
BNDG STRETCH GAUZE 3IN X12FT (GAUZE/BANDAGES/DRESSINGS) IMPLANT
CUP MEDICINE 2OZ PLAST GRAD ST (MISCELLANEOUS) IMPLANT
DURAPREP 26ML APPLICATOR (WOUND CARE) ×1 IMPLANT
ELECT REM PT RETURN 9FT ADLT (ELECTROSURGICAL) ×1
ELECTRODE REM PT RTRN 9FT ADLT (ELECTROSURGICAL) ×1 IMPLANT
GAUZE PACKING 0.25INX5YD STRL (GAUZE/BANDAGES/DRESSINGS) IMPLANT
GAUZE PAD ABD 8X10 STRL (GAUZE/BANDAGES/DRESSINGS) IMPLANT
GAUZE SPONGE 4X4 12PLY STRL (GAUZE/BANDAGES/DRESSINGS) ×1 IMPLANT
GAUZE STRETCH 2X75IN STRL (MISCELLANEOUS) IMPLANT
GAUZE XEROFORM 1X8 LF (GAUZE/BANDAGES/DRESSINGS) ×1 IMPLANT
GLOVE BIO SURGEON STRL SZ7 (GLOVE) ×1 IMPLANT
GLOVE INDICATOR 7.5 STRL GRN (GLOVE) ×1 IMPLANT
GOWN STRL REUS W/ TWL LRG LVL3 (GOWN DISPOSABLE) ×2 IMPLANT
GOWN STRL REUS W/TWL LRG LVL3 (GOWN DISPOSABLE) ×2
IV NS 1000ML (IV SOLUTION)
IV NS 1000ML BAXH (IV SOLUTION) IMPLANT
IV NS IRRIG 3000ML ARTHROMATIC (IV SOLUTION) IMPLANT
KIT TURNOVER KIT A (KITS) ×1 IMPLANT
LABEL OR SOLS (LABEL) IMPLANT
MANIFOLD NEPTUNE II (INSTRUMENTS) ×1 IMPLANT
NDL HYPO 25X1 1.5 SAFETY (NEEDLE) ×2 IMPLANT
NEEDLE HYPO 25X1 1.5 SAFETY (NEEDLE) ×2 IMPLANT
NS IRRIG 500ML POUR BTL (IV SOLUTION) ×1 IMPLANT
PACK EXTREMITY ARMC (MISCELLANEOUS) ×1 IMPLANT
PACKING GAUZE IODOFORM 1INX5YD (GAUZE/BANDAGES/DRESSINGS) IMPLANT
PAD ABD DERMACEA PRESS 5X9 (GAUZE/BANDAGES/DRESSINGS) IMPLANT
PULSAVAC PLUS IRRIG FAN TIP (DISPOSABLE) ×1
RASP SM TEAR CROSS CUT (RASP) IMPLANT
SOL PREP PVP 2OZ (MISCELLANEOUS)
SOLUTION PREP PVP 2OZ (MISCELLANEOUS) IMPLANT
STOCKINETTE STRL 6IN 960660 (GAUZE/BANDAGES/DRESSINGS) ×1 IMPLANT
SUT ETHILON 3-0 FS-10 30 BLK (SUTURE) ×1
SUT ETHILON 4-0 (SUTURE)
SUT ETHILON 4-0 FS2 18XMFL BLK (SUTURE)
SUT MNCRL 4-0 (SUTURE) ×1
SUT MNCRL 4-0 27XMFL (SUTURE) ×1
SUT VIC AB 3-0 SH 27 (SUTURE)
SUT VIC AB 3-0 SH 27X BRD (SUTURE) IMPLANT
SUT VIC AB 4-0 FS2 27 (SUTURE) IMPLANT
SUTURE EHLN 3-0 FS-10 30 BLK (SUTURE) IMPLANT
SUTURE ETHLN 4-0 FS2 18XMF BLK (SUTURE) IMPLANT
SUTURE MNCRL 4-0 27XMF (SUTURE) IMPLANT
SWAB CULTURE AMIES ANAERIB BLU (MISCELLANEOUS) IMPLANT
SYR 10ML LL (SYRINGE) ×1 IMPLANT
TIP FAN IRRIG PULSAVAC PLUS (DISPOSABLE) IMPLANT
TRAP FLUID SMOKE EVACUATOR (MISCELLANEOUS) ×1 IMPLANT
WATER STERILE IRR 500ML POUR (IV SOLUTION) ×1 IMPLANT

## 2022-10-22 NOTE — Progress Notes (Signed)
  Progress Note   Patient: Jacqueline Howell [id number] DOB: 15-Dec-1975 DOA: 10/21/2022     1 DOS: the patient was seen and examined on 10/22/2022    Subjective:  Patient seen and examined at bedside this morning Has some pain in the  right heel Denies nausea vomiting abdominal pain or respiratory distress Awaiting surgical intervention by podiatrist today  Brief hospital course: Jacqueline Howell is a 47 y.o. female with medical history significant for ADHD who underwent removal of a neuroma on her right foot in December 2023 with recurrent postoperative infections since January 2024, and treated with outpatient antibiotics near continuously since October 02 2022 who was sent from the podiatry office for admission for management of abscess and cellulitis of the right forefoot seen on outpatient MRI showed abscess and osteomyelitis   Assessment and Plan: *Acute osteomyelitis, cellulitis and abscess of toe of right foot Osteomyelitis right fourth and possible third toes History of resection of neuroma right foot December 2023 Continue current antibiotics Pain control Podiatry consult N.p.o. from midnight Factious disease has been consulted as requested by podiatrist We will await bone biopsy and culture results to guide antibiotic therapy   ADHD (attention deficit hyperactivity disorder) Takes Adderall prn    DVT prophylaxis: SCD   Consults: [facility], Dr Luana Shu   Advance Care Planning: full code   Family Communication: none    Disposition Plan: Back to previous home environment      Physical Exam: Vitals and nursing note reviewed.  Constitutional:      General: She is not in acute distress. HENT:     Head: Normocephalic and atraumatic.  Cardiovascular:     Rate and Rhythm: Normal rate and regular rhythm.  Pulmonary:     Effort: Pulmonary effort is normal.  Abdominal:     Palpations: Abdomen is soft.     Tenderness: There is no abdominal tenderness.  Musculoskeletal:      Comments: Erythema is much improved Neurological:     Mental Status: Mental status is at baseline.     Data Reviewed: CBC and BMP results reviewed by me today showing potassium of 3.2.  Pete BMP ordered  Family Communication: None at bedside  Disposition: Status is: Inpatient Patient continues to meet inpatient criteria given IV antibiotic need  Planned Discharge Destination: Home   Time spent: 40 minutes  Vitals:   10/21/22 2100 10/21/22 2148 10/22/22 0747 10/22/22 1201  BP: 132/88 (!) 132/95 129/89 122/72  Pulse: 75 82 78 67  Resp: 15 15 18 16   Temp:  98 F (36.7 C) 98.3 F (36.8 C) 98.1 F (36.7 C)  TempSrc:  Oral  Temporal  SpO2: 97% 100% 100% 100%  Weight:    65.8 kg  Height:    5\' 6"  (1.676 m)      Author: Verline Lema, MD 10/22/2022 2:01 PM  For on call review [email or web address].

## 2022-10-22 NOTE — Anesthesia Preprocedure Evaluation (Signed)
Anesthesia Evaluation  Patient identified by MRN, date of birth, ID band Patient awake    Reviewed: Allergy & Precautions, NPO status , Patient's Chart, lab work & pertinent test results  History of Anesthesia Complications Negative for: history of anesthetic complications  Airway Mallampati: II  TM Distance: >3 FB Neck ROM: full    Dental no notable dental hx. (+) Dental Advidsory Given   Pulmonary neg pulmonary ROS   Pulmonary exam normal        Cardiovascular Exercise Tolerance: Good negative cardio ROS Normal cardiovascular exam     Neuro/Psych  PSYCHIATRIC DISORDERS Anxiety Depression    negative neurological ROS     GI/Hepatic negative GI ROS, Neg liver ROS,,,  Endo/Other  negative endocrine ROS    Renal/GU negative Renal ROS  negative genitourinary   Musculoskeletal   Abdominal Normal abdominal exam  (+)   Peds  Hematology negative hematology ROS (+)   Anesthesia Other Findings Past Medical History: No date: ADHD (attention deficit hyperactivity disorder) No date: Chronic constipation No date: Depression 03/2018: IBS (irritable bowel syndrome) No date: Meniere disease     Comment:  right ear 05/2016: Vitamin D deficiency  Past Surgical History: 03/29/2019: BLADDER SUSPENSION; N/A     Comment:  Procedure: TRANSVAGINAL TAPE (TVT) PROCEDURE;  Surgeon:               Gae Dry, MD;  Location: [facility] ORS;  Service:               Gynecology;  Laterality: N/A; 04/24/2018: COLONOSCOPY     Comment:  repeat due in 5 yrs; Dr. Alfredia Ferguson 03/29/2019: CYSTOCELE REPAIR; N/A     Comment:  Procedure: ANTERIOR REPAIR (CYSTOCELE);  Surgeon:               Gae Dry, MD;  Location: [facility];  Service:               Gynecology;  Laterality: N/A; 03/29/2019: CYSTOSCOPY; N/A     Comment:  Procedure: CYSTOSCOPY;  Surgeon: Gae Dry, MD;                Location: [facility];  Service: Gynecology;  Laterality:                N/A; No date: DILATION AND CURETTAGE OF UTERUS 10/06/2021: FOOT HARDWARE REMOVAL; Right     Comment:  screw removed 03/24/2021: METATARSAL OSTEOTOMY WITH BUNIONECTOMY; Right     Reproductive/Obstetrics negative OB ROS                             Anesthesia Physical Anesthesia Plan  ASA: 2  Anesthesia Plan: General   Post-op Pain Management: Minimal or no pain anticipated   Induction: Intravenous  PONV Risk Score and Plan: 3 and Propofol infusion, TIVA, Treatment may vary due to age or medical condition and Midazolam  Airway Management Planned: Natural Airway and Simple Face Mask  Additional Equipment:   Intra-op Plan:   Post-operative Plan:   Informed Consent: I have reviewed the patients History and Physical, chart, labs and discussed the procedure including the risks, benefits and alternatives for the proposed anesthesia with the patient or authorized representative who has indicated his/her understanding and acceptance.     Dental Advisory Given  Plan Discussed with: Anesthesiologist, CRNA and Surgeon  Anesthesia Plan Comments:         Anesthesia Quick Evaluation

## 2022-10-22 NOTE — Consult Note (Signed)
NAME: Jacqueline Howell  DOB: [birth year]  MRN: [id number]  Date/Time: 10/22/2022 2:29 PM  REQUESTING PROVIDER: Dr. Luana Shu Subjective:  REASON FOR CONSULT: Right foot infection ? Jacqueline Howell is a 47 y.o. female with a history of multiple surgery to the right foot, ADHD, depression presented to the hospital for abnormal MRI of the right foot.  Patient has had multiple surgeries to the right foot Initially in March 2022 she had presented to [facility] for evaluation of right foot pain.  She had a right hallux valgus deformity with significant pain wearing shoes.  In August 2022 she underwent Rt hallux valgus correction and removal of bunionette, and Morton's neuroma of the right foot. Patient states after work that she had couple of times fluid aspirated from that foot.  Then in January 2023 she saw another podiatrist who recommended that she start walking to relieve the stiffness and using Hoka shoes.  Apparently sometime a  loose screw was removed #2023 she came to see [facility] clinic podiatrist for ongoing pain and swelling.  Dr. Vickki Muff took her for excision of the right third webspace neuroma on 07/09/2022.  As per patient the wound healed well after that. 07/26/22  She started using the treadmill at home and was doing fine until 08/21/2022 she noted pus in between the toes third and the fourth.   08/21/22   On 1/29/24She went to see Dr. Vickki Muff.  He suspected possible stitch abscess and did not see any purulence that day.  He prescribed Omnicef and fluconazole for 7 days... .  Patient did well after that and was doing fine until March 4 when he started noticing swelling as well as pain and redness.  She went to see Dr. Vickki Muff on 3/24 and was prescribed Omnicef.   She later was then prescribed doxycycline.  And afterwards asked to take both Omnicef and doxycycline .as the pain and swelling was not improving she asked for an MRI and it was done on 10/21/2022 and it showed complex fluid collection between the  third and fourth metatarsal heads and proximal phalanges concerning for an abscess.  An abnormal T1 and T2 signal intensity in the proximal phalanx of the fourth toe worrisome for osteomyelitis as well as in the proximal phalanx of the third toe.  She was asked to get admitted to the hospital.  Patient states she has not had any fever or chills except 1 day when she felt like as if she was having a flu and thinks it could be because of the leg She has 4 children and 3 lives with her at home. 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  But she does a lot of treadmill at home and even last Friday she was on the treadmill jogging and walking.  She has been very careful using Hoka shoes. Today patient was taken for surgery by Dr.Baker .  The procedure was right foot incision and drainage and removal of nonviable tissue.  And also underwent bone biopsy and culture of the right third and fourth toes. There was a large amount of scar tissue in the third interspace with no definite abscess present.  There was some sort of vascular like mass present on the plantar aspect of the third interspace.  And there appeared to be some soft bone on the medial aspect of the proximal phalanx of the right fourth toe and the third Toe appeared hard with no evidence of  osteomyelitis I am  asked to see the patient for antibiotic recommendation. Pt is currently on vanco and ceftriaxone Past Medical History:  Diagnosis Date   ADHD (attention deficit hyperactivity disorder)    Chronic constipation    Depression    IBS (irritable bowel syndrome) 03/2018   Meniere disease    right ear   Vitamin D deficiency 05/2016    Past Surgical History:  Procedure Laterality Date   BLADDER SUSPENSION N/A 03/29/2019   Procedure: TRANSVAGINAL TAPE (TVT) PROCEDURE;  Surgeon: Gae Dry, MD;  Location: [facility] ORS;  Service: Gynecology;  Laterality: N/A;   COLONOSCOPY  04/24/2018    repeat due in 5 yrs; Dr. Aloha Gell REPAIR N/A 03/29/2019   Procedure: ANTERIOR REPAIR (CYSTOCELE);  Surgeon: Gae Dry, MD;  Location: [facility] ORS;  Service: Gynecology;  Laterality: N/A;   CYSTOSCOPY N/A 03/29/2019   Procedure: CYSTOSCOPY;  Surgeon: Gae Dry, MD;  Location: [facility] ORS;  Service: Gynecology;  Laterality: N/A;   DILATION AND CURETTAGE OF UTERUS     EXCISION MORTON'S NEUROMA Right 07/09/2022   Procedure: EXCISION MORTON'S NEUROMA;  Surgeon: Samara Deist, DPM;  Location: [facility] ORS;  Service: Podiatry;  Laterality: Right;   FOOT HARDWARE REMOVAL Right 10/06/2021   screw removed   METATARSAL OSTEOTOMY WITH BUNIONECTOMY Right 03/24/2021    Social History   Socioeconomic History   Marital status: Married    Spouse name: Patent examiner   Number of children: 4   Years of education: Not on file   Highest education level: Not on file  Occupational History   Not on file  Tobacco Use   Smoking status: Never   Smokeless tobacco: Never  Vaping Use   Vaping Use: Never used  Substance and Sexual Activity   Alcohol use: No   Drug use: No   Sexual activity: Yes    Comment: Vasectomy  Other Topics Concern   Not on file  Social History Narrative   Not on file   Social Determinants of Health   Financial Resource Strain: Not on file  Food Insecurity: No Food Insecurity (10/21/2022)   Hunger Vital Sign    Worried About Running Out of Food in the Last Year: Never true    Ran Out of Food in the Last Year: Never true  Transportation Needs: No Transportation Needs (10/21/2022)   PRAPARE - Hydrologist (Medical): No    Lack of Transportation (Non-Medical): No  Physical Activity: Not on file  Stress: Not on file  Social Connections: Not on file  Intimate Partner Violence: Not At Risk (10/21/2022)   Humiliation, Afraid, Rape, and Kick questionnaire    Fear of Current or Ex-Partner: No    Emotionally Abused: No    Physically Abused: No     Sexually Abused: No    Family History  Problem Relation Age of Onset   Thyroid disease Mother    Meniere's disease Father    Colon cancer Father 38   Breast cancer Neg Hx    Allergies  Allergen Reactions   Biaxin [Clarithromycin] Nausea And Vomiting and Other (See Comments)    Migraines, vomiting   Omnicef [Cefdinir] Hives   I? Current Facility-Administered Medications  Medication Dose Route Frequency Provider Last Rate Last Admin   [MAR Hold] acetaminophen (TYLENOL) tablet 650 mg  650 mg Oral Q6H PRN Athena Masse, MD       Or   [facility] Hold] acetaminophen (TYLENOL) suppository 650 mg  650 mg Rectal Q6H PRN Athena Masse, MD       ceFAZolin (ANCEF) 2-4 GM/100ML-% IVPB            [MAR Hold] cefTRIAXone (ROCEPHIN) 1 g in sodium chloride 0.9 % 100 mL IVPB  1 g Intravenous Q24H Athena Masse, MD   Stopped at 10/21/22 2321   chlorhexidine (HIBICLENS) 4 % liquid 4 Application  60 mL Topical Once Caroline More, DPM       [facility] Hold] HYDROcodone-acetaminophen (NORCO/VICODIN) 5-325 MG per tablet 1-2 tablet  1-2 tablet Oral Q4H PRN Athena Masse, MD       lidocaine (PF) (XYLOCAINE) 1 % injection    PRN Caroline More, DPM   10 mL at 10/22/22 1331   [MAR Hold] morphine (PF) 2 MG/ML injection 2 mg  2 mg Intravenous Q2H PRN Athena Masse, MD   2 mg at 10/22/22 0724   mupirocin ointment (BACTROBAN) 2 % 1 Application  1 Application Nasal BID Verline Lema, MD       [facility] Hold] ondansetron [facility]) tablet 4 mg  4 mg Oral Q6H PRN Athena Masse, MD       Or   [facility] Hold] ondansetron [facility]) injection 4 mg  4 mg Intravenous Q6H PRN Athena Masse, MD       [facility] Hold] Oral care mouth rinse  15 mL Mouth Rinse PRN Athena Masse, MD       [MAR Hold] oxyCODONE-acetaminophen (PERCOCET/ROXICET) 5-325 MG per tablet 1-2 tablet  1-2 tablet Oral Q6H PRN Athena Masse, MD   1 tablet at 10/22/22 0043   [MAR Hold] vancomycin (VANCOREADY) IVPB 1750 mg/350 mL  1,750 mg Intravenous Q24H Athena Masse, MD        Facility-Administered Medications Ordered in Other Encounters  Medication Dose Route Frequency Provider Last Rate Last Admin   acetaminophen (OFIRMEV) IV   Intravenous Anesthesia Intra-op Johnna Acosta, CRNA   1,000 mg at 10/22/22 1407   ceFAZolin (ANCEF) IVPB 2 g/50 mL premix   Intravenous Anesthesia Intra-op Johnna Acosta, CRNA   2 g at 10/22/22 1330   dexamethasone (DECADRON) injection   Intravenous Anesthesia Intra-op Johnna Acosta, CRNA   5 mg at 10/22/22 1340   fentaNYL (SUBLIMAZE) injection   Intravenous Anesthesia Intra-op Johnna Acosta, CRNA   50 mcg at 10/22/22 1340   ketamine (KETALAR) injection   Intravenous Anesthesia Intra-op Johnna Acosta, CRNA   20 mg at 10/22/22 1405   lactated ringers infusion   Intravenous Continuous PRN Johnna Acosta, CRNA   New Bag at 10/22/22 1239   lidocaine (cardiac) 100 mg/42mL (XYLOCAINE) injection 2%   Intravenous Anesthesia Intra-op Johnna Acosta, CRNA   80 mg at 10/22/22 1326   midazolam (VERSED) injection   Intravenous Anesthesia Intra-op Johnna Acosta, CRNA   2 mg at 10/22/22 1315   ondansetron (ZOFRAN) injection   Intravenous Anesthesia Intra-op Johnna Acosta, CRNA   4 mg at 10/22/22 1340   propofol (DIPRIVAN) 10 mg/mL bolus/IV push   Intravenous Anesthesia Intra-op Johnna Acosta, CRNA   20 mg at 10/22/22 1405   propofol (DIPRIVAN) 500 MG/50ML infusion   Intravenous Continuous PRN Johnna Acosta, CRNA   Stopped at 10/22/22 1426     Abtx:  Anti-infectives (From admission, onward)    Start     Dose/Rate Route Frequency Ordered Stop   10/22/22 2000  vancomycin (VANCOREADY) IVPB 1750 mg/350 mL  Status:  Discontinued        1,750 mg  175 mL/hr over 120 Minutes Intravenous Every 24 hours 10/21/22 2216 10/21/22 2219   10/22/22 2000  [MAR Hold]  vancomycin (VANCOREADY) IVPB 1750 mg/350 mL        (MAR Hold since Fri 10/22/2022 at 1155.Hold Reason: Transfer to a Procedural area)   1,750  mg 175 mL/hr over 120 Minutes Intravenous Every 24 hours 10/21/22 2219 10/28/22 1959   10/22/22 1306  ceFAZolin (ANCEF) 2-4 GM/100ML-% IVPB       Note to Pharmacy: Doreen Salvage J: cabinet override      10/22/22 1306 10/23/22 0114   10/21/22 2245  vancomycin (VANCOREADY) IVPB 500 mg/100 mL        500 mg 100 mL/hr over 60 Minutes Intravenous  Once 10/21/22 2152 10/22/22 0147   10/21/22 2145  [MAR Hold]  cefTRIAXone (ROCEPHIN) 1 g in sodium chloride 0.9 % 100 mL IVPB        (MAR Hold since Fri 10/22/2022 at 1155.Hold Reason: Transfer to a Procedural area)   1 g 200 mL/hr over 30 Minutes Intravenous Every 24 hours 10/21/22 2140     10/21/22 2000  vancomycin (VANCOCIN) IVPB 1000 mg/200 mL premix        1,000 mg 200 mL/hr over 60 Minutes Intravenous  Once 10/21/22 1953 10/21/22 2115       REVIEW OF SYSTEMS:  Const: negative fever, negative chills, negative weight loss Eyes: negative diplopia or visual changes, negative eye pain ENT: negative coryza, negative sore throat Resp: negative cough, hemoptysis, dyspnea Cards: negative for chest pain, palpitations, lower extremity edema GU: negative for frequency, dysuria and hematuria GI: Negative for abdominal pain, diarrhea, bleeding, constipation Skin: negative for rash and pruritus Heme: negative for easy bruising and gum/nose bleeding MS: Right foot pain and swelling Neurolo:negative for headaches, dizziness, vertigo, memory problems  Psych: negative for feelings of anxiety, depression  Endocrine: negative for thyroid, diabetes Allergy/Immunology- negative for any medication or food allergies Just reported that she has Omnicef and Biaxin allergy The Omnicef she states was many years ago but she has tolerated it without any problem in March 2024 for almost 3 weeks now. Biaxin was GI symptoms Objective:  VITALS:  BP 122/72   Pulse 67   Temp 98.1 F (36.7 C) (Temporal)   Resp 16   Ht 5\' 6"  (1.676 m)   Wt 65.8 kg   LMP 10/21/2022  (Exact Date)   SpO2 100%   BMI 23.41 kg/m   PHYSICAL EXAM:  General: Alert, cooperative, no distress, appears stated age.  Head: Normocephalic, without obvious abnormality, atraumatic. Eyes: Conjunctivae clear, anicteric sclerae. Pupils are equal ENT Nares normal. No drainage or sinus tenderness. Lips, mucosa, and tongue normal. No Thrush Neck: Supple, symmetrical, no adenopathy, thyroid: non tender no carotid bruit and no JVD. Back: No CVA tenderness. Lungs: Clear to auscultation bilaterally. No Wheezing or Rhonchi. No rales. Heart: Regular rate and rhythm, no murmur, rub or gallop. Abdomen: Soft, non-tender,not distended. Bowel sounds normal. No masses Extremities: Right foot surgical dressing not removed  skin: No rashes or lesions. Or bruising Lymph: Cervical, supraclavicular normal. Neurologic: Grossly non-focal Pertinent Labs Lab Results CBC    Component Value Date/Time   WBC 7.4 10/21/2022 1949   RBC 4.92 10/21/2022 1949   HGB 14.1 10/21/2022 1949   HGB 14.1 01/14/2022 1031   HCT 42.3 10/21/2022 1949   HCT 41.7 01/14/2022 1031   PLT 235 10/21/2022 1949   PLT 244 01/14/2022 1031   MCV 86.0 10/21/2022 1949   MCV 86  01/14/2022 1031   MCV 88 11/21/2013 0701   MCH 28.7 10/21/2022 1949   MCHC 33.3 10/21/2022 1949   RDW 12.4 10/21/2022 1949   RDW 13.2 01/14/2022 1031   RDW 13.5 11/21/2013 0701   LYMPHSABS 3.2 10/21/2022 1949   LYMPHSABS 3.0 01/14/2022 1031   MONOABS 0.3 10/21/2022 1949   EOSABS 0.1 10/21/2022 1949   EOSABS 0.1 01/14/2022 1031   BASOSABS 0.1 10/21/2022 1949   BASOSABS 0.1 01/14/2022 1031       Latest Ref Rng & Units 10/21/2022    7:49 PM 01/14/2022   10:31 AM 09/19/2018   11:49 AM  CMP  Glucose 70 - 99 mg/dL 86  80  84   BUN 6 - 20 mg/dL 12  11  7    Creatinine 0.44 - 1.00 mg/dL 0.71  0.85  0.89   Sodium 135 - 145 mmol/L 137  138  139   Potassium 3.5 - 5.1 mmol/L 3.2  4.1  4.1   Chloride 98 - 111 mmol/L 99  100  101   CO2 22 - 32 mmol/L 27   25  21    Calcium 8.9 - 10.3 mg/dL 9.0  9.5  9.3   Total Protein 6.5 - 8.1 g/dL 7.3  7.0  6.9   Total Bilirubin 0.3 - 1.2 mg/dL 0.6  0.2  0.2   Alkaline Phos 38 - 126 U/L 57  54  70   AST 15 - 41 U/L 21  20  31    ALT 0 - 44 U/L 13  14  22        Microbiology: No results found for this or any previous visit (from the past 240 hour(s)).  IMAGING RESULTS: Reviewed MRI films There was a complex fluid in the third and fourth webspace. I have personally reviewed the films ? Impression/Recommendation Patient recently had Morton's neuroma removed in December 2023.  The wound healed well but a few weeks later she had some redness and in March she had swelling and redness and has been on antibiotics.  Patient presenting with right foot infection Because MRI had shown a complex fluid collection in the third space.  On surgery this was scar tissue Patient has been on antibiotics since the past 2 weeks.  She has been on Omnicef and doxycycline Today she underwent I&D and removal of scar tissue and biopsy of the third and fourth toe and sent for pathology as well as culture  Patient has had multiple surgeries to the right foot.  Initially started as hallux valgus correction as well as multiple bunionette's removal She has had swelling and pain intermittently the last 2 years She has not been wearing any compression wear on that foot. She is pretty active on her feet patient is currently on vancomycin and ceftriaxone She is asking to go home The culture and biopsy may not be back for many days From my point, the patient is very stable With no evidence of systemic infection and hence she could go on p.o. Augmentin and trimethoprim/sulfamethoxazole which is Bactrim double strength 1 twice daily and Augmentin 875 mg p.o. twice daily for 1 week.  Will follow the culture and pathology result as outpatient and then decide whether she will need IV antibiotics Will also send ESR and CRP.  Discussed with Dr.  Luana Shu.  He will look at her tomorrow and decide. I am not on-call this weekend and [facility]  will be covering if needed. I explained to the patient in great detail. Discussed  with the requesting provider as well.  _______________________________________________ Discussed with patient, requesting provider Note:  This document was prepared using Dragon voice recognition software and may include unintentional dictation errors.

## 2022-10-22 NOTE — Transfer of Care (Addendum)
Immediate Anesthesia Transfer of Care Note  Patient: Jacqueline Howell  Procedure(s) Performed: RIGHT FOOT INCISION AND DRAINAGE (Right: Foot)  Patient Location: PACU  Anesthesia Type:General  Level of Consciousness: sedated  Airway & Oxygen Therapy: Patient Spontanous Breathing and Patient connected to face mask oxygen  Post-op Assessment: Report given to RN and Post -op Vital signs reviewed and stable  Post vital signs: Reviewed and stable  Last Vitals:  Vitals Value Taken Time  BP 86/43 10/22/22 1436  Temp    Pulse 60 10/22/22 1442  Resp 24 10/22/22 1442  SpO2 99 % 10/22/22 1442  Vitals shown include unvalidated device data.  Last Pain:  Vitals:   10/22/22 1201  TempSrc: Temporal  PainSc: 7       Patients Stated Pain Goal: 0 ([phone] [id number])  Complications: No notable events documented.

## 2022-10-22 NOTE — Anesthesia Procedure Notes (Signed)
Date/Time: 10/22/2022 1:20 PM  Performed by: Johnna Acosta, CRNAPre-anesthesia Checklist: Patient identified, Emergency Drugs available, Suction available, Patient being monitored and Timeout performed Patient Re-evaluated:Patient Re-evaluated prior to induction Oxygen Delivery Method: Simple face mask Preoxygenation: Pre-oxygenation with 100% oxygen Induction Type: IV induction

## 2022-10-22 NOTE — H&P (Signed)
HISTORY AND PHYSICAL INTERVAL NOTE:  10/22/2022  1:10 PM  Jacqueline Howell  has presented today for surgery, with the diagnosis of RIGHT FOOT ABSCESS, POSSIBLE OSTEOMYELITIS 3-4 TOESThe various methods of treatment have been discussed with the patient.  No guarantees were given.  After consideration of risks, benefits and other options for treatment, the patient has consented to surgery.  I have reviewed the patients' chart and labs.    PROCEDURE: ALL RIGHT FOOT INCISION AND DRAINAGE RIGHT FOOT POSSIBLE APPLICATION OF ABX BEADS BONE BIOPSIES 3-4 TOES  A history and physical examination was performed in the hospital.  The patient was reexamined.  There have been no changes to this history and physical examination.  Caroline More, DPM

## 2022-10-22 NOTE — Op Note (Signed)
PODIATRY / FOOT AND ANKLE SURGERY OPERATIVE REPORT    SURGEON: Caroline More, DPM  PRE-OPERATIVE DIAGNOSIS:  1.  Possible abscess right third interspace status post neuroma resection 2.  Possible osteomyelitis right third and fourth toes  POST-OPERATIVE DIAGNOSIS: Same  PROCEDURE(S): Right foot incision and drainage with removal of all nonviable necrotic tissues Bone biopsy/culture right third and fourth toes  HEMOSTASIS: Right ankle tourniquet  ANESTHESIA: MAC  ESTIMATED BLOOD LOSS: 30 cc  FINDING(S): 1.  Large amount of scar tissue present to the right third interspace.  No definitive abscess present. 2.  Some sort of vascular like mass present to the plantar aspect of the third interspace 3.  Appeared to have somewhat soft bone to the medial aspect of the proximal phalanx base of the right fourth toe, third toe appeared to be very hard with no evidence of osteomyelitis.  PATHOLOGY/SPECIMEN(S):   Tissue swab/culture Right third toe bone biopsy Right third toe bone culture Right fourth toe bone biopsy Right fourth toe bone culture Soft tissue mass right foot  INDICATIONS:   Jacqueline Howell is a 47 y.o. female who presents with recurrent cellulitic changes to the right forefoot status post a neuroma resection with Dr. Vickki Muff in December 2023.  Patient was subsequently treated with oral antibiotics for a prolonged period of time which improved the situation.  Patient has had a recurrent infection now come and go and most recently had an MRI performed on an outpatient basis which showed possible osteomyelitis to the third and fourth toes as well as likely third interspace abscess.  Patient was advised to go to the hospital for admission and IV antibiotics with likely incision and drainage with exploration of tissues.  Patient was admitted and vital signs appear to be stable with lab work within normal limits.  All treatment options were discussed with the patient both conservative and  surgical attempts at correction include potential risks and complications at this time patient is elected for procedure consisting of right foot incision and drainage with removal of all nonviable necrotic tissues with bone biopsies of the third and fourth toes with culturing, possible antibiotic bead application.  DESCRIPTION: After obtaining full informed written consent, the patient was brought back to the operating room and placed supine upon the operating table.  The patient received IV antibiotics prior to induction.  After obtaining adequate anesthesia, the patient was prepped and draped in the standard fashion.  10 cc of 1% lidocaine plain was injected about the operative area at the third interspace as well as digital blocks to the third and fourth toes.  An Esmarch bandage was used to exsanguinate the right lower extremity and the pneumatic ankle tourniquet was inflated.  Attention was directed to the right third interspace.  An incision was made directly over the same site where patient had the neuroma resection.  The incision was also extended up the medial aspect of the fourth toe to the proximal phalanx base.  The incision was deepened through the subcutaneous tissues utilizing sharp and blunt dissection and care was taken to identify and retract all vital neural and vascular structures and all venous contributories were cauterized as necessary.  There appeared to be a large amount of scar tissue present to the area likely from the patient's previous surgical intervention.  The scar tissue was resected as much as possible and passed off the operative site.  There is no definitive abscess present to the area, no overt signs of infection present to the  tissues.  There did appear to be some fatty type of liquefactive necrosis to some of the fat at the interspace which was resected.  While assessing this area further there did appear to be a vascular type of mass present within the interspace.  This  vascular type mass was circumferentially dissected and resected and passed off the operative site and cauterized.  The soft tissue mass was sent to pathology.  A tissue swab was taken and sent off for culture.  The surgical site was flushed with copious amounts normal sterile saline with 3 L of normal sterile saline with pulse lavage.  The tissues were examined yet further at the third interspace level and no further abnormality was present.  Did not see any residual neuroma present to the area as well.  At this time a small periosteal incision was made into the medial base of the right fourth toe.  Utilizing a sterile rongeur a bone sample was taken from this area.  The bone sample was then divided into and passed off from the operative site and sent off to pathology and culture.  The same thing was then performed to the right third toe.  The right fourth toe overall appeared to be softer in nature when taken the biopsy.  The third toe appeared to be hard and had no evidence of osteomyelitis present.  Concern for potential osteomyelitis to the right fourth toe due to soft nature of bone.  The surgical site was flushed with copious amounts normal sterile saline again.  Patient had a fair amount of movement during the procedure potentially related to light sedation but also related to tourniquet pain.  The tourniquet was released and a prompt hyperemic response was noted to all digits of the right foot.  Any further areas were cauterized that were bleeding and hemostasis was achieved.  The wound was then packed with vancomycin powder.  The subcutaneous tissues were reapproximated well coapted with 4-0 Monocryl and the skin was then reapproximated well coapted with 3-0 nylon combination of simple and horizontal mattress type stitching.  Hemostasis appeared to be well achieved overall.  A postoperative dressing was then applied consisting of Xeroform to the incisional area followed by 4 x 4 gauze, gauze roll,  ABD, Ace wrap.  The patient tolerated the procedure and anesthesia well was transferred to recovery room vital signs stable vascular status intact all toes the right foot.  Following a period of  postoperative monitoring the patient will be discharged back to the inpatient room with the appropriate orders, instructions, and medications.  Patient should try to stay for the most part nonweightbearing at least for the next 24 hours to make sure minimal bleeding occurs.  May be partial weightbearing with heel contact and surgical shoe for very short distances.  COMPLICATIONS: none  CONDITION: good, stable  Caroline More, DPM

## 2022-10-22 NOTE — Plan of Care (Signed)

## 2022-10-22 NOTE — Progress Notes (Signed)
Pharmacy Antibiotic Note  Jacqueline Howell is a 47 y.o. female admitted on 10/21/2022 with cellulitis.  Pharmacy has been consulted for Vancomycin dosing. MRI reveals possible OM, Podiatry consulted.  Plan:  Continue vancomycin 1750 mg IV Q24H   AUC = 518.6 Vanc trough = 9.4   Height: 5\' 6"  (167.6 cm) Weight: 65.8 kg (145 lb) IBW/kg (Calculated) : 59.3  Temp (24hrs), Avg:98.2 F (36.8 C), Min:98 F (36.7 C), Max:98.4 F (36.9 C)  Recent Labs  Lab 10/21/22 1949  WBC 7.4  CREATININE 0.71  LATICACIDVEN 0.7     Estimated Creatinine Clearance: 80.5 mL/min (by C-G formula based on SCr of 0.71 mg/dL).    Allergies  Allergen Reactions   Biaxin [Clarithromycin] Nausea And Vomiting and Other (See Comments)    Migraines, vomiting   Omnicef [Cefdinir] Hives    Antimicrobials this admission: 3/28 Vancomycin  >>  3/28 CRO  >>   Dose adjustments this admission: N/A  Microbiology results: None  Thank you for allowing pharmacy to be a part of this patient's care.  Alison Murray 10/22/2022 11:21 AM

## 2022-10-22 NOTE — TOC Transition Note (Signed)
Transition of Care [facility]) - CM/SW Discharge Note   Patient Details  Name: Jacqueline Howell MRN: [id number] Date of Birth: [birth year]  Transition [facility] Care [facility]) CM/SW Contact:  Conception Oms, RN Phone Number: 10/22/2022, 11:06 AM   Clinical Narrative:    The patient comes from home where she lives with her Spouse, Admitted for IV ABX and Procedure to include surgical intervention consisting of right foot incision and drainage with removal of all nonviable necrotic tissues including bone, bone biopsy/culture third and fourth toes at proximal phalanx bases.  She is employed at [employer] PCP [facility]   Barriers to Discharge: Continued Medical Work up   Patient Goals and CMS Choice      Discharge Placement                         Discharge Plan and Services Additional resources added to the After Visit Summary for     Discharge Planning Services: CM Consult                                 Social Determinants of Health (SDOH) Interventions SDOH Screenings   Food Insecurity: No Food Insecurity (10/21/2022)  Housing: [place]  (10/21/2022)  Transportation Needs: No Transportation Needs (10/21/2022)  Utilities: Not At Risk (10/21/2022)  Tobacco Use: Low Risk  (10/21/2022)     Readmission Risk Interventions     No data to display

## 2022-10-23 DIAGNOSIS — L02611 Cutaneous abscess of right foot: Secondary | ICD-10-CM | POA: Diagnosis not present

## 2022-10-23 DIAGNOSIS — L03031 Cellulitis of right toe: Secondary | ICD-10-CM | POA: Diagnosis not present

## 2022-10-23 LAB — CBC WITH DIFFERENTIAL/PLATELET
Abs Immature Granulocytes: 0.02 10*3/uL (ref 0.00–0.07)
Basophils Absolute: 0 10*3/uL (ref 0.0–0.1)
Basophils Relative: 0 %
Eosinophils Absolute: 0 10*3/uL (ref 0.0–0.5)
Eosinophils Relative: 0 %
HCT: 37.8 % (ref 36.0–46.0)
Hemoglobin: 12.9 g/dL (ref 12.0–15.0)
Immature Granulocytes: 0 %
Lymphocytes Relative: 13 %
Lymphs Abs: 1.2 10*3/uL (ref 0.7–4.0)
MCH: 28.9 pg (ref 26.0–34.0)
MCHC: 34.1 g/dL (ref 30.0–36.0)
MCV: 84.6 fL (ref 80.0–100.0)
Monocytes Absolute: 0.4 10*3/uL (ref 0.1–1.0)
Monocytes Relative: 4 %
Neutro Abs: 8 10*3/uL — ABNORMAL HIGH (ref 1.7–7.7)
Neutrophils Relative %: 83 %
Platelets: 237 10*3/uL (ref 150–400)
RBC: 4.47 MIL/uL (ref 3.87–5.11)
RDW: 12.2 % (ref 11.5–15.5)
WBC: 9.7 10*3/uL (ref 4.0–10.5)
nRBC: 0 % (ref 0.0–0.2)

## 2022-10-23 LAB — BASIC METABOLIC PANEL
Anion gap: 11 (ref 5–15)
BUN: 15 mg/dL (ref 6–20)
CO2: 24 mmol/L (ref 22–32)
Calcium: 8.9 mg/dL (ref 8.9–10.3)
Chloride: 99 mmol/L (ref 98–111)
Creatinine, Ser: 0.7 mg/dL (ref 0.44–1.00)
GFR, Estimated: >60 mL/min (ref >60)
Glucose, Bld: 118 mg/dL — ABNORMAL HIGH (ref 70–99)
Potassium: 3.9 mmol/L (ref 3.5–5.1)
Sodium: 134 mmol/L — ABNORMAL LOW (ref 135–145)

## 2022-10-23 LAB — SEDIMENTATION RATE: Sed Rate: 9 mm/hr (ref 0–20)

## 2022-10-23 MED ORDER — AMOXICILLIN-POT CLAVULANATE 875-125 MG PO TABS: 1.0000 | ORAL_TABLET | Freq: Two times a day (BID) | ORAL | 0 refills | Status: DC

## 2022-10-23 MED ORDER — SULFAMETHOXAZOLE-TRIMETHOPRIM 800-160 MG PO TABS: 1.0000 | ORAL_TABLET | Freq: Two times a day (BID) | ORAL | 0 refills | Status: DC

## 2022-10-23 MED ORDER — OXYCODONE HCL 5 MG PO TABS: 5.0000 mg | ORAL_TABLET | Freq: Four times a day (QID) | ORAL | 0 refills | Status: AC | PRN

## 2022-10-23 MED ORDER — SULFAMETHOXAZOLE-TRIMETHOPRIM 800-160 MG PO TABS: 1.0000 | ORAL_TABLET | Freq: Two times a day (BID) | ORAL | Status: DC

## 2022-10-23 MED ORDER — AMOXICILLIN-POT CLAVULANATE 875-125 MG PO TABS: 1.0000 | ORAL_TABLET | Freq: Two times a day (BID) | ORAL | Status: DC

## 2022-10-23 NOTE — Discharge Summary (Signed)
Physician Discharge Summary   Patient: Jacqueline Howell MRN: [id number] DOB: 02-Oct-1975  Admit date:     10/21/2022  Discharge date: 10/23/22  Discharge Physician: Verline Lema   PCP: Rusty Aus, MD   Recommendations at discharge:  Follow-up with podiatrist as well as infectious disease doctor  Discharge Diagnoses: Acute osteomyelitis, cellulitis and abscess of toe of right foot ADHD (attention deficit hyperactivity disorder)   Hospital Course: Jacqueline Howell is a 47 y.o. female with medical history significant for ADHD who underwent removal of a neuroma on her right foot in December 2023 with recurrent postoperative infections since January 2024, and treated with outpatient antibiotics near continuously since October 02 2022 who was sent from the podiatry office for admission for management of abscess and cellulitis of the right forefoot seen on outpatient MRI showed abscess and osteomyelitis. Patient is s/p incision and drainage by podiatry recs as well as bone biopsy and culture.  Was seen by infectious disease who recommended to take Bactrim DS as well as Augmentin and to follow-up as an outpatient.  ID plans to monitor culture results and adjust antibiotic therapy.  Since patient would like to go home infectious disease have given this plan for now.    Consultants: Podiatry infectious disease Procedures performed: As mentioned above Disposition: Home Diet recommendation:  Discharge Diet Orders (From admission, onward)     Start     Ordered   10/23/22 0000  Diet - low sodium heart healthy        10/23/22 1213           Regular diet DISCHARGE MEDICATION: Allergies as of 10/23/2022       Reactions   Biaxin [clarithromycin] Nausea And Vomiting, Other (See Comments)   Migraines, vomiting   Omnicef [cefdinir] Hives   She tolerates with no problem - march 2024- so there is no allergy   Oxycodone-acetaminophen    Other Reaction(s): Hallucination   Trazodone Other (See  Comments)   Hypersomnia        Medication List     STOP taking these medications    cefdinir 300 MG capsule Commonly known as: OMNICEF   doxycycline 100 MG tablet Commonly known as: VIBRA-TABS   lisdexamfetamine 50 MG capsule Commonly known as: VYVANSE   oxyCODONE-acetaminophen 5-325 MG tablet Commonly known as: Percocet   traMADol 50 MG tablet Commonly known as: ULTRAM       TAKE these medications    amoxicillin-clavulanate 875-125 MG tablet Commonly known as: AUGMENTIN Take 1 tablet by mouth 2 (two) times daily.   amphetamine-dextroamphetamine 10 MG 24 hr capsule Commonly known as: ADDERALL XR Take 10 mg by mouth daily.   LORazepam 1 MG tablet Commonly known as: ATIVAN Take 1 mg by mouth daily as needed.   MULTIPLE VITAMIN PO Take 1 tablet by mouth daily.   oxyCODONE 5 MG immediate release tablet Commonly known as: Roxicodone Take 1 tablet (5 mg total) by mouth every 6 (six) hours as needed for up to 5 days for severe pain.   pramipexole 0.25 MG tablet Commonly known as: MIRAPEX Take 0.25 mg by mouth at bedtime as needed.   sulfamethoxazole-trimethoprim 800-160 MG tablet Commonly known as: BACTRIM DS Take 1 tablet by mouth 2 (two) times daily.        Follow-up Information     Caroline More, DPM. Schedule an appointment as soon as possible for a visit in 1 week(s).   Specialty: Podiatry Why: For wound re-check Contact information:  [place] [address] [phone]         Tsosie Billing, MD. Schedule an appointment as soon as possible for a visit in 2 week(s).   Specialty: Infectious Diseases Contact information: [place] [address] [phone]                Discharge Exam: Danley Danker Weights   10/21/22 1933 10/22/22 1201  Weight: 65.8 kg 65.8 kg   Vitals and nursing note reviewed.  Constitutional:      General: She is not in acute distress. HENT:     Head:  Normocephalic and atraumatic.  Cardiovascular:     Rate and Rhythm: Normal rate and regular rhythm.  Pulmonary:     Effort: Pulmonary effort is normal.  Abdominal:     Palpations: Abdomen is soft.     Tenderness: There is no abdominal tenderness.  Musculoskeletal:     Comments: Erythema is much improved Neurological:     Mental Status: Mental status is at baseline.   Condition at discharge: good  Discharge time spent: greater than 30 minutes.  Signed: Verline Lema, MD [facility] 10/23/2022

## 2022-10-23 NOTE — Discharge Instructions (Signed)
[facility]  POST OPERATIVE INSTRUCTIONS FOR DR. FOWLER AND DR. Josephina Melcher [facility]   Take your medication as prescribed.  Pain medication should be taken only as needed.  Keep the dressing clean, dry and intact.  Keep your foot elevated above the heart level for the first 48 hours.  Walking to the bathroom and brief periods of walking are acceptable, unless we have instructed you to be non-weight bearing.  Always wear your post-op shoe when walking.  Always use your crutches if you are to be non-weight bearing.  Do not take a shower. Baths are permissible as long as the foot is kept out of the water.   Every hour you are awake:  Bend your knee 15 times. Flex foot 15 times Massage calf 15 times  Call [facility] ([phone]) if any of the following problems occur: You develop a temperature or fever. The bandage becomes saturated with blood. Medication does not stop your pain. Injury of the foot occurs. Any symptoms of infection including redness, odor, or red streaks running from wound. 

## 2022-10-23 NOTE — Progress Notes (Signed)
PODIATRY / FOOT AND ANKLE SURGERY PROGRESS NOTE  Requesting Physician: Dr. Damita Dunnings  Reason for consult: Right foot infection  Chief Complaint: Right foot infection/swelling/redness and pain   HPI: Jacqueline Howell is a 47 y.o. female who presents for follow-up status post 1 day incision and drainage right foot with bone biopsies of third and fourth toes.  Patient has some pain to the area but is controlled with pain medication and Tylenol.  Patient is kept dressings clean, dry, and intact since procedure and has been weightbearing with heel contact and surgical shoe.  PMHx:  Past Medical History:  Diagnosis Date   ADHD (attention deficit hyperactivity disorder)    Chronic constipation    Depression    IBS (irritable bowel syndrome) 03/2018   Meniere disease    right ear   Vitamin D deficiency 05/2016    Surgical Hx:  Past Surgical History:  Procedure Laterality Date   BLADDER SUSPENSION N/A 03/29/2019   Procedure: TRANSVAGINAL TAPE (TVT) PROCEDURE;  Surgeon: Gae Dry, MD;  Location: [facility];  Service: Gynecology;  Laterality: N/A;   COLONOSCOPY  04/24/2018   repeat due in 5 yrs; Dr. Aloha Gell REPAIR N/A 03/29/2019   Procedure: ANTERIOR REPAIR (CYSTOCELE);  Surgeon: Gae Dry, MD;  Location: [facility];  Service: Gynecology;  Laterality: N/A;   CYSTOSCOPY N/A 03/29/2019   Procedure: CYSTOSCOPY;  Surgeon: Gae Dry, MD;  Location: [facility];  Service: Gynecology;  Laterality: N/A;   DILATION AND CURETTAGE OF UTERUS     EXCISION MORTON'S NEUROMA Right 07/09/2022   Procedure: EXCISION MORTON'S NEUROMA;  Surgeon: Samara Deist, DPM;  Location: [facility];  Service: Podiatry;  Laterality: Right;   FOOT HARDWARE REMOVAL Right 10/06/2021   screw removed   METATARSAL OSTEOTOMY WITH BUNIONECTOMY Right 03/24/2021    FHx:  Family History  Problem Relation Age of Onset   Thyroid disease Mother    Meniere's disease Father    Colon cancer Father 39   Breast  cancer Neg Hx     Social History:  reports that she has never smoked. She has never used smokeless tobacco. She reports that she does not drink alcohol and does not use drugs.  Allergies:  Allergies  Allergen Reactions   Biaxin [Clarithromycin] Nausea And Vomiting and Other (See Comments)    Migraines, vomiting   Omnicef [Cefdinir] Hives    She tolerates with no problem - march 2024- so there is no allergy   Medications Prior to Admission  Medication Sig Dispense Refill   amphetamine-dextroamphetamine (ADDERALL XR) 10 MG 24 hr capsule Take 10 mg by mouth daily.     cefdinir (OMNICEF) 300 MG capsule Take 300 mg by mouth 2 (two) times daily.     MULTIPLE VITAMIN PO Take 1 tablet by mouth daily.     doxycycline (VIBRA-TABS) 100 MG tablet Take 100 mg by mouth 2 (two) times daily.     lisdexamfetamine (VYVANSE) 50 MG capsule Take 50 mg by mouth daily. (Patient not taking: Reported on 10/21/2022)     LORazepam (ATIVAN) 1 MG tablet Take 1 mg by mouth daily as needed.     oxyCODONE-acetaminophen (PERCOCET) 5-325 MG tablet Take 1-2 tablets by mouth every 6 (six) hours as needed for severe pain. Max 6 tabs per day (Patient not taking: Reported on 10/21/2022) 20 tablet 0   pramipexole (MIRAPEX) 0.25 MG tablet Take 0.25 mg by mouth at bedtime as needed.     traMADol (ULTRAM) 50 MG tablet  Take 50 mg by mouth 2 (two) times daily as needed.       Physical Exam: General: Alert and oriented.  No apparent distress.  Vascular: DP/PT pulses palpable bilateral.  Capillary fill time intact to digits bilateral.  No hair growth noted to digits bilateral.  Neuro: Light touch sensation intact to digits bilaterally.  Derm: Right foot skin incision between third and fourth toes extending to the metatarsal area appears to be well coapted with sutures intact, mild edema, minimal to no erythema, no active drainage, no signs of infection present.   MSK: Mild pain on palpation of the right forefoot.  Results for  orders placed or performed during the hospital encounter of 10/21/22 (from the past 48 hour(s))  CBC with Differential     Status: None   Collection Time: 10/21/22  7:49 PM  Result Value Ref Range   WBC 7.4 4.0 - 10.5 K/uL   RBC 4.92 3.87 - 5.11 MIL/uL   Hemoglobin 14.1 12.0 - 15.0 g/dL   HCT 42.3 36.0 - 46.0 %   MCV 86.0 80.0 - 100.0 fL   MCH 28.7 26.0 - 34.0 pg   MCHC 33.3 30.0 - 36.0 g/dL   RDW 12.4 11.5 - 15.5 %   Platelets 235 150 - 400 K/uL   nRBC 0.0 0.0 - 0.2 %   Neutrophils Relative % 50 %   Neutro Abs 3.7 1.7 - 7.7 K/uL   Lymphocytes Relative 44 %   Lymphs Abs 3.2 0.7 - 4.0 K/uL   Monocytes Relative 4 %   Monocytes Absolute 0.3 0.1 - 1.0 K/uL   Eosinophils Relative 1 %   Eosinophils Absolute 0.1 0.0 - 0.5 K/uL   Basophils Relative 1 %   Basophils Absolute 0.1 0.0 - 0.1 K/uL   Immature Granulocytes 0 %   Abs Immature Granulocytes 0.03 0.00 - 0.07 K/uL    Comment: Performed at [facility], [address] [place], [place] [address]  Comprehensive metabolic panel     Status: Abnormal   Collection Time: 10/21/22  7:49 PM  Result Value Ref Range   Sodium 137 135 - 145 mmol/L   Potassium 3.2 (L) 3.5 - 5.1 mmol/L   Chloride 99 98 - 111 mmol/L   CO2 27 22 - 32 mmol/L   Glucose, Bld 86 70 - 99 mg/dL    Comment: Glucose reference range applies only to samples taken after fasting for at least 8 hours.   BUN 12 6 - 20 mg/dL   Creatinine, Ser 0.71 0.44 - 1.00 mg/dL   Calcium 9.0 8.9 - 10.3 mg/dL   Total Protein 7.3 6.5 - 8.1 g/dL   Albumin 4.1 3.5 - 5.0 g/dL   AST 21 15 - 41 U/L   ALT 13 0 - 44 U/L   Alkaline Phosphatase 57 38 - 126 U/L   Total Bilirubin 0.6 0.3 - 1.2 mg/dL   GFR, Estimated >60 >60 mL/min    Comment: (NOTE) Calculated using the CKD-EPI Creatinine Equation (2021)    Anion gap 11 5 - 15    Comment: Performed at [facility], [address] [place], [place] [address]  Lactic acid, plasma     Status: None   Collection Time: 10/21/22   7:49 PM  Result Value Ref Range   Lactic Acid, Venous 0.7 0.5 - 1.9 mmol/L    Comment: Performed at [facility], [address] [place], [place] [address]  HIV Antibody (routine testing w rflx)  Status: None   Collection Time: 10/22/22  3:38 AM  Result Value Ref Range   HIV Screen 4th Generation wRfx Non Reactive Non Reactive    Comment: Performed at [facility], [address] [place], [place] [address]  Pregnancy, urine     Status: None   Collection Time: 10/22/22  8:43 AM  Result Value Ref Range   Preg Test, Ur NEGATIVE NEGATIVE    Comment: Performed at [facility], [address] [place], [place] [address]  Aerobic/Anaerobic Culture w Gram Stain (surgical/deep wound)     Status: None (Preliminary result)   Collection Time: 10/22/22  1:42 PM   Specimen: PATH Amputaion Arm/Leg; Tissue  Result Value Ref Range   Specimen Description      WOUND Performed at [facility], [address] [place], [place] [address]    Special Requests      RIGHT FOOT WOUND CULTURE Performed at [facility], [address] [place], [place] [address]    Gram Stain      NO WBC SEEN NO ORGANISMS SEEN Performed at [facility], [place] [address], [place], [place] [address]    Culture PENDING    Report Status PENDING   Surgical PCR screen     Status: None   Collection Time: 10/22/22  1:44 PM   Specimen: Nasal Mucosa; Nasal Swab  Result Value Ref Range   MRSA, PCR NEGATIVE NEGATIVE   Staphylococcus aureus NEGATIVE NEGATIVE    Comment: (NOTE) The Xpert SA Assay (FDA approved for NASAL specimens in patients 72 years of age and older), is one component of a comprehensive surveillance program. It is not intended to diagnose infection nor to guide or monitor treatment. Performed at [facility], [address] [place], [place] [address]   Aerobic/Anaerobic Culture w Gram Stain (surgical/deep wound)     Status: None (Preliminary  result)   Collection Time: 10/22/22  2:04 PM   Specimen: PATH Other; Tissue  Result Value Ref Range   Specimen Description      BONE Performed at [facility], [address] [place], [place] [address]    Special Requests RIGHT 4TH TOE BONE    Gram Stain      NO WBC SEEN NO ORGANISMS SEEN Performed at [facility], [place] [address], [place], [place] [address]    Culture PENDING    Report Status PENDING   Aerobic/Anaerobic Culture w Gram Stain (surgical/deep wound)     Status: None (Preliminary result)   Collection Time: 10/22/22  2:10 PM   Specimen: PATH Other; Tissue  Result Value Ref Range   Specimen Description      BONE Performed at [facility], [address] [place], [place] [address]    Special Requests RIGHT TOE BONE    Gram Stain      NO WBC SEEN NO ORGANISMS SEEN Performed at [facility], [place] [address], [place], [place] [address]    Culture PENDING    Report Status PENDING   Basic metabolic panel     Status: Abnormal   Collection Time: 10/23/22  5:29 AM  Result Value Ref Range   Sodium 134 (L) 135 - 145 mmol/L   Potassium 3.9 3.5 - 5.1 mmol/L   Chloride 99 98 - 111 mmol/L   CO2 24 22 - 32 mmol/L   Glucose, Bld 118 (H) 70 - 99 mg/dL    Comment: Glucose reference range applies only  to samples taken after fasting for at least 8 hours.   BUN 15 6 - 20 mg/dL   Creatinine, Ser 0.70 0.44 - 1.00 mg/dL   Calcium 8.9 8.9 - 10.3 mg/dL   GFR, Estimated >60 >60 mL/min    Comment: (NOTE) Calculated using the CKD-EPI Creatinine Equation (2021)    Anion gap 11 5 - 15    Comment: Performed at [facility], [address] [place], [place] [address]  CBC with Differential/Platelet     Status: Abnormal   Collection Time: 10/23/22  5:29 AM  Result Value Ref Range   WBC 9.7 4.0 - 10.5 K/uL   RBC 4.47 3.87 - 5.11 MIL/uL   Hemoglobin 12.9 12.0 - 15.0 g/dL   HCT 37.8 36.0 - 46.0 %   MCV 84.6 80.0 - 100.0 fL   MCH 28.9 26.0 -  34.0 pg   MCHC 34.1 30.0 - 36.0 g/dL   RDW 12.2 11.5 - 15.5 %   Platelets 237 150 - 400 K/uL   nRBC 0.0 0.0 - 0.2 %   Neutrophils Relative % 83 %   Neutro Abs 8.0 (H) 1.7 - 7.7 K/uL   Lymphocytes Relative 13 %   Lymphs Abs 1.2 0.7 - 4.0 K/uL   Monocytes Relative 4 %   Monocytes Absolute 0.4 0.1 - 1.0 K/uL   Eosinophils Relative 0 %   Eosinophils Absolute 0.0 0.0 - 0.5 K/uL   Basophils Relative 0 %   Basophils Absolute 0.0 0.0 - 0.1 K/uL   Immature Granulocytes 0 %   Abs Immature Granulocytes 0.02 0.00 - 0.07 K/uL    Comment: Performed at [facility], [address] [place], [place] [address]  Sedimentation rate     Status: None   Collection Time: 10/23/22  5:29 AM  Result Value Ref Range   Sed Rate 9 0 - 20 mm/hr    Comment: Performed at [facility], [address] [place], [place] [address]   DG MINI C-ARM IMAGE ONLY  Result Date: 10/22/2022 There is no interpretation for this exam.  This order is for images obtained during a surgical procedure.  Please See "Surgeries" Tab for more information regarding the procedure.   MR FOOT RIGHT WO CONTRAST  Result Date: 10/21/2022 CLINICAL DATA:  Right foot infection.  History of prior surgery. EXAM: MRI OF THE RIGHT FOREFOOT WITHOUT CONTRAST TECHNIQUE: Multiplanar, multisequence MR imaging of the right foot was performed. No intravenous contrast was administered. COMPARISON:  Prior study 02/01/2022 FINDINGS: Complex fluid collection between the third and fourth metatarsal heads and proximal phalanges. Findings concerning for an abscess. There is also significant surrounding inflammatory changes. Abnormal T1 and T2 signal intensity in the proximal phalanx of the fourth toe is suspicious for osteomyelitis. No definite findings for septic arthritis. Similar but less significant signal abnormality in the proximal phalanx of the third toe could suggest early osteomyelitis. Stable remote postsurgical changes involving the first  metatarsal and first proximal phalanx. No findings to suggest myofasciitis or pyomyositis. IMPRESSION: 1. Complex fluid collection between the third and fourth metatarsal heads and proximal phalanges concerning for an abscess. 2. Abnormal T1 and T2 signal intensity in the proximal phalanx of the fourth toe is consistent with osteomyelitis. 3. Similar but less significant signal abnormality in the proximal phalanx of the third toe could suggest early osteomyelitis. 4. Stable remote postsurgical changes involving the first metatarsal and first proximal phalanx. 5. No findings to suggest myofasciitis or pyomyositis. Electronically Signed  By: Marijo Sanes M.D.   On: 10/21/2022 14:57    Blood pressure 100/64, pulse 73, temperature 97.6 F (36.4 C), resp. rate 18, height 5\' 6"  (1.676 m), weight 65.8 kg, last menstrual period 10/21/2022, SpO2 98 %.  Assessment Abscess right third interspace with possible osteomyelitis third and fourth toes status post I&D with bone biopsy  Plan -Patient seen and examined. -So far culture reports with no growth to date of bone biopsies.  Area of tissue sample that was swabbed is also not growing any bacteria. -Incision site appears to be well coapted with sutures intact to the right foot.  Reduced erythema and edema present overall. -Unsure as to why patient has marrow edema in the digits but will wait on pathology report to further assess if some sort of bone infection could potentially been present.  ESR/CRP is within normal limits.  White blood cell count within normal limits.  Patient appears to be very stable at this time with no signs of infection present. -Redressed today with Xeroform to the incisional area followed by 4 x 4 gauze, gauze roll, Ace wrap.  Patient to keep dressings clean, dry, and intact until postoperative visit in outpatient clinic. -Patient remain partial weightbearing with heel contact and surgical shoe. -Patient would like to go home with  hydrocodone or oxycodone without acetaminophen/Tylenol.  Patient would only like to take that if she is having severe pain.  Patient would otherwise like to take ibuprofen/Tylenol. -Appreciate infectious disease recommendations.  Recommend going home on p.o. Augmentin twice daily for 7 days and Bactrim twice daily for 7 days.  Infectious disease to follow-up on culture reports and path report on an outpatient basis.  Believe patient can be discharged when medically stable per primary team.  Podiatry team to sign off at this time.  Patient to follow-up within 1 week of surgical date in outpatient clinic.  Caroline More, DPM 10/23/2022, 10:10 AM

## 2022-10-23 NOTE — Plan of Care (Signed)
  Problem: Education: Goal: Knowledge of General Education information will improve Description: Including pain rating scale, medication(s)/side effects and non-pharmacologic comfort measures Outcome: Progressing   Problem: Health Behavior/Discharge Planning: Goal: Ability to manage health-related needs will improve Outcome: Progressing   Problem: Clinical Measurements: Goal: Ability to maintain clinical measurements within normal limits will improve Outcome: Progressing Goal: Diagnostic test results will improve Outcome: Progressing   Problem: Activity: Goal: Risk for activity intolerance will decrease Outcome: Progressing   Problem: Nutrition: Goal: Adequate nutrition will be maintained Outcome: Progressing   Problem: Elimination: Goal: Will not experience complications related to bowel motility Outcome: Progressing Goal: Will not experience complications related to urinary retention Outcome: Progressing   Problem: Pain Managment: Goal: General experience of comfort will improve Outcome: Progressing   

## 2022-10-24 ENCOUNTER — Encounter: Payer: Self-pay | Admitting: Podiatry

## 2022-10-24 LAB — C-REACTIVE PROTEIN: CRP: 0.5 mg/dL (ref <1.0)

## 2022-10-24 NOTE — Anesthesia Postprocedure Evaluation (Signed)
Anesthesia Post Note  Patient: Jacqueline Howell  Procedure(s) Performed: RIGHT FOOT INCISION AND DRAINAGE (Right: Foot)  Patient location during evaluation: PACU Anesthesia Type: General Level of consciousness: awake and alert Pain management: pain level controlled Vital Signs Assessment: post-procedure vital signs reviewed and stable Respiratory status: spontaneous breathing, nonlabored ventilation, respiratory function stable and patient connected to nasal cannula oxygen Cardiovascular status: blood pressure returned to baseline and stable Postop Assessment: no apparent nausea or vomiting Anesthetic complications: no   No notable events documented.   Last Vitals:  Vitals:   10/22/22 2304 10/23/22 0808  BP: 126/81 100/64  Pulse: 72 73  Resp: 20 18  Temp: 36.6 C 36.4 C  SpO2: 95% 98%    Last Pain:  Vitals:   10/23/22 1159  TempSrc:   PainSc: 5                  Ilene Qua

## 2022-10-26 LAB — SURGICAL PATHOLOGY

## 2022-10-27 LAB — AEROBIC/ANAEROBIC CULTURE W GRAM STAIN (SURGICAL/DEEP WOUND)
Culture: NO GROWTH
Culture: NO GROWTH
Culture: NO GROWTH
Gram Stain: NONE SEEN
Gram Stain: NONE SEEN
Gram Stain: NONE SEEN

## 2022-10-30 ENCOUNTER — Other Ambulatory Visit: Payer: 59

## 2023-04-26 DIAGNOSIS — Z1379 Encounter for other screening for genetic and chromosomal anomalies: Secondary | ICD-10-CM

## 2023-04-26 DIAGNOSIS — Z800 Family history of malignant neoplasm of digestive organs: Secondary | ICD-10-CM

## 2023-04-26 HISTORY — DX: Family history of malignant neoplasm of digestive organs: Z80.0

## 2023-04-26 HISTORY — DX: Encounter for other screening for genetic and chromosomal anomalies: Z13.79

## 2023-04-28 NOTE — Progress Notes (Signed)
PCP:  Danella Penton, MD   Chief Complaint  Patient presents with   Gynecologic Exam    No concerns     HPI:      Ms. Jacqueline Howell is a 47 y.o. [id number] who LMP was Patient's last menstrual period was 04/18/2023 (approximate)., presents today for her annual examination.  Her menses are regular every 28-30 days, lasting 3-4 days, mod flow.  Dysmenorrhea mild (used to be moderate), improved with tylenol.  She does not have intermenstrual bleeding. No vasomotor sx.   S/p anterior colporrhaphy and sling for urinary incontinence 9/20 with Dr. Tiburcio Pea. Sx improved.   Sex activity: single partner, contraception - vasectomy.  No pain/bleeding/dryness. Last Pap: 11/15/17  Results were: no abnormalities /neg HPV DNA  Hx of STDs: none  Last mammogram: 04/04/19  Results were: normal--routine follow-up in 12 months There is no FH of breast cancer. There is no FH of ovarian cancer. The patient does not do self-breast exams. There is a FH of colon cancer in her dad at age 65.  Genetic testing not done.   Colonoscopy done 2019 for IBS sx with Dr. Sheikh--constipation/FH. Repeat due after 5 yrs due to FH. Chief Financial Officer but needs [facility].   Tobacco use: The patient denies current or previous tobacco use. Alcohol use: none No drug use.  Exercise: mod active  She does get adequate calcium but not Vitamin D in her diet.  Labs with PCP.    Past Medical History:  Diagnosis Date   ADHD (attention deficit hyperactivity disorder)    Chronic constipation    Depression    IBS (irritable bowel syndrome) 03/2018   Meniere disease    right ear   Vitamin D deficiency 05/2016    Past Surgical History:  Procedure Laterality Date   BLADDER SUSPENSION N/A 03/29/2019   Procedure: TRANSVAGINAL TAPE (TVT) PROCEDURE;  Surgeon: Nadara Mustard, MD;  Location: [facility];  Service: Gynecology;  Laterality: N/A;   COLONOSCOPY  04/24/2018   repeat due in 5 yrs; Dr. Dorena Dew REPAIR N/A  03/29/2019   Procedure: ANTERIOR REPAIR (CYSTOCELE);  Surgeon: Nadara Mustard, MD;  Location: [facility];  Service: Gynecology;  Laterality: N/A;   CYSTOSCOPY N/A 03/29/2019   Procedure: CYSTOSCOPY;  Surgeon: Nadara Mustard, MD;  Location: [facility];  Service: Gynecology;  Laterality: N/A;   DILATION AND CURETTAGE OF UTERUS     EXCISION MORTON'S NEUROMA Right 07/09/2022   Procedure: EXCISION MORTON'S NEUROMA;  Surgeon: [name], DPM;  Location: [facility];  Service: Podiatry;  Laterality: Right;   FOOT HARDWARE REMOVAL Right 10/06/2021   screw removed   INCISION AND DRAINAGE Right 10/22/2022   Procedure: RIGHT FOOT INCISION AND DRAINAGE;  Surgeon: Rosetta Posner, DPM;  Location: [facility];  Service: Podiatry;  Laterality: Right;   METATARSAL OSTEOTOMY WITH BUNIONECTOMY Right 03/24/2021    Family History  Problem Relation Age of Onset   Thyroid disease Mother    Meniere's disease Father    Colon cancer Father 71   Breast cancer Neg Hx     Social History   Socioeconomic History   Marital status: Married    Spouse name: Karleen Hampshire   Number of children: 4   Years of education: Not on file   Highest education level: Not on file  Occupational History   Not on file  Tobacco Use   Smoking status: Never   Smokeless tobacco: Never  Vaping Use   Vaping status: Never  Used  Substance and Sexual Activity   Alcohol use: No   Drug use: No   Sexual activity: Yes    Comment: Vasectomy  Other Topics Concern   Not on file  Social History Narrative   Not on file   Social Determinants of Health   Financial Resource Strain: Not on file  Food Insecurity: No Food Insecurity (10/21/2022)   Hunger Vital Sign    Worried About Running Out of Food in the Last Year: Never true    Ran Out of Food in the Last Year: Never true  Transportation Needs: No Transportation Needs (10/21/2022)   PRAPARE - Administrator, Civil Service (Medical): No    Lack of Transportation (Non-Medical): No   Physical Activity: Not on file  Stress: Not on file  Social Connections: Not on file  Intimate Partner Violence: Not At Risk (10/21/2022)   Humiliation, Afraid, Rape, and Kick questionnaire    Fear of Current or Ex-Partner: No    Emotionally Abused: No    Physically Abused: No    Sexually Abused: No    Outpatient Medications Prior to Visit  Medication Sig Dispense Refill   amphetamine-dextroamphetamine (ADDERALL) 20 MG tablet Take 20 mg by mouth 2 (two) times daily.     LORazepam (ATIVAN) 1 MG tablet Take 1 mg by mouth daily as needed.     MULTIPLE VITAMIN PO Take 1 tablet by mouth daily.     pramipexole (MIRAPEX) 0.25 MG tablet Take 0.25 mg by mouth at bedtime as needed.     traMADol (ULTRAM) 50 MG tablet Take by mouth.     amoxicillin-clavulanate (AUGMENTIN) 875-125 MG tablet Take 1 tablet by mouth 2 (two) times daily. 14 tablet 0   amphetamine-dextroamphetamine (ADDERALL XR) 10 MG 24 hr capsule Take 10 mg by mouth daily.     sulfamethoxazole-trimethoprim (BACTRIM DS) 800-160 MG tablet Take 1 tablet by mouth 2 (two) times daily. 14 tablet 0   No facility-administered medications prior to visit.      ROS:  Review of Systems  Constitutional:  Negative for fatigue, fever and unexpected weight change.  Respiratory:  Negative for cough, shortness of breath and wheezing.   Cardiovascular:  Negative for chest pain, palpitations and leg swelling.  Gastrointestinal:  Positive for constipation. Negative for blood in stool, diarrhea, nausea and vomiting.  Endocrine: Negative for cold intolerance, heat intolerance and polyuria.  Genitourinary:  Negative for dyspareunia, dysuria, flank pain, frequency, genital sores, hematuria, menstrual problem, pelvic pain, urgency, vaginal bleeding, vaginal discharge and vaginal pain.  Musculoskeletal:  Negative for back pain, joint swelling and myalgias.  Skin:  Negative for rash.  Neurological:  Negative for dizziness, syncope, light-headedness,  numbness and headaches.  Hematological:  Negative for adenopathy.  Psychiatric/Behavioral:  Negative for agitation, confusion, sleep disturbance and suicidal ideas. The patient is not nervous/anxious.   BREAST: No symptoms   Objective: BP 105/68   Pulse 66   Ht 5\' 6"  (1.676 m)   Wt 140 lb (63.5 kg)   LMP 04/18/2023 (Approximate)   BMI 22.60 kg/m    Physical Exam Constitutional:      Appearance: She is well-developed.  Genitourinary:     Vulva normal.     Right Labia: No rash, tenderness or lesions.    Left Labia: No tenderness, lesions or rash.    No vaginal discharge, erythema or tenderness.      Right Adnexa: not tender and no mass present.    Left Adnexa: not tender  and no mass present.    No cervical motion tenderness, friability or polyp.     Uterus is not enlarged or tender.  Breasts:    Right: No mass, nipple discharge, skin change or tenderness.     Left: No mass, nipple discharge, skin change or tenderness.  Neck:     Thyroid: No thyromegaly.  Cardiovascular:     Rate and Rhythm: Normal rate and regular rhythm.     Heart sounds: Normal heart sounds. No murmur heard. Pulmonary:     Effort: Pulmonary effort is normal.     Breath sounds: Normal breath sounds.  Abdominal:     Palpations: Abdomen is soft.     Tenderness: There is no abdominal tenderness. There is no guarding or rebound.  Musculoskeletal:        General: Normal range of motion.     Cervical back: Normal range of motion.  Lymphadenopathy:     Cervical: No cervical adenopathy.  Neurological:     General: No focal deficit present.     Mental Status: She is alert and oriented to person, place, and time.     Cranial Nerves: No cranial nerve deficit.  Skin:    General: Skin is warm and dry.  Psychiatric:        Mood and Affect: Mood normal.        Behavior: Behavior normal.        Thought Content: Thought content normal.        Judgment: Judgment normal.  Vitals reviewed.      Assessment/Plan: Encounter for annual routine gynecological examination  Cervical cancer screening - Plan: Cytology - PAP  Screening for HPV (human papillomavirus) - Plan: Cytology - PAP  Encounter for screening mammogram for malignant neoplasm of breast - Plan: [facility] 3D SCREENING MAMMOGRAM BILATERAL BREAST; pt to schedule mammo  Screening for colon cancer - Plan: Integrated [facility] ([facility]), Ambulatory referral to Gastroenterology; MyRisk testing discussed and done today. Will f/u with results.  Family history of colon cancer in father - Plan: Ambulatory referral to Gastroenterology; refer to [facility] due to insurance.            GYN counsel breast self exam, mammography screening, adequate intake of calcium and vitamin D, diet and exercise     F/U  Return in about 1 year (around 05/01/2024).  Jatoya Armbrister B. Marielouise Amey, PA-C 05/02/2023 3:56 PM

## 2023-05-02 ENCOUNTER — Encounter: Payer: Self-pay | Admitting: Obstetrics and Gynecology

## 2023-05-02 ENCOUNTER — Ambulatory Visit (INDEPENDENT_AMBULATORY_CARE_PROVIDER_SITE_OTHER): Payer: 59 | Admitting: Obstetrics and Gynecology

## 2023-05-02 ENCOUNTER — Other Ambulatory Visit (HOSPITAL_COMMUNITY)
Admission: RE | Admit: 2023-05-02 | Discharge: 2023-05-02 | Disposition: A | Discharge status: Home / Self Care | Payer: 59 | Source: Ambulatory Visit | Attending: Obstetrics and Gynecology | Admitting: Obstetrics and Gynecology

## 2023-05-02 VITALS — BP 105/68 | HR 66 | Ht 66.0 in | Wt 140.0 lb

## 2023-05-02 DIAGNOSIS — Z1231 Encounter for screening mammogram for malignant neoplasm of breast: Secondary | ICD-10-CM

## 2023-05-02 DIAGNOSIS — Z1151 Encounter for screening for human papillomavirus (HPV): Secondary | ICD-10-CM | POA: Insufficient documentation

## 2023-05-02 DIAGNOSIS — Z124 Encounter for screening for malignant neoplasm of cervix: Secondary | ICD-10-CM

## 2023-05-02 DIAGNOSIS — Z01419 Encounter for gynecological examination (general) (routine) without abnormal findings: Secondary | ICD-10-CM | POA: Diagnosis not present

## 2023-05-02 DIAGNOSIS — Z800 Family history of malignant neoplasm of digestive organs: Secondary | ICD-10-CM

## 2023-05-02 DIAGNOSIS — Z1211 Encounter for screening for malignant neoplasm of colon: Secondary | ICD-10-CM

## 2023-05-02 NOTE — Patient Instructions (Signed)
I value your feedback and you entrusting us with your care. If you get a [facility] patient survey, I would appreciate you taking the time to let us know about your experience today. Thank you!  [facility] ([place])--[phone]  

## 2023-05-06 LAB — CYTOLOGY - PAP
Comment: NEGATIVE
Diagnosis: UNDETERMINED — AB
High risk HPV: NEGATIVE

## 2023-05-16 ENCOUNTER — Encounter: Payer: Self-pay | Admitting: Obstetrics and Gynecology

## 2023-05-24 ENCOUNTER — Telehealth: Payer: Self-pay | Admitting: Obstetrics and Gynecology

## 2023-05-24 NOTE — Telephone Encounter (Signed)
Pt aware of neg MyRisk results. IBIS=10.6%/riskscore=6.3%. No increased screening recommended other than Q5 yr colonoscopies, which she is already doing.  Patient understands these results only apply to her and her children, and this is not indicative of genetic testing results of her other family members. It is recommended that her other family members have genetic testing done.  Pt also understands negative genetic testing doesn't mean she will never get any of these cancers.   Hard copy mailed to pt. F/u prn.

## 2023-06-15 ENCOUNTER — Ambulatory Visit
Admission: RE | Admit: 2023-06-15 | Discharge: 2023-06-15 | Disposition: A | Discharge status: Home / Self Care | Payer: 59 | Source: Ambulatory Visit | Attending: Obstetrics and Gynecology | Admitting: Obstetrics and Gynecology

## 2023-06-15 DIAGNOSIS — Z1231 Encounter for screening mammogram for malignant neoplasm of breast: Secondary | ICD-10-CM | POA: Insufficient documentation

## 2023-07-26 ENCOUNTER — Other Ambulatory Visit (INDEPENDENT_AMBULATORY_CARE_PROVIDER_SITE_OTHER): Payer: Self-pay | Admitting: Nurse Practitioner

## 2023-07-26 DIAGNOSIS — I83893 Varicose veins of bilateral lower extremities with other complications: Secondary | ICD-10-CM

## 2023-07-29 ENCOUNTER — Encounter (INDEPENDENT_AMBULATORY_CARE_PROVIDER_SITE_OTHER): Payer: Self-pay | Admitting: Nurse Practitioner

## 2023-07-29 ENCOUNTER — Ambulatory Visit (INDEPENDENT_AMBULATORY_CARE_PROVIDER_SITE_OTHER): Payer: 59 | Admitting: Nurse Practitioner

## 2023-07-29 ENCOUNTER — Ambulatory Visit (INDEPENDENT_AMBULATORY_CARE_PROVIDER_SITE_OTHER): Payer: 59

## 2023-07-29 VITALS — BP 135/94 | HR 83 | Resp 16 | Ht 66.0 in | Wt 140.0 lb

## 2023-07-29 DIAGNOSIS — I83893 Varicose veins of bilateral lower extremities with other complications: Secondary | ICD-10-CM | POA: Diagnosis not present

## 2023-07-29 DIAGNOSIS — I83811 Varicose veins of right lower extremities with pain: Secondary | ICD-10-CM | POA: Diagnosis not present

## 2023-07-29 DIAGNOSIS — I83812 Varicose veins of left lower extremities with pain: Secondary | ICD-10-CM | POA: Diagnosis not present

## 2023-07-31 NOTE — Progress Notes (Signed)
 Subjective:    Patient ID: Jacqueline Howell, female    DOB: [birth year], 48 y.o.   MRN: [id number] Chief Complaint  Patient presents with   New Patient (Initial Visit)    Ref Cleotilde ble vv w/bulging and pain    The patient is a 48 year old female who returns today for evaluation of her varicose veins.  The patient has previously had treatment for varicose veins done several years ago.  She had ablation done on her right lower extremity and it appears to have been done twice.  However despite this she has recently began to have significant pain and a varicosity that is present in her right lower extremity.  She also notes that her left has also begun to exhibit the same type of pain symptoms.  She notes that the pain in her right lower extremity is similar to the pain she had prior to the ablations.  The patient continues to have pain in the lower extremities with dependency. The pain is lessened with elevation. Graduated compression stockings, Class I (20-30 mmHg), have been worn but the stockings do not eliminate the leg pain. Over-the-counter analgesics do not improve the symptoms. The degree of discomfort continues to interfere with daily activities. The patient notes the pain in the legs is causing problems with daily exercise, at the workplace and even with household activities and maintenance such as standing in the kitchen preparing meals and doing dishes.   Venous ultrasound shows normal deep venous system, no evidence of acute or chronic DVT.  Superficial reflux is present in the left great saphenous vein.  There is also deep venous insufficiency present bilaterally.    Review of Systems  Cardiovascular:  Positive for leg swelling.  All other systems reviewed and are negative.      Objective:   Physical Exam Vitals reviewed.  Cardiovascular:     Rate and Rhythm: Normal rate.     Pulses: Normal pulses.  Pulmonary:     Effort: Pulmonary effort is normal.  Musculoskeletal:         General: Tenderness present.  Neurological:     Mental Status: She is alert and oriented to person, place, and time.  Psychiatric:        Mood and Affect: Mood normal.        Behavior: Behavior normal.        Thought Content: Thought content normal.        Judgment: Judgment normal.     BP (!) 135/94   Pulse 83   Resp 16   Ht 5' 6 (1.676 m)   Wt 140 lb (63.5 kg)   BMI 22.60 kg/m   Past Medical History:  Diagnosis Date   ADHD (attention deficit hyperactivity disorder)    Chronic constipation    Depression    Family history of colon cancer 04/2023   MyRisk neg   Genetic testing 04/2023   MyRisk neg; IBIS=10.6%/riskscore=6.3%   IBS (irritable bowel syndrome) 03/2018   Meniere disease    right ear   Vitamin D deficiency 05/2016    Social History   Socioeconomic History   Marital status: Married    Spouse name: Jacques   Number of children: 4   Years of education: Not on file   Highest education level: Not on file  Occupational History   Not on file  Tobacco Use   Smoking status: Never   Smokeless tobacco: Never  Vaping Use   Vaping status: Never Used  Substance and  Sexual Activity   Alcohol use: No   Drug use: No   Sexual activity: Yes    Comment: Vasectomy  Other Topics Concern   Not on file  Social History Narrative   Not on file   Social Drivers of Health   Financial Resource Strain: Low Risk  (05/27/2023)   Received from [facility]   Overall Financial Resource Strain (CARDIA)    Difficulty of Paying Living Expenses: Not hard at all  Food Insecurity: No Food Insecurity (05/27/2023)   Received from [facility]   Hunger Vital Sign    Worried About Running Out of Food in the Last Year: Never true    Ran Out of Food in the Last Year: Never true  Transportation Needs: No Transportation Needs (05/27/2023)   Received from [facility] - Transportation    In the past 12 months, has lack of  transportation kept you from medical appointments or from getting medications?: No    Lack of Transportation (Non-Medical): No  Physical Activity: Not on file  Stress: Not on file  Social Connections: Not on file  Intimate Partner Violence: Not At Risk (10/21/2022)   Humiliation, Afraid, Rape, and Kick questionnaire    Fear of Current or Ex-Partner: No    Emotionally Abused: No    Physically Abused: No    Sexually Abused: No    Past Surgical History:  Procedure Laterality Date   BLADDER SUSPENSION N/A 03/29/2019   Procedure: TRANSVAGINAL TAPE (TVT) PROCEDURE;  Surgeon: Arloa Lamar SQUIBB, MD;  Location: [facility];  Service: Gynecology;  Laterality: N/A;   COLONOSCOPY  04/24/2018   repeat due in 5 yrs; Dr. Sherrill MASH REPAIR N/A 03/29/2019   Procedure: ANTERIOR REPAIR (CYSTOCELE);  Surgeon: Arloa Lamar SQUIBB, MD;  Location: [facility];  Service: Gynecology;  Laterality: N/A;   CYSTOSCOPY N/A 03/29/2019   Procedure: CYSTOSCOPY;  Surgeon: Arloa Lamar SQUIBB, MD;  Location: [facility];  Service: Gynecology;  Laterality: N/A;   DILATION AND CURETTAGE OF UTERUS     EXCISION MORTON'S NEUROMA Right 07/09/2022   Procedure: EXCISION MORTON'S NEUROMA;  Surgeon: Ashley Soulier, DPM;  Location: [facility];  Service: Podiatry;  Laterality: Right;   FOOT HARDWARE REMOVAL Right 10/06/2021   screw removed   INCISION AND DRAINAGE Right 10/22/2022   Procedure: RIGHT FOOT INCISION AND DRAINAGE;  Surgeon: Lennie Barter, DPM;  Location: [facility];  Service: Podiatry;  Laterality: Right;   METATARSAL OSTEOTOMY WITH BUNIONECTOMY Right 03/24/2021    Family History  Problem Relation Age of Onset   Thyroid disease Mother    Meniere's disease Father    Colon cancer Father 71   Breast cancer Neg Hx     Allergies  Allergen Reactions   Biaxin [Clarithromycin] Nausea And Vomiting and Other (See Comments)    Migraines, vomiting   Omnicef [Cefdinir] Hives    She tolerates with no problem - march 2024- so there  is no allergy   Oxycodone -Acetaminophen      Other Reaction(s): Hallucination   Trazodone  Other (See Comments)    Hypersomnia       Latest Ref Rng & Units 10/23/2022    5:29 AM 10/21/2022    7:49 PM 01/14/2022   10:31 AM  CBC  WBC 4.0 - 10.5 K/uL 9.7  7.4  5.9   Hemoglobin 12.0 - 15.0 g/dL 87.0  85.8  85.8   Hematocrit 36.0 - 46.0 % 37.8  42.3  41.7  Platelets 150 - 400 K/uL 237  235  244       CMP     Component Value Date/Time   NA 134 (L) 10/23/2022 0529   NA 138 01/14/2022 1031   K 3.9 10/23/2022 0529   CL 99 10/23/2022 0529   CO2 24 10/23/2022 0529   GLUCOSE 118 (H) 10/23/2022 0529   BUN 15 10/23/2022 0529   BUN 11 01/14/2022 1031   CREATININE 0.70 10/23/2022 0529   CALCIUM 8.9 10/23/2022 0529   PROT 7.3 10/21/2022 1949   PROT 7.0 01/14/2022 1031   ALBUMIN 4.1 10/21/2022 1949   ALBUMIN 4.6 01/14/2022 1031   AST 21 10/21/2022 1949   ALT 13 10/21/2022 1949   ALKPHOS 57 10/21/2022 1949   BILITOT 0.6 10/21/2022 1949   BILITOT 0.2 01/14/2022 1031   EGFR 85 01/14/2022 1031   GFRNONAA >60 10/23/2022 0529     No results found.     Assessment & Plan:   1. Varicose veins of right lower extremity with pain (Primary) Recommend:  The patient has had successful ablation of the previously incompetent saphenous venous system but still has persistent symptoms of pain and swelling that are having a negative impact on daily life and daily activities.  Patient should undergo injection sclerotherapy to treat the residual varicosities.  The risks, benefits and alternative therapies were reviewed in detail with the patient.  All questions were answered.  The patient agrees to proceed with foam sclerotherapy at their convenience.  The patient will continue wearing the graduated compression stockings and using the over-the-counter pain medications to treat her symptoms.      2. Varicose veins of leg with pain, left Recommend  I have reviewed my previous  discussion with  the patient regarding  varicose veins and why they cause symptoms. Patient will continue  wearing graduated compression stockings class 1 on a daily basis, beginning first thing in the morning and removing them in the evening.  The patient is CEAP [id number].  The patient has been wearing compression for more than 12 weeks with no or little benefit.  The patient has been exercising daily for more than 12 weeks. The patient has been elevating and taking OTC pain medications for more than 12 weeks.  None of these have have eliminated the pain related to the varicose veins and venous reflux or the discomfort regarding venous congestion.    In addition, behavioral modification including elevation during the day was again discussed and this will continue.  The patient has utilized over the counter pain medications and has been exercising.  However, at this time conservative therapy has not alleviated the patient's symptoms of leg pain and swelling  Recommend: laser ablation of the left great saphenous veins to eliminate the symptoms of pain and swelling of the lower extremities caused by the severe superficial venous reflux disease.    Current Outpatient Medications on File Prior to Visit  Medication Sig Dispense Refill   amphetamine -dextroamphetamine  (ADDERALL) 20 MG tablet Take 20 mg by mouth 2 (two) times daily.     LORazepam (ATIVAN) 1 MG tablet Take 1 mg by mouth daily as needed.     pramipexole (MIRAPEX) 0.25 MG tablet Take 0.25 mg by mouth at bedtime as needed.     MULTIPLE VITAMIN PO Take 1 tablet by mouth daily. (Patient not taking: Reported on 07/29/2023)     traMADol  (ULTRAM ) 50 MG tablet Take by mouth. (Patient not taking: Reported on 07/29/2023)     No  current facility-administered medications on file prior to visit.    There are no Patient Instructions on file for this visit. No follow-ups on file.   Gaylan Fauver [place], NP

## 2023-08-01 ENCOUNTER — Encounter (INDEPENDENT_AMBULATORY_CARE_PROVIDER_SITE_OTHER): Payer: Self-pay | Admitting: Nurse Practitioner

## 2023-09-15 ENCOUNTER — Encounter (INDEPENDENT_AMBULATORY_CARE_PROVIDER_SITE_OTHER): Payer: Self-pay

## 2023-09-15 NOTE — Telephone Encounter (Signed)
Hi Tanya,  Could you please reach out to the patient to provide an update.  Thanks.

## 2023-09-23 NOTE — Telephone Encounter (Signed)
 Spoke with patient and explained that there are still people in front of her (from December) that need to get scheduled for procedures. I also explained that we are on a back order of laser ablation kits so we are in a holding pattern for that. Pt acknowledged and I advised that she is on "my list" and that I would be calling as soon as I hear that we have the supplies needed.

## 2023-09-30 ENCOUNTER — Ambulatory Visit: Admit: 2023-09-30 | Payer: 59

## 2023-10-21 ENCOUNTER — Ambulatory Visit (INDEPENDENT_AMBULATORY_CARE_PROVIDER_SITE_OTHER): Admitting: Vascular Surgery

## 2023-10-21 ENCOUNTER — Encounter (INDEPENDENT_AMBULATORY_CARE_PROVIDER_SITE_OTHER): Payer: Self-pay | Admitting: Vascular Surgery

## 2023-10-21 VITALS — BP 110/76 | HR 67 | Resp 18

## 2023-10-21 DIAGNOSIS — I83811 Varicose veins of right lower extremities with pain: Secondary | ICD-10-CM | POA: Diagnosis not present

## 2023-10-21 NOTE — Progress Notes (Signed)
 Jacqueline Howell is a 48 y.o.female who presents with painful varicose veins of the right leg  Past Medical History:  Diagnosis Date   ADHD (attention deficit hyperactivity disorder)    Chronic constipation    Depression    Family history of colon cancer 04/2023   MyRisk neg   Genetic testing 04/2023   MyRisk neg; IBIS=10.6%/riskscore=6.3%   IBS (irritable bowel syndrome) 03/2018   Meniere disease    right ear   Vitamin D deficiency 05/2016    Past Surgical History:  Procedure Laterality Date   BLADDER SUSPENSION N/A 03/29/2019   Procedure: TRANSVAGINAL TAPE (TVT) PROCEDURE;  Surgeon: Nadara Mustard, MD;  Location: [facility];  Service: Gynecology;  Laterality: N/A;   COLONOSCOPY  04/24/2018   repeat due in 5 yrs; Dr. Dorena Shandricka Monroy REPAIR N/A 03/29/2019   Procedure: ANTERIOR REPAIR (CYSTOCELE);  Surgeon: Nadara Mustard, MD;  Location: [facility];  Service: Gynecology;  Laterality: N/A;   CYSTOSCOPY N/A 03/29/2019   Procedure: CYSTOSCOPY;  Surgeon: Nadara Mustard, MD;  Location: [facility];  Service: Gynecology;  Laterality: N/A;   DILATION AND CURETTAGE OF UTERUS     EXCISION MORTON'S NEUROMA Right 07/09/2022   Procedure: EXCISION MORTON'S NEUROMA;  Surgeon: [name], DPM;  Location: [facility];  Service: Podiatry;  Laterality: Right;   FOOT HARDWARE REMOVAL Right 10/06/2021   screw removed   INCISION AND DRAINAGE Right 10/22/2022   Procedure: RIGHT FOOT INCISION AND DRAINAGE;  Surgeon: Rosetta Posner, DPM;  Location: [facility];  Service: Podiatry;  Laterality: Right;   METATARSAL OSTEOTOMY WITH BUNIONECTOMY Right 03/24/2021    Current Outpatient Medications  Medication Sig Dispense Refill   amphetamine-dextroamphetamine (ADDERALL) 20 MG tablet Take 20 mg by mouth 2 (two) times daily.     LORazepam (ATIVAN) 1 MG tablet Take 1 mg by mouth daily as needed.     pramipexole (MIRAPEX) 0.25 MG tablet Take 0.25 mg by mouth at bedtime as needed.     MULTIPLE VITAMIN PO Take 1  tablet by mouth daily. (Patient not taking: Reported on 07/29/2023)     traMADol (ULTRAM) 50 MG tablet Take by mouth. (Patient not taking: Reported on 07/29/2023)     No current facility-administered medications for this visit.    Allergies  Allergen Reactions   Biaxin [Clarithromycin] Nausea And Vomiting and Other (See Comments)    Migraines, vomiting   Omnicef [Cefdinir] Hives    She tolerates with no problem - march 2024- so there is no allergy   Oxycodone-Acetaminophen     Other Reaction(s): Hallucination   Trazodone Other (See Comments)    Hypersomnia    Indication: Patient presents with symptomatic varicose veins of the right lower extremity.  Procedure: Foam sclerotherapy was performed on the right lower extremity. Using ultrasound guidance, 5 mL of foam Sotradecol was used to inject the varicosities of the right lower extremity. Compression wraps were placed. The patient tolerated the procedure well.

## 2023-10-25 ENCOUNTER — Encounter: Payer: Self-pay | Admitting: *Deleted

## 2023-10-27 ENCOUNTER — Encounter: Payer: Self-pay | Admitting: *Deleted

## 2023-11-03 ENCOUNTER — Telehealth (INDEPENDENT_AMBULATORY_CARE_PROVIDER_SITE_OTHER): Payer: Self-pay | Admitting: Vascular Surgery

## 2023-11-03 ENCOUNTER — Other Ambulatory Visit (INDEPENDENT_AMBULATORY_CARE_PROVIDER_SITE_OTHER): Payer: Self-pay

## 2023-11-03 MED ORDER — ALPRAZOLAM 0.5 MG PO TABS: ORAL_TABLET | ORAL | 0 refills | Status: DC

## 2023-11-03 NOTE — Telephone Encounter (Signed)
 Patient is scheduled for a left leg GSV laser with Dr. Wyn Quaker on 5.16.25. She will need the standard protocol RX sent to [facility] [address] in [place]. Thank you.

## 2023-11-03 NOTE — Telephone Encounter (Signed)
 sent

## 2023-11-04 ENCOUNTER — Ambulatory Visit: Admission: RE | Admit: 2023-11-04 | Payer: 59 | Source: Home / Self Care

## 2023-11-04 SURGERY — COLONOSCOPY
Anesthesia: General

## 2023-11-04 SURGERY — COLONOSCOPY WITH PROPOFOL
Anesthesia: General

## 2023-12-09 ENCOUNTER — Encounter (INDEPENDENT_AMBULATORY_CARE_PROVIDER_SITE_OTHER): Payer: Self-pay | Admitting: Vascular Surgery

## 2023-12-09 ENCOUNTER — Ambulatory Visit (INDEPENDENT_AMBULATORY_CARE_PROVIDER_SITE_OTHER): Admitting: Vascular Surgery

## 2023-12-09 VITALS — BP 131/87 | HR 86 | Resp 16 | Wt 134.0 lb

## 2023-12-09 DIAGNOSIS — I83813 Varicose veins of bilateral lower extremities with pain: Secondary | ICD-10-CM

## 2023-12-09 NOTE — Progress Notes (Signed)
 Jacqueline Howell is a 48 y.o. female who presents with symptomatic venous reflux  Past Medical History:  Diagnosis Date   ADHD (attention deficit hyperactivity disorder)    Anxiety    Chronic constipation    Depression    Family history of colon cancer 04/2023   MyRisk neg   Genetic testing 04/2023   MyRisk neg; IBIS=10.6%/riskscore=6.3%   IBS (irritable bowel syndrome) 03/2018   Meniere disease    right ear   Vitamin D deficiency 05/2016    Past Surgical History:  Procedure Laterality Date   BLADDER SUSPENSION N/A 03/29/2019   Procedure: TRANSVAGINAL TAPE (TVT) PROCEDURE;  Surgeon: Alben Alma, MD;  Location: [facility];  Service: Gynecology;  Laterality: N/A;   COLONOSCOPY  04/24/2018   repeat due in 5 yrs; Dr. Elex Grimmer REPAIR N/A 03/29/2019   Procedure: ANTERIOR REPAIR (CYSTOCELE);  Surgeon: Alben Alma, MD;  Location: [facility];  Service: Gynecology;  Laterality: N/A;   CYSTOSCOPY N/A 03/29/2019   Procedure: CYSTOSCOPY;  Surgeon: Alben Alma, MD;  Location: [facility];  Service: Gynecology;  Laterality: N/A;   DILATION AND CURETTAGE OF UTERUS     EXCISION MORTON'S NEUROMA Right 07/09/2022   Procedure: EXCISION MORTON'S NEUROMA;  Surgeon: Anell Baptist, DPM;  Location: [facility];  Service: Podiatry;  Laterality: Right;   FOOT HARDWARE REMOVAL Right 10/06/2021   screw removed   INCISION AND DRAINAGE Right 10/22/2022   Procedure: RIGHT FOOT INCISION AND DRAINAGE;  Surgeon: Pink Bridges, DPM;  Location: [facility];  Service: Podiatry;  Laterality: Right;   METATARSAL OSTEOTOMY WITH BUNIONECTOMY Right 03/24/2021     Current Outpatient Medications:    amphetamine -dextroamphetamine  (ADDERALL) 20 MG tablet, Take 20 mg by mouth 2 (two) times daily., Disp: , Rfl:    LORazepam (ATIVAN) 1 MG tablet, Take 1 mg by mouth daily as needed., Disp: , Rfl:    pramipexole (MIRAPEX) 0.25 MG tablet, Take 0.25 mg by mouth at bedtime as needed., Disp: , Rfl:    ALPRAZolam   (XANAX ) 0.5 MG tablet, Take 1st tablet one hour prior to arrival and take 2nd tablet once arrived in the office (Patient not taking: Reported on 12/09/2023), Disp: 2 tablet, Rfl: 0   MULTIPLE VITAMIN PO, Take 1 tablet by mouth daily. (Patient not taking: Reported on 07/29/2023), Disp: , Rfl:    traMADol  (ULTRAM ) 50 MG tablet, Take by mouth. (Patient not taking: Reported on 07/29/2023), Disp: , Rfl:   Allergies  Allergen Reactions   Biaxin [Clarithromycin] Nausea And Vomiting and Other (See Comments)    Migraines, vomiting   Omnicef [Cefdinir] Hives    She tolerates with no problem - march 2024- so there is no allergy   Oxycodone -Acetaminophen      Other Reaction(s): Hallucination   Trazodone  Other (See Comments)    Hypersomnia     Varicose veins of leg with pain, bilateral     PLAN: The patient's left lower extremity was sterilely prepped and draped. The ultrasound machine was used to visualize the saphenous vein throughout its course. A segment in the mid to upper calf was selected for access. The saphenous vein was accessed without difficulty using ultrasound guidance with a micropuncture needle. A 0.018 wire was then placed beyond the saphenofemoral junction and the needle was removed. The 65 cm sheath was then placed over the wire and the wire and dilator were removed. The laser fiber was then placed through the sheath and its tip was placed approximately 4-5 centimeters below the  saphenofemoral junction. Tumescent anesthesia was then created with a dilute lidocaine  solution. Laser energy was then delivered with constant withdrawal of the sheath and laser fiber. Approximately 1533 joules of energy were delivered over a length of 36 centimeters using a 1470 Hz VenaCure machine at 7 W. Sterile dressings were placed. The patient tolerated the procedure well without obvious complications.   Follow-up in 1 week with post-laser duplex.

## 2023-12-13 ENCOUNTER — Encounter (INDEPENDENT_AMBULATORY_CARE_PROVIDER_SITE_OTHER): Payer: Self-pay

## 2023-12-14 ENCOUNTER — Other Ambulatory Visit (INDEPENDENT_AMBULATORY_CARE_PROVIDER_SITE_OTHER): Payer: Self-pay | Admitting: Vascular Surgery

## 2023-12-14 DIAGNOSIS — I83813 Varicose veins of bilateral lower extremities with pain: Secondary | ICD-10-CM

## 2023-12-15 ENCOUNTER — Ambulatory Visit (INDEPENDENT_AMBULATORY_CARE_PROVIDER_SITE_OTHER)

## 2023-12-15 DIAGNOSIS — I83813 Varicose veins of bilateral lower extremities with pain: Secondary | ICD-10-CM

## 2023-12-16 ENCOUNTER — Encounter (INDEPENDENT_AMBULATORY_CARE_PROVIDER_SITE_OTHER)

## 2024-01-06 ENCOUNTER — Ambulatory Visit (INDEPENDENT_AMBULATORY_CARE_PROVIDER_SITE_OTHER): Admitting: Vascular Surgery

## 2024-01-06 VITALS — BP 112/75 | HR 80 | Ht 66.0 in | Wt 133.5 lb

## 2024-01-06 DIAGNOSIS — I83813 Varicose veins of bilateral lower extremities with pain: Secondary | ICD-10-CM

## 2024-01-06 NOTE — Assessment & Plan Note (Signed)
 She is still bothered by painful residual varicosities particular in the right lower extremity.  Foam sclerotherapy would be an excellent treatment for these varicosities.  Risks and benefits are discussed and she is agreeable to proceed.

## 2024-01-06 NOTE — Progress Notes (Signed)
 MRN : [id number]  Jacqueline Howell is a 48 y.o. (1975/08/02) female who presents with chief complaint of  Chief Complaint  Patient presents with   4 week post left GSV laser  .  History of Present Illness: Patient returns today in follow up of her venous disease.  She has undergone laser ablation of the left great saphenous vein about a month ago with excellent results.  Her left leg is feeling much better.  She describes that is near perfect.  She is still having pain from residual varicosities on the right leg.  She has a previous history of laser ablation on this leg as well as foam sclerotherapy treatments.  The prominent residual varicosities of the right lower extremity are still causing pain.  Current Outpatient Medications  Medication Sig Dispense Refill   amphetamine -dextroamphetamine  (ADDERALL) 20 MG tablet Take 20 mg by mouth 2 (two) times daily.     LORazepam (ATIVAN) 1 MG tablet Take 1 mg by mouth daily as needed.     MULTIPLE VITAMIN PO Take 1 tablet by mouth daily.     pramipexole (MIRAPEX) 0.25 MG tablet Take 0.25 mg by mouth at bedtime as needed.     ALPRAZolam  (XANAX ) 0.5 MG tablet Take 1st tablet one hour prior to arrival and take 2nd tablet once arrived in the office (Patient not taking: Reported on 01/06/2024) 2 tablet 0   traMADol  (ULTRAM ) 50 MG tablet Take by mouth. (Patient not taking: Reported on 01/06/2024)     No current facility-administered medications for this visit.    Past Medical History:  Diagnosis Date   ADHD (attention deficit hyperactivity disorder)    Anxiety    Chronic constipation    Depression    Family history of colon cancer 04/2023   MyRisk neg   Genetic testing 04/2023   MyRisk neg; IBIS=10.6%/riskscore=6.3%   IBS (irritable bowel syndrome) 03/2018   Meniere disease    right ear   Vitamin D deficiency 05/2016    Past Surgical History:  Procedure Laterality Date   BLADDER SUSPENSION N/A 03/29/2019   Procedure: TRANSVAGINAL TAPE (TVT)  PROCEDURE;  Surgeon: Alben Alma, MD;  Location: [facility];  Service: Gynecology;  Laterality: N/A;   COLONOSCOPY  04/24/2018   repeat due in 5 yrs; Dr. Elex Grimmer REPAIR N/A 03/29/2019   Procedure: ANTERIOR REPAIR (CYSTOCELE);  Surgeon: Alben Alma, MD;  Location: [facility];  Service: Gynecology;  Laterality: N/A;   CYSTOSCOPY N/A 03/29/2019   Procedure: CYSTOSCOPY;  Surgeon: Alben Alma, MD;  Location: [facility];  Service: Gynecology;  Laterality: N/A;   DILATION AND CURETTAGE OF UTERUS     EXCISION MORTON'S NEUROMA Right 07/09/2022   Procedure: EXCISION MORTON'S NEUROMA;  Surgeon: Anell Baptist, DPM;  Location: [facility];  Service: Podiatry;  Laterality: Right;   FOOT HARDWARE REMOVAL Right 10/06/2021   screw removed   INCISION AND DRAINAGE Right 10/22/2022   Procedure: RIGHT FOOT INCISION AND DRAINAGE;  Surgeon: Pink Bridges, DPM;  Location: [facility];  Service: Podiatry;  Laterality: Right;   METATARSAL OSTEOTOMY WITH BUNIONECTOMY Right 03/24/2021     Social History   Tobacco Use   Smoking status: Never   Smokeless tobacco: Never  Vaping Use   Vaping status: Never Used  Substance Use Topics   Alcohol use: No   Drug use: No       Family History  Problem Relation Age of Onset   Thyroid disease Mother    Meniere's  disease Father    Colon cancer Father 23   Breast cancer Neg Hx      Allergies  Allergen Reactions   Biaxin [Clarithromycin] Nausea And Vomiting and Other (See Comments)    Migraines, vomiting   Omnicef [Cefdinir] Hives    She tolerates with no problem - march 2024- so there is no allergy   Oxycodone -Acetaminophen      Other Reaction(s): Hallucination   Trazodone  Other (See Comments)    Hypersomnia     REVIEW OF SYSTEMS (Negative unless checked)  Constitutional: Weight loss fever chills Cardiac: Chest pain   chest pressure   palpitations   shortness of breath when laying flat   shortness of breath at rest   shortness of breath  with exertion. Vascular: Pain in legs with walking   pain in legs at rest   pain in legs when laying flat   claudication   pain in feet when walking pain in feet at rest pain in feet when laying flat   history of DVT   phlebitis   swelling in legs   x varicose veins   non-healing ulcers Pulmonary:   Uses home oxygen   productive cough   hemoptysis   wheeze COPD   asthma Neurologic: Dizziness blackouts   seizures   history of stroke   history of TIA aphasia   temporary blindness   dysphagia   weakness or numbness in arms   weakness or numbness in legs Musculoskeletal: Arthritis   joint swelling   joint pain   low back pain Hematologic: Easy bruising easy bleeding   hypercoagulable state   anemic   Gastrointestinal: Blood in stool   vomiting blood gastroesophageal reflux/heartburn   abdominal pain Genitourinary: Chronic kidney disease   difficult urination frequent urination burning with urination   hematuria Skin: Rashes   ulcers   wounds Psychological: x history of anxiety   x history of major depression.  Physical Examination  BP 112/75   Pulse 80   Ht 5' 6 (1.676 m)   Wt 133 lb 8 oz (60.6 kg)   BMI 21.55 kg/m  Gen:  WD/WN, NAD. Appears younger than stated age. Head: [place]/AT, No temporalis wasting. Ear/Nose/Throat: Hearing grossly intact, nares w/o erythema or drainage Eyes: Conjunctiva clear. Sclera non-icteric Neck: Supple.  Trachea midline Pulmonary:  Good air movement, no use of accessory muscles.  Cardiac: RRR, no JVD Vascular:  Vessel Right Left  Radial Palpable Palpable                          PT Palpable Palpable  DP Palpable Palpable   Gastrointestinal: soft, non-tender/non-distended. No guarding/reflex.  Musculoskeletal: M/S 5/5 throughout.  No deformity or atrophy.  More prominent varicosities on the right lower extremity particularly in the medial thigh and calf area.  Scattered varicosities on the left.  No appreciable edema. Neurologic: Sensation grossly intact  in extremities.  Symmetrical.  Speech is fluent.  Psychiatric: Judgment intact, Mood & affect appropriate for pt's clinical situation. Dermatologic: No rashes or ulcers noted.  No cellulitis or open wounds.      Labs No results found for this or any previous visit (from the past 2160 hours).  Radiology VAS US  LOWER EXT VENOUS POST ABLATION Result Date: 12/16/2023  Lower Venous DVT Study Patient Name:  ROZANNA CORMANY  Date of Exam:   12/15/2023 Medical Rec #: [id number]      Accession #:    [id number] Date of  Birth: September 21, 1975      Patient Gender: F Patient Age:   9 years Exam Location:  [facility] Procedure:      VAS US  LOWER EXTREMITY VENOUS (DVT) Referring Phys: Reymundo Caulk Dakarai Mcglocklin --------------------------------------------------------------------------------  Indications: Pain, Swelling, and varicosities.  Risk Factors: Surgery Left GSV ablation 12/09/2023. Performing Technologist: Oneta Bilberry RVT  Examination Guidelines: A complete evaluation includes B-mode imaging, spectral Doppler, color Doppler, and power Doppler as needed of all accessible portions of each vessel. Bilateral testing is considered an integral part of a complete examination. Limited examinations for reoccurring indications may be performed as noted. The reflux portion of the exam is performed with the patient in reverse Trendelenburg.  Post Ablation: Follow up evaluation status post ablation of left Great Saphenous Vein on 12/09/2023.  +-----+---------------+---------+-----------+----------+--------------+ RIGHTCompressibilityPhasicitySpontaneityPropertiesThrombus Aging +-----+---------------+---------+-----------+----------+--------------+ CFV  Full           Yes      Yes                                 +-----+---------------+---------+-----------+----------+--------------+ GSV  None                               dilated                   +-----+---------------+---------+-----------+----------+--------------+   +---------+---------------+---------+-----------+----------+--------------+ LEFT     CompressibilityPhasicitySpontaneityPropertiesThrombus Aging +---------+---------------+---------+-----------+----------+--------------+ CFV      Full                                                        +---------+---------------+---------+-----------+----------+--------------+ SFJ      Full                                                        +---------+---------------+---------+-----------+----------+--------------+ FV Prox  Full                                                        +---------+---------------+---------+-----------+----------+--------------+ FV Mid   Full                                                        +---------+---------------+---------+-----------+----------+--------------+ FV DistalFull                                                        +---------+---------------+---------+-----------+----------+--------------+ GSV                              No  dilated                  +---------+---------------+---------+-----------+----------+--------------+     Summary: RIGHT: - No evidence of common femoral vein obstruction.   LEFT: - Successful ablation of left great saphenous vein.  Post Ablation Summary: - Successful ablation of the right Great Saphenous Vein from the below the knee/ proximal calf to 1.81 cm from the SFJ.  *See table(s) above for measurements and observations. Electronically signed by Mikki Alexander MD on 12/16/2023 at 12:33:17 PM.    Final     Assessment/Plan  Varicose veins of leg with pain, bilateral She is still bothered by painful residual varicosities particular in the right lower extremity.  Foam sclerotherapy would be an excellent treatment for these varicosities.  Risks and benefits are discussed and she is agreeable to proceed.    Mikki Alexander,  MD  01/06/2024 12:51 PM    This note was created with Dragon medical transcription system.  Any errors from dictation are purely unintentional

## 2024-02-03 ENCOUNTER — Telehealth (INDEPENDENT_AMBULATORY_CARE_PROVIDER_SITE_OTHER): Payer: Self-pay | Admitting: Vascular Surgery

## 2024-02-03 NOTE — Telephone Encounter (Signed)
 LVM for pt TCB and schedule FOAM sclero appt  right leg FOAM sclero. see jd. no prior auth req.

## 2024-02-13 ENCOUNTER — Telehealth (INDEPENDENT_AMBULATORY_CARE_PROVIDER_SITE_OTHER): Payer: Self-pay | Admitting: Vascular Surgery

## 2024-02-13 NOTE — Telephone Encounter (Signed)
 LVM for pt TCB and schedule FOAM sclero appt  right leg FOAM sclero. see jd. no prior auth req.

## 2024-03-08 ENCOUNTER — Encounter (INDEPENDENT_AMBULATORY_CARE_PROVIDER_SITE_OTHER): Payer: Self-pay | Admitting: Vascular Surgery

## 2024-03-08 NOTE — Telephone Encounter (Signed)
 Please bring her in for bilateral venous reflux

## 2024-03-13 ENCOUNTER — Other Ambulatory Visit (INDEPENDENT_AMBULATORY_CARE_PROVIDER_SITE_OTHER): Payer: Self-pay | Admitting: Nurse Practitioner

## 2024-03-13 ENCOUNTER — Other Ambulatory Visit (INDEPENDENT_AMBULATORY_CARE_PROVIDER_SITE_OTHER): Payer: Self-pay | Admitting: Vascular Surgery

## 2024-03-13 DIAGNOSIS — I83813 Varicose veins of bilateral lower extremities with pain: Secondary | ICD-10-CM

## 2024-03-15 ENCOUNTER — Encounter (INDEPENDENT_AMBULATORY_CARE_PROVIDER_SITE_OTHER)

## 2024-03-16 ENCOUNTER — Ambulatory Visit (INDEPENDENT_AMBULATORY_CARE_PROVIDER_SITE_OTHER)

## 2024-03-16 ENCOUNTER — Ambulatory Visit (INDEPENDENT_AMBULATORY_CARE_PROVIDER_SITE_OTHER): Admitting: Vascular Surgery

## 2024-03-16 DIAGNOSIS — I83813 Varicose veins of bilateral lower extremities with pain: Secondary | ICD-10-CM

## 2024-03-20 ENCOUNTER — Ambulatory Visit (INDEPENDENT_AMBULATORY_CARE_PROVIDER_SITE_OTHER): Admitting: Vascular Surgery

## 2024-03-20 ENCOUNTER — Encounter (INDEPENDENT_AMBULATORY_CARE_PROVIDER_SITE_OTHER): Payer: Self-pay | Admitting: Vascular Surgery

## 2024-03-20 VITALS — BP 123/79 | HR 76 | Ht 66.0 in | Wt 134.2 lb

## 2024-03-20 DIAGNOSIS — I83813 Varicose veins of bilateral lower extremities with pain: Secondary | ICD-10-CM | POA: Diagnosis not present

## 2024-03-20 NOTE — Progress Notes (Signed)
 MRN : [id number]  Jacqueline Howell is a 48 y.o. (04-Dec-1975) female who presents with chief complaint of  Chief Complaint  Patient presents with   Follow-up  .  History of Present Illness: Patient returns today in follow up of her venous disease.  Her left leg is doing great after her saphenous vein ablation earlier this year.  She continues to be bothered by pain overlying varicosities in the right medial calf and right medial thigh.  Her right great saphenous vein was ablated in 2018.  She has had some foam sclerotherapy to varicosities in the medial thigh.  Current Outpatient Medications  Medication Sig Dispense Refill   ALPRAZolam  (XANAX ) 0.5 MG tablet Take 1st tablet one hour prior to arrival and take 2nd tablet once arrived in the office (Patient not taking: Reported on 01/06/2024) 2 tablet 0   amphetamine -dextroamphetamine  (ADDERALL) 20 MG tablet Take 20 mg by mouth 2 (two) times daily.     LORazepam (ATIVAN) 1 MG tablet Take 1 mg by mouth daily as needed.     MULTIPLE VITAMIN PO Take 1 tablet by mouth daily.     pramipexole (MIRAPEX) 0.25 MG tablet Take 0.25 mg by mouth at bedtime as needed.     traMADol  (ULTRAM ) 50 MG tablet Take by mouth. (Patient not taking: Reported on 01/06/2024)     No current facility-administered medications for this visit.    Past Medical History:  Diagnosis Date   ADHD (attention deficit hyperactivity disorder)    Anxiety    Chronic constipation    Depression    Family history of colon cancer 04/2023   MyRisk neg   Genetic testing 04/2023   MyRisk neg; IBIS=10.6%/riskscore=6.3%   IBS (irritable bowel syndrome) 03/2018   Meniere disease    right ear   Vitamin D deficiency 05/2016    Past Surgical History:  Procedure Laterality Date   BLADDER SUSPENSION N/A 03/29/2019   Procedure: TRANSVAGINAL TAPE (TVT) PROCEDURE;  Surgeon: Arloa Lamar SQUIBB, MD;  Location: [facility];  Service: Gynecology;  Laterality: N/A;   COLONOSCOPY  04/24/2018   repeat  due in 5 yrs; Dr. Sherrill MASH REPAIR N/A 03/29/2019   Procedure: ANTERIOR REPAIR (CYSTOCELE);  Surgeon: Arloa Lamar SQUIBB, MD;  Location: [facility];  Service: Gynecology;  Laterality: N/A;   CYSTOSCOPY N/A 03/29/2019   Procedure: CYSTOSCOPY;  Surgeon: Arloa Lamar SQUIBB, MD;  Location: [facility];  Service: Gynecology;  Laterality: N/A;   DILATION AND CURETTAGE OF UTERUS     EXCISION MORTON'S NEUROMA Right 07/09/2022   Procedure: EXCISION MORTON'S NEUROMA;  Surgeon: Ashley Soulier, DPM;  Location: [facility];  Service: Podiatry;  Laterality: Right;   FOOT HARDWARE REMOVAL Right 10/06/2021   screw removed   INCISION AND DRAINAGE Right 10/22/2022   Procedure: RIGHT FOOT INCISION AND DRAINAGE;  Surgeon: Lennie Barter, DPM;  Location: [facility];  Service: Podiatry;  Laterality: Right;   METATARSAL OSTEOTOMY WITH BUNIONECTOMY Right 03/24/2021     Social History   Tobacco Use   Smoking status: Never   Smokeless tobacco: Never  Vaping Use   Vaping status: Never Used  Substance Use Topics   Alcohol use: No   Drug use: No      Family History  Problem Relation Age of Onset   Thyroid disease Mother    Meniere's disease Father    Colon cancer Father 37   Breast cancer Neg Hx      Allergies  Allergen Reactions   Biaxin [Clarithromycin]  Nausea And Vomiting and Other (See Comments)    Migraines, vomiting   Omnicef [Cefdinir] Hives    She tolerates with no problem - march 2024- so there is no allergy   Oxycodone -Acetaminophen      Other Reaction(s): Hallucination   Trazodone  Other (See Comments)    Hypersomnia     REVIEW OF SYSTEMS (Negative unless checked)  Constitutional: [] Weight loss  [] Fever  [] Chills Cardiac: [] Chest pain   [] Chest pressure   [] Palpitations   [] Shortness of breath when laying flat   [] Shortness of breath at rest   [] Shortness of breath with exertion. Vascular:  [] Pain in legs with walking   [] Pain in legs at rest   [] Pain in legs when laying flat    [] Claudication   [] Pain in feet when walking  [] Pain in feet at rest  [] Pain in feet when laying flat   [] History of DVT   [] Phlebitis   [] Swelling in legs   [x] Varicose veins   [] Non-healing ulcers Pulmonary:   [] Uses home oxygen   [] Productive cough   [] Hemoptysis   [] Wheeze  [] COPD   [] Asthma Neurologic:  [] Dizziness  [] Blackouts   [] Seizures   [] History of stroke   [] History of TIA  [] Aphasia   [] Temporary blindness   [] Dysphagia   [] Weakness or numbness in arms   [] Weakness or numbness in legs Musculoskeletal:  [] Arthritis   [] Joint swelling   [] Joint pain   [] Low back pain Hematologic:  [] Easy bruising  [] Easy bleeding   [] Hypercoagulable state   [] Anemic   Gastrointestinal:  [] Blood in stool   [] Vomiting blood  [] Gastroesophageal reflux/heartburn   [] Abdominal pain Genitourinary:  [] Chronic kidney disease   [] Difficult urination  [] Frequent urination  [] Burning with urination   [] Hematuria Skin:  [] Rashes   [] Ulcers   [] Wounds Psychological:  [] History of anxiety   []  History of major depression.  Physical Examination  BP 123/79   Pulse 76   Ht 5' 6 (1.676 m)   Wt 134 lb 4 oz (60.9 kg)   BMI 21.67 kg/m  Gen:  WD/WN, NAD Head: [place]/AT, No temporalis wasting. Ear/Nose/Throat: Hearing grossly intact, nares w/o erythema or drainage Eyes: Conjunctiva clear. Sclera non-icteric Neck: Supple.  Trachea midline Pulmonary:  Good air movement, no use of accessory muscles.  Cardiac: RRR, no JVD Vascular:  Vessel Right Left  Radial Palpable Palpable                          PT Palpable Palpable  DP Palpable Palpable   Gastrointestinal: soft, non-tender/non-distended. No guarding/reflex.  Musculoskeletal: M/S 5/5 throughout.  No deformity or atrophy. Prominent varicosities in the medial knee area. No edema. Neurologic: Sensation grossly intact in extremities.  Symmetrical.  Speech is fluent.  Psychiatric: Judgment intact, Mood & affect appropriate for pt's clinical  situation. Dermatologic: No rashes or ulcers noted.  No cellulitis or open wounds.      Labs No results found for this or any previous visit (from the past 2160 hours).  Radiology VAS US  LOWER EXTREMITY VENOUS REFLUX Result Date: 03/20/2024  Lower Venous Reflux Study Patient Name:  Jacqueline Howell  Date of Exam:   03/16/2024 Medical Rec #: [id number]      Accession #:    [id number] Date of Birth: 12/04/1975      Patient Gender: F Patient Age:   71 years Exam Location:  [facility] Procedure:      VAS US  LOWER EXTREMITY VENOUS REFLUX  Referring Phys: ORVIN DARING --------------------------------------------------------------------------------  Indications: Varicosities, and right knee area treated sclero.  Performing Technologist: Jerel Croak RVT  Examination Guidelines: A complete evaluation includes B-mode imaging, spectral Doppler, color Doppler, and power Doppler as needed of all accessible portions of each vessel. Bilateral testing is considered an integral part of a complete examination. Limited examinations for reoccurring indications may be performed as noted. The reflux portion of the exam is performed with the patient in reverse Trendelenburg. Significant venous reflux is defined as >500 ms in the superficial venous system, and >1 second in the deep venous system.   Summary: Right: - No evidence of deep vein thrombosis seen in the right lower extremity, from the common femoral through the popliteal veins. - No evidence of superficial venous thrombosis in the right lower extremity. - There is no evidence of venous reflux seen in the right lower extremity. - No evidence of superficial venous reflux seen in the right short saphenous vein. - Rt GSV closed by ablation 2018.  *See table(s) above for measurements and observations. Electronically signed by Selinda Gu MD on 03/20/2024 at 2:22:57 PM.    Final     Assessment/Plan  Varicose veins of leg with pain, bilateral She does not have  recurrent reflux in the right lower extremity and there is no evidence of DVT or superficial thrombophlebitis.  I think treating the prominent varicosities with further foam sclerotherapy at this point would be reasonable.  Patient desires to proceed.    Selinda Gu, MD  03/20/2024 3:15 PM    This note was created with Dragon medical transcription system.  Any errors from dictation are purely unintentional

## 2024-03-20 NOTE — Assessment & Plan Note (Signed)
 She does not have recurrent reflux in the right lower extremity and there is no evidence of DVT or superficial thrombophlebitis.  I think treating the prominent varicosities with further foam sclerotherapy at this point would be reasonable.  Patient desires to proceed.

## 2024-04-08 NOTE — Progress Notes (Signed)
Indication:  Patient presents with symptomatic varicose veins of the right lower extremity.  Procedure:  Sclerotherapy using hypertonic saline mixed with 1% Lidocaine was performed on the right lower extremity.  Compression wraps were placed.  The patient tolerated the procedure well.  Plan:  Follow up as needed.   

## 2024-04-09 ENCOUNTER — Ambulatory Visit (INDEPENDENT_AMBULATORY_CARE_PROVIDER_SITE_OTHER): Admitting: Vascular Surgery

## 2024-04-09 VITALS — BP 124/81 | HR 63 | Ht 66.0 in | Wt 135.2 lb

## 2024-04-09 DIAGNOSIS — I83813 Varicose veins of bilateral lower extremities with pain: Secondary | ICD-10-CM

## 2024-04-21 ENCOUNTER — Encounter (INDEPENDENT_AMBULATORY_CARE_PROVIDER_SITE_OTHER): Payer: Self-pay | Admitting: Vascular Surgery

## 2024-05-10 ENCOUNTER — Encounter: Payer: Self-pay | Admitting: Obstetrics and Gynecology

## 2024-05-17 ENCOUNTER — Encounter: Payer: Self-pay | Admitting: Obstetrics and Gynecology

## 2024-05-27 NOTE — Progress Notes (Unsigned)
 PCP:  Cleotilde Oneil FALCON, MD   No chief complaint on file.    HPI:      Jacqueline Howell is a 48 y.o. [id number] who LMP was No LMP recorded., presents today for her annual examination.  Her menses are regular every 28-30 days, lasting 3-4 days, mod flow.  Dysmenorrhea mild (used to be moderate), improved with tylenol .  She does not have intermenstrual bleeding. No vasomotor sx.   S/p anterior colporrhaphy and sling for urinary incontinence 9/20 with Dr. Arloa. Sx improved.   Sex activity: single partner, contraception - vasectomy.  No pain/bleeding/dryness. Last Pap: 05/02/23 Results were: ASCUS/neg HPV DNA  Hx of STDs: none  Last mammogram: 06/15/23 Results were: normal--routine follow-up in 12 months There is no FH of breast cancer. There is no FH of ovarian cancer. The patient does not do self-breast exams. There is a FH of colon cancer in her dad at age 32.  Genetic testing not done.   Colonoscopy: 11/24 at [facility]; repeat due after ??? Yrs;  done 2019 for IBS sx with Dr. Sheikh--constipation/FH. Repeat due after 5 yrs due to FH. Chief Financial Officer but needs [facility].   Tobacco use: The patient denies current or previous tobacco use. Alcohol use: none No drug use.  Exercise: mod active  She does get adequate calcium but not Vitamin D in her diet.  Labs with PCP.    Past Medical History:  Diagnosis Date   ADHD (attention deficit hyperactivity disorder)    Anxiety    Chronic constipation    Depression    Family history of colon cancer 04/2023   MyRisk neg   Genetic testing 04/2023   MyRisk neg; IBIS=10.6%/riskscore=6.3%   IBS (irritable bowel syndrome) 03/2018   Meniere disease    right ear   Vitamin D deficiency 05/2016    Past Surgical History:  Procedure Laterality Date   BLADDER SUSPENSION N/A 03/29/2019   Procedure: TRANSVAGINAL TAPE (TVT) PROCEDURE;  Surgeon: Arloa Lamar SQUIBB, MD;  Location: [facility] ORS;  Service: Gynecology;  Laterality: N/A;   COLONOSCOPY   04/24/2018   repeat due in 5 yrs; Dr. Sherrill MASH REPAIR N/A 03/29/2019   Procedure: ANTERIOR REPAIR (CYSTOCELE);  Surgeon: Arloa Lamar SQUIBB, MD;  Location: [facility] OR[facility];  Service: Gynecology;  Laterality: N/A;   CYSTOSCOPY N/A 03/29/2019   Procedure: CYSTOSCOPY;  Surgeon: Arloa Lamar SQUIBB, MD;  Location: [facility] OR[facility];  Service: Gynecology;  Laterality: N/A;   DILATION AND CURETTAGE OF UTERUS     EXCISION MORTON'S NEUROMA Right 07/09/2022   Procedure: EXCISION MORTON'S NEUROMA;  Surgeon: Ashley Soulier, DPM;  Location: [facility] ORS;  Service: Podiatry;  Laterality: Right;   FOOT HARDWARE REMOVAL Right 10/06/2021   screw removed   INCISION AND DRAINAGE Right 10/22/2022   Procedure: RIGHT FOOT INCISION AND DRAINAGE;  Surgeon: Lennie Barter, DPM;  Location: [facility] ORS;  Service: Podiatry;  Laterality: Right;   METATARSAL OSTEOTOMY WITH BUNIONECTOMY Right 03/24/2021    Family History  Problem Relation Age of Onset   Thyroid disease Mother    Meniere's disease Father    Prostate cancer Father 75   Breast cancer Neg Hx     Social History   Socioeconomic History   Marital status: Married    Spouse name: Jacques   Number of children: 4   Years of education: Not on file   Highest education level: Not on file  Occupational History   Not on file  Tobacco Use  Smoking status: Never   Smokeless tobacco: Never  Vaping Use   Vaping status: Never Used  Substance and Sexual Activity   Alcohol use: No   Drug use: No   Sexual activity: Yes    Comment: Vasectomy  Other Topics Concern   Not on file  Social History Narrative   Not on file   Social Drivers of Health   Financial Resource Strain: Low Risk  (05/27/2023)   Received from [facility]   Overall Financial Resource Strain (CARDIA)    Difficulty of Paying Living Expenses: Not hard at all  Food Insecurity: No Food Insecurity (05/27/2023)   Received from [facility]   Hunger Vital Sign    Within the  past 12 months, you worried that your food would run out before you got the money to buy more.: Never true    Within the past 12 months, the food you bought just didn't last and you didn't have money to get more.: Never true  Transportation Needs: No Transportation Needs (05/27/2023)   Received from [facility] - Transportation    In the past 12 months, has lack of transportation kept you from medical appointments or from getting medications?: No    Lack of Transportation (Non-Medical): No  Physical Activity: Not on file  Stress: Not on file  Social Connections: Not on file  Intimate Partner Violence: Not At Risk (10/21/2022)   Humiliation, Afraid, Rape, and Kick questionnaire    Fear of Current or Ex-Partner: No    Emotionally Abused: No    Physically Abused: No    Sexually Abused: No    Outpatient Medications Prior to Visit  Medication Sig Dispense Refill   ALPRAZolam  (XANAX ) 0.5 MG tablet Take 1st tablet one hour prior to arrival and take 2nd tablet once arrived in the office (Patient not taking: Reported on 01/06/2024) 2 tablet 0   amphetamine -dextroamphetamine  (ADDERALL) 20 MG tablet Take 20 mg by mouth 2 (two) times daily.     LORazepam (ATIVAN) 1 MG tablet Take 1 mg by mouth daily as needed.     MULTIPLE VITAMIN PO Take 1 tablet by mouth daily.     pramipexole (MIRAPEX) 0.25 MG tablet Take 0.25 mg by mouth at bedtime as needed.     traMADol  (ULTRAM ) 50 MG tablet Take by mouth. (Patient not taking: Reported on 01/06/2024)     No facility-administered medications prior to visit.      ROS:  Review of Systems  Constitutional:  Negative for fatigue, fever and unexpected weight change.  Respiratory:  Negative for cough, shortness of breath and wheezing.   Cardiovascular:  Negative for chest pain, palpitations and leg swelling.  Gastrointestinal:  Positive for constipation. Negative for blood in stool, diarrhea, nausea and vomiting.  Endocrine: Negative  for cold intolerance, heat intolerance and polyuria.  Genitourinary:  Negative for dyspareunia, dysuria, flank pain, frequency, genital sores, hematuria, menstrual problem, pelvic pain, urgency, vaginal bleeding, vaginal discharge and vaginal pain.  Musculoskeletal:  Negative for back pain, joint swelling and myalgias.  Skin:  Negative for rash.  Neurological:  Negative for dizziness, syncope, light-headedness, numbness and headaches.  Hematological:  Negative for adenopathy.  Psychiatric/Behavioral:  Negative for agitation, confusion, sleep disturbance and suicidal ideas. The patient is not nervous/anxious.   BREAST: No symptoms   Objective: There were no vitals taken for this visit.   Physical Exam Constitutional:      Appearance: She is well-developed.  Genitourinary:  Vulva normal.     Right Labia: No rash, tenderness or lesions.    Left Labia: No tenderness, lesions or rash.    No vaginal discharge, erythema or tenderness.      Right Adnexa: not tender and no mass present.    Left Adnexa: not tender and no mass present.    No cervical motion tenderness, friability or polyp.     Uterus is not enlarged or tender.  Breasts:    Right: No mass, nipple discharge, skin change or tenderness.     Left: No mass, nipple discharge, skin change or tenderness.  Neck:     Thyroid: No thyromegaly.  Cardiovascular:     Rate and Rhythm: Normal rate and regular rhythm.     Heart sounds: Normal heart sounds. No murmur heard. Pulmonary:     Effort: Pulmonary effort is normal.     Breath sounds: Normal breath sounds.  Abdominal:     Palpations: Abdomen is soft.     Tenderness: There is no abdominal tenderness. There is no guarding or rebound.  Musculoskeletal:        General: Normal range of motion.     Cervical back: Normal range of motion.  Lymphadenopathy:     Cervical: No cervical adenopathy.  Neurological:     General: No focal deficit present.     Mental Status: She is alert  and oriented to person, place, and time.     Cranial Nerves: No cranial nerve deficit.  Skin:    General: Skin is warm and dry.  Psychiatric:        Mood and Affect: Mood normal.        Behavior: Behavior normal.        Thought Content: Thought content normal.        Judgment: Judgment normal.  Vitals reviewed.     Assessment/Plan: Encounter for annual routine gynecological examination  Cervical cancer screening - Plan: Cytology - PAP  Screening for HPV (human papillomavirus) - Plan: Cytology - PAP  Encounter for screening mammogram for malignant neoplasm of breast - Plan: MM 3D SCREENING MAMMOGRAM BILATERAL BREAST; pt to schedule mammo  Screening for colon cancer - Plan: Integrated [facility]AC[facility] ([facility]), Ambulatory referral to Gastroenterology; MyRisk testing discussed and done today. Will f/u with results.  Family history of colon cancer in father - Plan: Ambulatory referral to Gastroenterology; refer to [facility] due to insurance.            GYN counsel breast self exam, mammography screening, adequate intake of calcium and vitamin D, diet and exercise     F/U  No follow-ups on file.  Jacqueline Fons B. Wandell Scullion, PA-C 05/27/2024 7:05 PM

## 2024-05-29 ENCOUNTER — Ambulatory Visit: Admitting: Obstetrics and Gynecology

## 2024-05-29 ENCOUNTER — Other Ambulatory Visit (HOSPITAL_COMMUNITY)
Admission: RE | Admit: 2024-05-29 | Discharge: 2024-05-29 | Disposition: A | Discharge status: Home / Self Care | Source: Ambulatory Visit | Attending: Obstetrics and Gynecology | Admitting: Obstetrics and Gynecology

## 2024-05-29 ENCOUNTER — Encounter: Payer: Self-pay | Admitting: Obstetrics and Gynecology

## 2024-05-29 VITALS — BP 124/79 | HR 67 | Ht 66.0 in | Wt 135.0 lb

## 2024-05-29 DIAGNOSIS — Z01419 Encounter for gynecological examination (general) (routine) without abnormal findings: Secondary | ICD-10-CM

## 2024-05-29 DIAGNOSIS — Z01411 Encounter for gynecological examination (general) (routine) with abnormal findings: Secondary | ICD-10-CM | POA: Diagnosis not present

## 2024-05-29 DIAGNOSIS — Z1151 Encounter for screening for human papillomavirus (HPV): Secondary | ICD-10-CM | POA: Diagnosis present

## 2024-05-29 DIAGNOSIS — R87610 Atypical squamous cells of undetermined significance on cytologic smear of cervix (ASC-US): Secondary | ICD-10-CM | POA: Diagnosis present

## 2024-05-29 DIAGNOSIS — Z1231 Encounter for screening mammogram for malignant neoplasm of breast: Secondary | ICD-10-CM

## 2024-05-29 DIAGNOSIS — Z1211 Encounter for screening for malignant neoplasm of colon: Secondary | ICD-10-CM

## 2024-05-29 DIAGNOSIS — N951 Menopausal and female climacteric states: Secondary | ICD-10-CM | POA: Diagnosis not present

## 2024-05-29 DIAGNOSIS — Z124 Encounter for screening for malignant neoplasm of cervix: Secondary | ICD-10-CM | POA: Diagnosis present

## 2024-05-29 MED ORDER — PROGESTERONE MICRONIZED 100 MG PO CAPS: ORAL_CAPSULE | ORAL | 0 refills | Status: DC

## 2024-05-29 NOTE — Patient Instructions (Addendum)
 I value your feedback and you entrusting [place] with your care. If you get a [facility] patient survey, I would appreciate you taking the time to let us know about your experience today. Thank you!  [facility] ([place])--[phone]

## 2024-05-31 ENCOUNTER — Ambulatory Visit: Payer: Self-pay | Admitting: Obstetrics and Gynecology

## 2024-05-31 LAB — CYTOLOGY - PAP
Comment: NEGATIVE
Diagnosis: UNDETERMINED — AB
High risk HPV: NEGATIVE

## 2024-06-30 ENCOUNTER — Telehealth

## 2024-06-30 DIAGNOSIS — H6012 Cellulitis of left external ear: Secondary | ICD-10-CM

## 2024-06-30 MED ORDER — MUPIROCIN 2 % EX OINT: 1.0000 | TOPICAL_OINTMENT | Freq: Two times a day (BID) | CUTANEOUS | 0 refills | Status: AC

## 2024-06-30 MED ORDER — SULFAMETHOXAZOLE-TRIMETHOPRIM 800-160 MG PO TABS: 1.0000 | ORAL_TABLET | Freq: Two times a day (BID) | ORAL | 0 refills | Status: AC

## 2024-06-30 NOTE — Progress Notes (Signed)
 Virtual Visit Consent   Jacqueline Howell, you are scheduled for a virtual visit with a [facility] provider today. Just as with appointments in the office, your consent must be obtained to participate. Your consent will be active for this visit and any virtual visit you may have with one of our providers in the next 365 days. If you have a MyChart account, a copy of this consent can be sent to you electronically.  As this is a virtual visit, video technology does not allow for your provider to perform a traditional examination. This may limit your provider's ability to fully assess your condition. If your provider identifies any concerns that need to be evaluated in person or the need to arrange testing (such as labs, EKG, etc.), we will make arrangements to do so. Although advances in technology are sophisticated, we cannot ensure that it will always work on either your end or our end. If the connection with a video visit is poor, the visit may have to be switched to a telephone visit. With either a video or telephone visit, we are not always able to ensure that we have a secure connection.  By engaging in this virtual visit, you consent to the provision of healthcare and authorize for your insurance to be billed (if applicable) for the services provided during this visit. Depending on your insurance coverage, you may receive a charge related to this service.  I need to obtain your verbal consent now. Are you willing to proceed with your visit today? Jacqueline Howell has provided verbal consent on 06/30/2024 for a virtual visit (video or telephone). Jacqueline LELON Servant, NP  Date: 06/30/2024 8:42 AM   Virtual Visit via Video Note   I, Jacqueline Howell, connected with  Jacqueline Howell  ([id number], Jan 23, 1976) on 06/30/24 at  8:30 AM EST by a video-enabled telemedicine application and verified that I am speaking with the correct person using two identifiers.  Location: Patient: Virtual Visit Location Patient:  Home Provider: Virtual Visit Location Provider: Home Office   I discussed the limitations of evaluation and management by telemedicine and the availability of in person appointments. The patient expressed understanding and agreed to proceed.    History of Present Illness: Jacqueline Howell is a 48 y.o. who identifies as a female who was assigned female at birth, and is being seen today for left ear pain.  [name] has concerns of left ear redness, warmth, and pain. She woke up with a small nodule on the outer ear lobe that has increased in size today along with the above mentioned symptoms. She denies fever. There are no symptoms of periorbital cellulitis. Hearing is not affected.    Problems:  Patient Active Problem List   Diagnosis Date Noted   Varicose veins of leg with pain, bilateral 12/09/2023   Cellulitis and abscess of toe of right foot 10/21/2022   Osteomyelitis of fourth toe of right foot (HCC) 10/21/2022   Meniere's disease of right ear 11/26/2021   Bacterial vaginosis 06/25/2020   Lumbar disc disease 02/26/2020   Chronic constipation 02/26/2020   Urinary incontinence 03/29/2019   Chronic idiopathic constipation 11/15/2017   Family history of colon cancer 11/15/2017   Intractable menstrual migraine without status migrainosus 11/15/2017   Dysmenorrhea 11/15/2017   Chronic venous insufficiency 12/29/2016   Varicose veins of right lower extremity 12/29/2016   Depression 12/21/2016   Low back pain 06/03/2015   ADHD (attention deficit hyperactivity disorder) 01/13/2015   Chronic  anxiety 01/13/2015    Allergies:  Allergies  Allergen Reactions   Biaxin [Clarithromycin] Nausea And Vomiting and Other (See Comments)    Migraines, vomiting   Omnicef [Cefdinir] Hives    She tolerates with no problem - march 2024- so there is no allergy   Oxycodone -Acetaminophen      Other Reaction(s): Hallucination   Trazodone  Other (See Comments)    Hypersomnia   Medications:  Current  Outpatient Medications:    mupirocin  ointment (BACTROBAN ) 2 %, Apply 1 Application topically 2 (two) times daily. Apply to left outer ear, Disp: 30 g, Rfl: 0   sulfamethoxazole -trimethoprim  (BACTRIM  DS) 800-160 MG tablet, Take 1 tablet by mouth 2 (two) times daily for 7 days., Disp: 14 tablet, Rfl: 0   amphetamine -dextroamphetamine  (ADDERALL) 20 MG tablet, Take 20 mg by mouth 2 (two) times daily., Disp: , Rfl:    AUVELITY 45-105 MG TBCR, Take by mouth., Disp: , Rfl:    gentamicin ointment (GARAMYCIN) 0.1 %, Apply topically., Disp: , Rfl:    LORazepam (ATIVAN) 1 MG tablet, Take 1 mg by mouth daily as needed., Disp: , Rfl:    MULTIPLE VITAMIN PO, Take 1 tablet by mouth daily., Disp: , Rfl:    pramipexole (MIRAPEX) 0.25 MG tablet, Take 0.25 mg by mouth at bedtime as needed., Disp: , Rfl:    progesterone  (PROMETRIUM ) 100 MG capsule, Take 1 cap nightly, Disp: 90 capsule, Rfl: 0   traMADol  (ULTRAM ) 50 MG tablet, Take 50 mg by mouth., Disp: , Rfl:    traZODone  (DESYREL ) 50 MG tablet, Take 50 mg by mouth at bedtime., Disp: , Rfl:   Observations/Objective: Patient is well-developed, well-nourished in no acute distress.  Resting comfortably at home.  Head is normocephalic, atraumatic.  No labored breathing.  Speech is clear and coherent with logical content.  Patient is alert and oriented at baseline.    Assessment and Plan: 1. Cellulitis of left ear (Primary) - mupirocin  ointment (BACTROBAN ) 2 %; Apply 1 Application topically 2 (two) times daily. Apply to left outer ear  Dispense: 30 g; Refill: 0 - sulfamethoxazole -trimethoprim  (BACTRIM  DS) 800-160 MG tablet; Take 1 tablet by mouth 2 (two) times daily for 7 days.  Dispense: 14 tablet; Refill: 0   May apply warm compresses to ear for relief of pain and swelling.  Follow Up Instructions: I discussed the assessment and treatment plan with the patient. The patient was provided an opportunity to ask questions and all were answered. The patient agreed  with the plan and demonstrated an understanding of the instructions.  A copy of instructions were sent to the patient via MyChart unless otherwise noted below.    The patient was advised to call back or seek an in-person evaluation if the symptoms worsen or if the condition fails to improve as anticipated.    Jamile Rekowski W Wetzel Meester, NP

## 2024-06-30 NOTE — Patient Instructions (Addendum)
 Darice FORBES Herald, thank you for joining Haze LELON Servant, NP for today's virtual visit.  While this provider is not your primary care provider (PCP), if your PCP is located in our provider database this encounter information will be shared with them immediately following your visit.   A [facility] MyChart account gives you access to today's visit and all your visits, tests, and labs performed at [facility]  click here if you don't have a [facility] MyChart account or go to [email or web address]  Consent: (Patient) Jacqueline Howell provided verbal consent for this virtual visit at the beginning of the encounter.  Current Medications:  Current Outpatient Medications:    mupirocin  ointment (BACTROBAN ) 2 %, Apply 1 Application topically 2 (two) times daily. Apply to left outer ear, Disp: 30 g, Rfl: 0   sulfamethoxazole -trimethoprim  (BACTRIM  DS) 800-160 MG tablet, Take 1 tablet by mouth 2 (two) times daily for 7 days., Disp: 14 tablet, Rfl: 0   amphetamine -dextroamphetamine  (ADDERALL) 20 MG tablet, Take 20 mg by mouth 2 (two) times daily., Disp: , Rfl:    AUVELITY 45-105 MG TBCR, Take by mouth., Disp: , Rfl:    gentamicin ointment (GARAMYCIN) 0.1 %, Apply topically., Disp: , Rfl:    LORazepam (ATIVAN) 1 MG tablet, Take 1 mg by mouth daily as needed., Disp: , Rfl:    MULTIPLE VITAMIN PO, Take 1 tablet by mouth daily., Disp: , Rfl:    pramipexole (MIRAPEX) 0.25 MG tablet, Take 0.25 mg by mouth at bedtime as needed., Disp: , Rfl:    progesterone  (PROMETRIUM ) 100 MG capsule, Take 1 cap nightly, Disp: 90 capsule, Rfl: 0   traMADol  (ULTRAM ) 50 MG tablet, Take 50 mg by mouth., Disp: , Rfl:    traZODone  (DESYREL ) 50 MG tablet, Take 50 mg by mouth at bedtime., Disp: , Rfl:    Medications ordered in this encounter:  Meds ordered this encounter  Medications   mupirocin  ointment (BACTROBAN ) 2 %    Sig: Apply 1 Application topically 2 (two) times daily. Apply to left outer ear    Dispense:   30 g    Refill:  0    Supervising Provider:   BLAISE ALEENE KIDD [[id number]]   sulfamethoxazole -trimethoprim  (BACTRIM  DS) 800-160 MG tablet    Sig: Take 1 tablet by mouth 2 (two) times daily for 7 days.    Dispense:  14 tablet    Refill:  0    Supervising Provider:   BLAISE ALEENE KIDD [[id number]]     *If you need refills on other medications prior to your next appointment, please contact your pharmacy*  Follow-Up: Call back or seek an in-person evaluation if the symptoms worsen or if the condition fails to improve as anticipated.  [facility] [phone]  Other Instructions May apply warm compresses to ear for relief of pain and swelling.    If you have been instructed to have an in-person evaluation today at a local Urgent Care facility, please use the link below. It will take you to a list of all of our available [facility], including address, phone number and hours of operation. Please do not delay care.  [facility]  If you or a family member do not have a primary care provider, use the link below to schedule a visit and establish care. When you choose a [facility] primary care physician or advanced practice provider, you gain a long-term partner in health. Find a Primary Care Provider  Learn  more about [facility]'s in-office and virtual care options: [facility] - Get Care Now

## 2024-07-11 ENCOUNTER — Encounter: Payer: Self-pay | Admitting: Nurse Practitioner

## 2024-07-13 ENCOUNTER — Other Ambulatory Visit: Payer: Self-pay | Admitting: Nurse Practitioner

## 2024-07-13 MED ORDER — FLUCONAZOLE 150 MG PO TABS: 150.0000 mg | ORAL_TABLET | Freq: Once | ORAL | 0 refills | Status: AC

## 2024-08-16 ENCOUNTER — Other Ambulatory Visit: Payer: Self-pay | Admitting: Obstetrics and Gynecology

## 2024-08-16 DIAGNOSIS — N951 Menopausal and female climacteric states: Secondary | ICD-10-CM

## 2024-08-20 ENCOUNTER — Encounter: Payer: Self-pay | Admitting: Obstetrics and Gynecology

## 2024-08-20 DIAGNOSIS — N951 Menopausal and female climacteric states: Secondary | ICD-10-CM

## 2024-08-22 MED ORDER — PROGESTERONE MICRONIZED 100 MG PO CAPS: ORAL_CAPSULE | ORAL | 2 refills | Status: AC

## 2024-08-22 NOTE — Telephone Encounter (Signed)
 I sent in RF. Pt doesn't need appt with me since doing well and I can just refill meds. Thx.
